# Patient Record
Sex: Female | Born: 1950 | Race: Black or African American | Hispanic: No | Marital: Single | Smoking: Former smoker
Health system: Southern US, Community
[De-identification: ages and names within clinical notes are randomized; demographics above are authoritative.]

## PROBLEM LIST (undated history)

## (undated) DIAGNOSIS — R0602 Shortness of breath: Secondary | ICD-10-CM

## (undated) DIAGNOSIS — Z87440 Personal history of urinary (tract) infections: Secondary | ICD-10-CM

## (undated) DIAGNOSIS — Z950 Presence of cardiac pacemaker: Secondary | ICD-10-CM

## (undated) DIAGNOSIS — J449 Chronic obstructive pulmonary disease, unspecified: Secondary | ICD-10-CM

## (undated) DIAGNOSIS — R42 Dizziness and giddiness: Secondary | ICD-10-CM

## (undated) DIAGNOSIS — I509 Heart failure, unspecified: Secondary | ICD-10-CM

## (undated) DIAGNOSIS — I10 Essential (primary) hypertension: Secondary | ICD-10-CM

## (undated) DIAGNOSIS — M545 Low back pain, unspecified: Secondary | ICD-10-CM

## (undated) DIAGNOSIS — G8929 Other chronic pain: Secondary | ICD-10-CM

## (undated) DIAGNOSIS — Z531 Procedure and treatment not carried out because of patient's decision for reasons of belief and group pressure: Secondary | ICD-10-CM

## (undated) DIAGNOSIS — R51 Headache: Secondary | ICD-10-CM

## (undated) DIAGNOSIS — K5900 Constipation, unspecified: Secondary | ICD-10-CM

## (undated) DIAGNOSIS — J42 Unspecified chronic bronchitis: Secondary | ICD-10-CM

## (undated) DIAGNOSIS — I499 Cardiac arrhythmia, unspecified: Secondary | ICD-10-CM

## (undated) DIAGNOSIS — IMO0001 Reserved for inherently not codable concepts without codable children: Secondary | ICD-10-CM

## (undated) DIAGNOSIS — Z95810 Presence of automatic (implantable) cardiac defibrillator: Secondary | ICD-10-CM

## (undated) DIAGNOSIS — M1990 Unspecified osteoarthritis, unspecified site: Secondary | ICD-10-CM

## (undated) DIAGNOSIS — I209 Angina pectoris, unspecified: Secondary | ICD-10-CM

## (undated) HISTORY — PX: TUBAL LIGATION: SHX77

## (undated) HISTORY — PX: JOINT REPLACEMENT: SHX530

---

## 1989-04-28 HISTORY — PX: REPLACEMENT TOTAL KNEE: SUR1224

## 2007-05-29 HISTORY — PX: INSERT / REPLACE / REMOVE PACEMAKER: SUR710

## 2011-09-12 ENCOUNTER — Encounter (HOSPITAL_COMMUNITY): Payer: Self-pay | Admitting: *Deleted

## 2011-09-12 ENCOUNTER — Emergency Department (HOSPITAL_COMMUNITY): Payer: Medicaid Other

## 2011-09-12 ENCOUNTER — Other Ambulatory Visit: Payer: Self-pay

## 2011-09-12 ENCOUNTER — Inpatient Hospital Stay (HOSPITAL_COMMUNITY)
Admission: EM | Admit: 2011-09-12 | Discharge: 2011-09-14 | DRG: 552 | Disposition: A | Discharge status: Home / Self Care | Payer: Medicaid Other | Attending: Infectious Diseases | Admitting: Infectious Diseases

## 2011-09-12 DIAGNOSIS — N179 Acute kidney failure, unspecified: Secondary | ICD-10-CM | POA: Diagnosis present

## 2011-09-12 DIAGNOSIS — J449 Chronic obstructive pulmonary disease, unspecified: Secondary | ICD-10-CM

## 2011-09-12 DIAGNOSIS — I420 Dilated cardiomyopathy: Secondary | ICD-10-CM

## 2011-09-12 DIAGNOSIS — G8929 Other chronic pain: Secondary | ICD-10-CM | POA: Diagnosis present

## 2011-09-12 DIAGNOSIS — M109 Gout, unspecified: Secondary | ICD-10-CM | POA: Diagnosis present

## 2011-09-12 DIAGNOSIS — I499 Cardiac arrhythmia, unspecified: Secondary | ICD-10-CM

## 2011-09-12 DIAGNOSIS — Z79899 Other long term (current) drug therapy: Secondary | ICD-10-CM

## 2011-09-12 DIAGNOSIS — Z950 Presence of cardiac pacemaker: Secondary | ICD-10-CM

## 2011-09-12 DIAGNOSIS — M545 Low back pain, unspecified: Secondary | ICD-10-CM | POA: Diagnosis present

## 2011-09-12 DIAGNOSIS — M5380 Other specified dorsopathies, site unspecified: Principal | ICD-10-CM | POA: Diagnosis present

## 2011-09-12 DIAGNOSIS — R079 Chest pain, unspecified: Secondary | ICD-10-CM | POA: Diagnosis present

## 2011-09-12 DIAGNOSIS — M542 Cervicalgia: Secondary | ICD-10-CM | POA: Diagnosis present

## 2011-09-12 DIAGNOSIS — Z96659 Presence of unspecified artificial knee joint: Secondary | ICD-10-CM

## 2011-09-12 DIAGNOSIS — E860 Dehydration: Secondary | ICD-10-CM | POA: Diagnosis present

## 2011-09-12 DIAGNOSIS — I509 Heart failure, unspecified: Secondary | ICD-10-CM | POA: Diagnosis present

## 2011-09-12 DIAGNOSIS — Z7982 Long term (current) use of aspirin: Secondary | ICD-10-CM

## 2011-09-12 DIAGNOSIS — R000 Tachycardia, unspecified: Secondary | ICD-10-CM

## 2011-09-12 HISTORY — DX: Chronic obstructive pulmonary disease, unspecified: J44.9

## 2011-09-12 HISTORY — DX: Cardiac arrhythmia, unspecified: I49.9

## 2011-09-12 HISTORY — DX: Headache: R51

## 2011-09-12 HISTORY — DX: Reserved for inherently not codable concepts without codable children: IMO0001

## 2011-09-12 HISTORY — DX: Heart failure, unspecified: I50.9

## 2011-09-12 HISTORY — DX: Low back pain: M54.5

## 2011-09-12 HISTORY — DX: Angina pectoris, unspecified: I20.9

## 2011-09-12 HISTORY — DX: Procedure and treatment not carried out because of patient's decision for reasons of belief and group pressure: Z53.1

## 2011-09-12 HISTORY — DX: Dizziness and giddiness: R42

## 2011-09-12 HISTORY — DX: Other chronic pain: G89.29

## 2011-09-12 HISTORY — DX: Unspecified chronic bronchitis: J42

## 2011-09-12 HISTORY — DX: Presence of cardiac pacemaker: Z95.0

## 2011-09-12 HISTORY — DX: Shortness of breath: R06.02

## 2011-09-12 HISTORY — DX: Essential (primary) hypertension: I10

## 2011-09-12 HISTORY — DX: Presence of automatic (implantable) cardiac defibrillator: Z95.810

## 2011-09-12 HISTORY — DX: Low back pain, unspecified: M54.50

## 2011-09-12 HISTORY — DX: Personal history of urinary (tract) infections: Z87.440

## 2011-09-12 LAB — BASIC METABOLIC PANEL
BUN: 32 mg/dL — ABNORMAL HIGH (ref 6–23)
Creatinine, Ser: 2.04 mg/dL — ABNORMAL HIGH (ref 0.50–1.10)
GFR calc Af Amer: 30 mL/min — ABNORMAL LOW (ref >90)
GFR calc non Af Amer: 26 mL/min — ABNORMAL LOW (ref >90)

## 2011-09-12 LAB — DIFFERENTIAL
Basophils Relative: 1 % (ref 0–1)
Eosinophils Absolute: 0.3 10*3/uL (ref 0.0–0.7)
Lymphocytes Relative: 26 % (ref 12–46)
Monocytes Absolute: 0.8 10*3/uL (ref 0.1–1.0)
Monocytes Relative: 10 % (ref 3–12)
Neutro Abs: 4.9 10*3/uL (ref 1.7–7.7)
Neutrophils Relative %: 59 % (ref 43–77)

## 2011-09-12 LAB — CBC
MCV: 77.2 fL — ABNORMAL LOW (ref 78.0–100.0)
Platelets: 240 10*3/uL (ref 150–400)
RBC: 5.08 MIL/uL (ref 3.87–5.11)
RDW: 16.3 % — ABNORMAL HIGH (ref 11.5–15.5)
WBC: 8.3 10*3/uL (ref 4.0–10.5)

## 2011-09-12 LAB — CK TOTAL AND CKMB (NOT AT ARMC): CK, MB: 1.2 ng/mL (ref 0.3–4.0)

## 2011-09-12 MED ORDER — SODIUM CHLORIDE 0.9 % IV SOLN: INTRAVENOUS | Status: DC

## 2011-09-12 MED ORDER — ASPIRIN EC 81 MG PO TBEC
81.0000 mg | DELAYED_RELEASE_TABLET | Freq: Every day | ORAL | Status: DC
  Administered 2011-09-12 – 2011-09-14 (×3): 81 mg via ORAL
  Filled 2011-09-12 (×3): qty 1

## 2011-09-12 MED ORDER — ALBUTEROL SULFATE (5 MG/ML) 0.5% IN NEBU
5.0000 mg | INHALATION_SOLUTION | Freq: Once | RESPIRATORY_TRACT | Status: AC
  Administered 2011-09-12: 5 mg via RESPIRATORY_TRACT
  Filled 2011-09-12: qty 1

## 2011-09-12 MED ORDER — ONDANSETRON HCL 4 MG PO TABS: 4.0000 mg | ORAL_TABLET | Freq: Four times a day (QID) | ORAL | Status: DC | PRN

## 2011-09-12 MED ORDER — METOPROLOL SUCCINATE ER 100 MG PO TB24
200.0000 mg | ORAL_TABLET | Freq: Every day | ORAL | Status: DC
  Administered 2011-09-12 – 2011-09-13 (×2): 200 mg via ORAL
  Filled 2011-09-12 (×3): qty 2

## 2011-09-12 MED ORDER — LEVALBUTEROL HCL 1.25 MG/3ML IN NEBU
1.2500 mg | INHALATION_SOLUTION | RESPIRATORY_TRACT | Status: DC | PRN
  Filled 2011-09-12 (×2): qty 3

## 2011-09-12 MED ORDER — ENOXAPARIN SODIUM 30 MG/0.3ML ~~LOC~~ SOLN
30.0000 mg | SUBCUTANEOUS | Status: DC
  Administered 2011-09-12: 30 mg via SUBCUTANEOUS
  Filled 2011-09-12 (×2): qty 0.3

## 2011-09-12 MED ORDER — PANTOPRAZOLE SODIUM 40 MG PO TBEC
40.0000 mg | DELAYED_RELEASE_TABLET | Freq: Two times a day (BID) | ORAL | Status: DC
  Administered 2011-09-13 – 2011-09-14 (×3): 40 mg via ORAL
  Filled 2011-09-12 (×3): qty 1

## 2011-09-12 MED ORDER — ONDANSETRON HCL 4 MG/2ML IJ SOLN: 4.0000 mg | Freq: Four times a day (QID) | INTRAMUSCULAR | Status: DC | PRN

## 2011-09-12 MED ORDER — ACETAMINOPHEN 650 MG RE SUPP: 650.0000 mg | Freq: Four times a day (QID) | RECTAL | Status: DC | PRN

## 2011-09-12 MED ORDER — SODIUM CHLORIDE 0.9 % IV BOLUS (SEPSIS): 500.0000 mL | Freq: Once | INTRAVENOUS | Status: DC

## 2011-09-12 MED ORDER — SODIUM CHLORIDE 0.9 % IV BOLUS (SEPSIS)
500.0000 mL | Freq: Once | INTRAVENOUS | Status: AC
  Administered 2011-09-12: 500 mL via INTRAVENOUS

## 2011-09-12 MED ORDER — IPRATROPIUM BROMIDE 0.02 % IN SOLN
0.5000 mg | Freq: Once | RESPIRATORY_TRACT | Status: AC
  Administered 2011-09-12: 0.5 mg via RESPIRATORY_TRACT
  Filled 2011-09-12: qty 2.5

## 2011-09-12 MED ORDER — METHYLPREDNISOLONE SODIUM SUCC 125 MG IJ SOLR
125.0000 mg | Freq: Once | INTRAMUSCULAR | Status: AC
  Administered 2011-09-12: 125 mg via INTRAVENOUS
  Filled 2011-09-12: qty 2

## 2011-09-12 MED ORDER — SODIUM CHLORIDE 0.9 % IV SOLN
INTRAVENOUS | Status: AC
  Administered 2011-09-12: 23:00:00 via INTRAVENOUS

## 2011-09-12 MED ORDER — ALLOPURINOL 100 MG PO TABS
100.0000 mg | ORAL_TABLET | Freq: Every day | ORAL | Status: DC
  Administered 2011-09-12 – 2011-09-14 (×3): 100 mg via ORAL
  Filled 2011-09-12 (×3): qty 1

## 2011-09-12 MED ORDER — ASPIRIN 81 MG PO CHEW
324.0000 mg | CHEWABLE_TABLET | Freq: Once | ORAL | Status: AC
  Administered 2011-09-12: 324 mg via ORAL
  Filled 2011-09-12: qty 4

## 2011-09-12 MED ORDER — OXYCODONE-ACETAMINOPHEN 5-325 MG PO TABS
1.0000 | ORAL_TABLET | Freq: Once | ORAL | Status: AC
  Administered 2011-09-12: 1 via ORAL
  Filled 2011-09-12: qty 1

## 2011-09-12 MED ORDER — ACETAMINOPHEN 325 MG PO TABS
650.0000 mg | ORAL_TABLET | Freq: Four times a day (QID) | ORAL | Status: DC | PRN
  Administered 2011-09-13 – 2011-09-14 (×4): 650 mg via ORAL
  Filled 2011-09-12 (×4): qty 2

## 2011-09-12 NOTE — ED Notes (Signed)
Spoke with Respiratory will administer treatment.

## 2011-09-12 NOTE — H&P (Signed)
Hospital Admission Note Date: 09/12/2011  Patient name: Monique Kane Medical record number: [id number] Date of birth: [birth year] Age: 61 y.o. Gender: female PCP: No primary provider on file.  Medical Service:  Attending physician: Dr Darlina Sicilian    1st Contact: Dr Margorie John  Pager: [phone] 2nd Contact: Dr Lyn Hollingshead   Pager: [phone] After 5 pm or weekends: 1st Contact:      Pager: [phone] 2nd Contact:      Pager: [phone]  Chief Complaint: back pain and neck pain  History of Present Illness: Patient is a 61 yo woman with a history of COPD and CHF with pacemaker who present today complaining of worsening back and neck pain for 1 week.  Patient says she has been feeling poorly for about 2 weeks at first having congestion, some cough, and decreased appetite similar to the grandkids who live at home with her.  However, 7 days ago she woke up and got out of bed quickly with a sharp pain in her middle upper back she describes as sharp and 10/10 on a pain scale.  She is part of CHF care program in which she takes her blood pressure and heart rate every morning and she reports these were 96/50's and her heart rate was high.  She says this pain is worse with movement or deep breaths and best when laying flat and still.  Also improves with aspirin and tylenol arthritis.  Patient says the pain is similar when she had pneumonia about a year ago.  Patient says her shortness of breath has not worsened from baseline and that her cough and congestion have improved.  Patient says she is only short of breath with walking and uses her nebulizer 2x/day and rescue inhaler 3-4x/day per cardiology recommendations.  Unclear what her baseline dyspnea and previous inhaler use was though current dosing appears to be a recent increase.  Her back pain has worsened and spread to include her neck and wraps around to her sides as well and limits her movement of her head.  The pain has also begun making it difficult to get  around and she had to begin using a walker and having to take breaks because of the pain, which is what finally prompted her to seek medical care.  She also reports her weight is down to 277 from 290 2 weeks ago.  Of lesser concern to the patient is the headache she had been having in addition to the neck pain.  Patient expressed concern about meningitis because of difficulty turning head because her daughter died of meningitis.  Patient denies vision changes, GI symptoms, however, she did report some lightheadedness with standing too quickly.  In addition to the low BP reading, she also reports decreased PO intake, especially of fluids (8oz of water and 2-3 glasses of juice per day, cardiology limits intake to 64 oz/day).      Meds:  .pta Medications Prior to Admission  Medication Dose Route Frequency Provider Last Rate Last Dose   Metoprolol 200 mg po       Allopurinol 100 mg po       Aspirin  81 mg po daily      Furosemide 20 mg po       Levalbuterol  nebulizer 2x/day      Omeprazole 20 mg po       Spironolactone 50 mg po       Valsartan 160 mg po       Tiotropium 18 mcg inhaled  4x/day        Allergies: Review of patient's allergies indicates no known allergies. Past Medical History  Diagnosis Date  . CHF (congestive heart failure)-pacemaker since 05/2007   . Hypertension   . COPD (chronic obstructive pulmonary disease)   . Chronic lower back pain   . Hx Gout    Hx PNA about 1 year ago (2012/2011)   . Hx: UTI (urinary tract infection)    Followed by Dr. Daphine Deutscher at [facility] in [facility].  Past Surgical History  Procedure Date  . Replacement total knee 2/2 fracture     left  . Insert pacemaker 05/2007   No family history on file. History   Social History  . Marital Status: Single, Divorced    Number of Children: 6 biologic, adopted 5     Occupational History  . Worked as a Advertising copywriter for a number of years, retired since 2004   . Smoking status: former  smoker, quit 2007    Types:cigarettes, 1/2 pack per day x 15 years  . Alcohol Use: rarely drinks, less than 1x/month  . Drug AVW:[id number] any   Social History Narrative  . Lives in [place] with daughter and adopted 5 grandchildren ages 53-15.  Originally from [place], [place].     Review of Systems: Pertinent items are noted in HPI.  Physical Exam: Blood pressure 108/57, pulse 111, temperature 97.9 F (36.6 C), temperature source Oral, resp. rate 18, SpO2 96.00%. BP 108/57  Pulse 111  Temp(Src) 97.9 F (36.6 C) (Oral)  Resp 18  SpO2 96% General appearance: alert, cooperative, appears stated age and moderately obese Throat: lips, mucosa, and tongue normal; teeth and gums normal Lungs: clear to auscultation bilaterally and poor inspiratory effort and distant breath sounds likely 2/2 to obese body habitus Heart: no abnormal sounds appreciated, however, heart sounds muffled diffusely 2/2 obese body habitus Abdomen: soft, non-tender; bowel sounds normal; no masses,  no organomegaly Extremities: extremities normal, atraumatic, no cyanosis or edema Pulses: 2+ and symmetric  Lab results: Admission on 09/12/2011  Component Date Value Range Status  . WBC (K/uL) 09/12/2011 8.3  4.0-10.5 Final  . RBC (MIL/uL) 09/12/2011 5.08  3.87-5.11 Final  . Hemoglobin (g/dL) 14/78/2956 21.3  08.6-57.8 Final  . HCT (%) 09/12/2011 39.2  36.0-46.0 Final  . MCV (fL) 09/12/2011 77.2* 78.0-100.0 Final  . MCH (pg) 09/12/2011 26.2  26.0-34.0 Final  . MCHC (g/dL) 46/96/2952 84.1  32.4-40.1 Final  . RDW (%) 09/12/2011 16.3* 11.5-15.5 Final  . Platelets (K/uL) 09/12/2011 240  150-400 Final  . Neutrophils Relative (%) 09/12/2011 59  43-77 Final  . Neutro Abs (K/uL) 09/12/2011 4.9  1.7-7.7 Final  . Lymphocytes Relative (%) 09/12/2011 26  12-46 Final  . Lymphs Abs (K/uL) 09/12/2011 2.2  0.7-4.0 Final  . Monocytes Relative (%) 09/12/2011 10  3-12 Final  . Monocytes Absolute (K/uL) 09/12/2011 0.8  0.1-1.0 Final    . Eosinophils Relative (%) 09/12/2011 4  0-5 Final  . Eosinophils Absolute (K/uL) 09/12/2011 0.3  0.0-0.7 Final  . Basophils Relative (%) 09/12/2011 1  0-1 Final  . Basophils Absolute (K/uL) 09/12/2011 0.0  0.0-0.1 Final  . Sodium (mEq/L) 09/12/2011 136  135-145 Final  . Potassium (mEq/L) 09/12/2011 3.8  3.5-5.1 Final  . Chloride (mEq/L) 09/12/2011 94* 96-112 Final  . CO2 (mEq/L) 09/12/2011 26  19-32 Final  . Glucose, Bld (mg/dL) 02/72/5366 440* 34-74 Final  . BUN (mg/dL) 25/95/6387 32* 5-64 Final  . Creatinine, Ser (mg/dL) 33/29/5188 4.16* 6.06-3.01 Final  .  Calcium (mg/dL) 16/05/9603 54.0  9.8-11.9 Final  . GFR calc non Af Amer (mL/min) 09/12/2011 26* >90 Final  . GFR calc Af Amer (mL/min) 09/12/2011 30* >90 Final  . Troponin i, poc (ng/mL) 09/12/2011 0.01  0.00-0.08 Final  . Prothrombin Time (seconds) 09/12/2011 14.3  11.6-15.2 Final  . INR  09/12/2011 1.09  0.00-1.49 Final  . aPTT (seconds) 09/12/2011 29  24-37 Final  . D-Dimer, Quant (ug/mL-FEU) 09/12/2011 0.54* 0.00-0.48 Final  . Total CK (U/L) 09/12/2011 118  7-177 Final  . CK, MB (ng/mL) 09/12/2011 1.2  0.3-4.0 Final  . Relative Index  09/12/2011 1.0  0.0-2.5 Final  . Troponin i, poc (ng/mL) 09/12/2011 0.00  0.00-0.08 Final     Imaging results:  Dg Chest 2 View  09/12/2011  *RADIOLOGY REPORT*  Clinical Data: Chest pain, shortness of breath, and cough. Weakness.  CHEST - 2 VIEW  Comparison: None.  Findings: AICD in place.  Heart size and vascularity are normal. Slight linear atelectasis or scarring in both lung bases.  No effusions.  No acute osseous abnormality.  Slight peribronchial thickening which could be acute or chronic bronchitis.  IMPRESSION: Acute or chronic bronchitic changes. Minimal atelectasis or scarring at the bases.  Original Report Authenticated By: Gwynn Burly, M.D.    Other results: EKG: Cannot be assessed as patient has ventricular pacemaker.  Will collect outside records and attempt to compare to  previous tracings.  A&P:  1) Atypical chest pain and back pain: Likely from musculoskeletal origin. ACS is on differential due to history of CHF- but seems unlikely from history. Dehydration likely an exacerbating factor.  WBC and electrolytes within normal limits.  Plan: Admit to telemetry bed. - Cycle cardiac enzymes. - Get records from primary care in [facility]. -12-lead EKG in a.m.   2) Acute kidney injury: Creatinine 2.04. Baseline not available. Likely prerenal from dehydration.  Plan: Controlled hydration due to history of CHF. - Will need EF before we start aggressive hydration. - FeNa would be unreliable as patient on Lasix.  3) Congestive heart failure:  Unknown etiology at this point of time due to unavailability of records. Chest x-ray does not show no pulmonary edema and patient does not have clinical volume overload- indicating worsening CHF.  Plan: Get records. - Hold Lasix, spironolactone, ARB. - Continue metoprolol.  4) DVT prophylaxis: Lovenox.      Signed: Margorie John, MD PGY1, Internal Medicine Resident.  Internal Medicine Teaching Service Attending Note Date: 09/13/2011  Patient name: Monique Kane  Medical record number: [id number]  Date of birth: 10/05/1950   I have seen and evaluated Prescott Parma and discussed their care with the Residency Team.   Physical Exam: Blood pressure 101/71, pulse 97, temperature 97.4 F (36.3 C), temperature source Oral, resp. rate 22, height 5\' 3"  (1.6 m), weight 277 lb (125.646 kg), SpO2 97.00%.   Lab results: Results for orders placed during the hospital encounter of 09/12/11 (from the past 24 hour(s))  CBC     Status: Abnormal   Collection Time   09/12/11 11:37 AM      Component Value Range   WBC 8.3  4.0 - 10.5 (K/uL)   RBC 5.08  3.87 - 5.11 (MIL/uL)   Hemoglobin 13.3  12.0 - 15.0 (g/dL)   HCT 13.0  86.5 - 78.4 (%)   MCV 77.2 (*) 78.0 - 100.0 (fL)   MCH 26.2  26.0 - 34.0 (pg)   MCHC  33.9  30.0 - 36.0 (g/dL)   RDW  16.3 (*) 11.5 - 15.5 (%)   Platelets 240  150 - 400 (K/uL)  DIFFERENTIAL     Status: Normal   Collection Time   09/12/11 11:37 AM      Component Value Range   Neutrophils Relative 59  43 - 77 (%)   Neutro Abs 4.9  1.7 - 7.7 (K/uL)   Lymphocytes Relative 26  12 - 46 (%)   Lymphs Abs 2.2  0.7 - 4.0 (K/uL)   Monocytes Relative 10  3 - 12 (%)   Monocytes Absolute 0.8  0.1 - 1.0 (K/uL)   Eosinophils Relative 4  0 - 5 (%)   Eosinophils Absolute 0.3  0.0 - 0.7 (K/uL)   Basophils Relative 1  0 - 1 (%)   Basophils Absolute 0.0  0.0 - 0.1 (K/uL)  BASIC METABOLIC PANEL     Status: Abnormal   Collection Time   09/12/11 11:37 AM      Component Value Range   Sodium 136  135 - 145 (mEq/L)   Potassium 3.8  3.5 - 5.1 (mEq/L)   Chloride 94 (*) 96 - 112 (mEq/L)   CO2 26  19 - 32 (mEq/L)   Glucose, Bld 112 (*) 70 - 99 (mg/dL)   BUN 32 (*) 6 - 23 (mg/dL)   Creatinine, Ser 1.61 (*) 0.50 - 1.10 (mg/dL)   Calcium 09.6  8.4 - 10.5 (mg/dL)   GFR calc non Af Amer 26 (*) >90 (mL/min)   GFR calc Af Amer 30 (*) >90 (mL/min)  POCT I-STAT TROPONIN I     Status: Normal   Collection Time   09/12/11 12:03 PM      Component Value Range   Troponin i, poc 0.01  0.00 - 0.08 (ng/mL)   Comment 3           PROTIME-INR     Status: Normal   Collection Time   09/12/11  2:46 PM      Component Value Range   Prothrombin Time 14.3  11.6 - 15.2 (seconds)   INR 1.09  0.00 - 1.49   APTT     Status: Normal   Collection Time   09/12/11  2:46 PM      Component Value Range   aPTT 29  24 - 37 (seconds)  D-DIMER, QUANTITATIVE     Status: Abnormal   Collection Time   09/12/11  2:46 PM      Component Value Range   D-Dimer, Quant 0.54 (*) 0.00 - 0.48 (ug/mL-FEU)  CK TOTAL AND CKMB     Status: Normal   Collection Time   09/12/11  4:53 PM      Component Value Range   Total CK 118  7 - 177 (U/L)   CK, MB 1.2  0.3 - 4.0 (ng/mL)   Relative Index 1.0  0.0 - 2.5   POCT I-STAT TROPONIN I     Status:  Normal   Collection Time   09/12/11  5:04 PM      Component Value Range   Troponin i, poc 0.00  0.00 - 0.08 (ng/mL)   Comment 3           CARDIAC PANEL(CRET KIN+CKTOT+MB+TROPI)     Status: Normal   Collection Time   09/12/11  8:37 PM      Component Value Range   Total CK 122  7 - 177 (U/L)   CK, MB 1.4  0.3 - 4.0 (ng/mL)   Troponin I <0.30  <0.30 (ng/mL)  Relative Index 1.1  0.0 - 2.5   COMPREHENSIVE METABOLIC PANEL     Status: Abnormal   Collection Time   09/13/11  2:38 AM      Component Value Range   Sodium 133 (*) 135 - 145 (mEq/L)   Potassium 4.0  3.5 - 5.1 (mEq/L)   Chloride 97  96 - 112 (mEq/L)   CO2 23  19 - 32 (mEq/L)   Glucose, Bld 148 (*) 70 - 99 (mg/dL)   BUN 33 (*) 6 - 23 (mg/dL)   Creatinine, Ser 1.61 (*) 0.50 - 1.10 (mg/dL)   Calcium 9.3  8.4 - 09.6 (mg/dL)   Total Protein 7.8  6.0 - 8.3 (g/dL)   Albumin 3.3 (*) 3.5 - 5.2 (g/dL)   AST 17  0 - 37 (U/L)   ALT 15  0 - 35 (U/L)   Alkaline Phosphatase 100  39 - 117 (U/L)   Total Bilirubin 0.2 (*) 0.3 - 1.2 (mg/dL)   GFR calc non Af Amer 29 (*) >90 (mL/min)   GFR calc Af Amer 33 (*) >90 (mL/min)  CARDIAC PANEL(CRET KIN+CKTOT+MB+TROPI)     Status: Normal   Collection Time   09/13/11  2:38 AM      Component Value Range   Total CK 132  7 - 177 (U/L)   CK, MB 1.5  0.3 - 4.0 (ng/mL)   Troponin I <0.30  <0.30 (ng/mL)   Relative Index 1.1  0.0 - 2.5     Imaging results:  Dg Chest 2 View  09/12/2011  *RADIOLOGY REPORT*  Clinical Data: Chest pain, shortness of breath, and cough. Weakness.  CHEST - 2 VIEW  Comparison: None.  Findings: AICD in place.  Heart size and vascularity are normal. Slight linear atelectasis or scarring in both lung bases.  No effusions.  No acute osseous abnormality.  Slight peribronchial thickening which could be acute or chronic bronchitis.  IMPRESSION: Acute or chronic bronchitic changes. Minimal atelectasis or scarring at the bases.  Original Report Authenticated By: Gwynn Burly, M.D.     Assessment and Plan: I agree with the formulated Assessment and Plan with the following changes: Patient seen and examined.  Ms. Asquith has musculoskeletal pain in midscapular region but not clearly of pulmonary origin and do not feel that she has PE or MI. There are no neurologic findings to suggest cervical root nerve compression or acute disc hernistion. Agree treating as limited muscle strain and possibly form her coughing with URI symptoms the week before admission. Exam and chest x ray do not suggest CHF although she has history of CHF and has had an LV pacemaker and has cardiomegaly. Old records from her PCPs in [facility] will be helpful. Lastly her elevated creatinine and AG suggests chronic azotemia and old records again helpful.     Suggest calling  PCP office in [facility] and getting key records faxed to day or to talk with her PCP.  Lina Sayre Internal Medicine Teaching Service Attending Note Date: 09/13/2011  Patient name: Marshawn Ninneman  Medical record number: [id number]  Date of birth: 1950/12/16   I have seen and evaluated Prescott Parma and discussed their care with the Residency Team.    Physical Exam: Blood pressure 101/71, pulse 97, temperature 97.4 F (36.3 C), temperature source Oral, resp. rate 22, height 5\' 3"  (1.6 m), weight 277 lb (125.646 kg), SpO2 97.00%.   Lab results: Results for orders placed during the hospital encounter of 09/12/11 (from the past 24  hour(s))  CBC     Status: Abnormal   Collection Time   09/12/11 11:37 AM      Component Value Range   WBC 8.3  4.0 - 10.5 (K/uL)   RBC 5.08  3.87 - 5.11 (MIL/uL)   Hemoglobin 13.3  12.0 - 15.0 (g/dL)   HCT 95.6  21.3 - 08.6 (%)   MCV 77.2 (*) 78.0 - 100.0 (fL)   MCH 26.2  26.0 - 34.0 (pg)   MCHC 33.9  30.0 - 36.0 (g/dL)   RDW 57.8 (*) 46.9 - 15.5 (%)   Platelets 240  150 - 400 (K/uL)  DIFFERENTIAL     Status: Normal   Collection Time   09/12/11 11:37 AM      Component Value Range    Neutrophils Relative 59  43 - 77 (%)   Neutro Abs 4.9  1.7 - 7.7 (K/uL)   Lymphocytes Relative 26  12 - 46 (%)   Lymphs Abs 2.2  0.7 - 4.0 (K/uL)   Monocytes Relative 10  3 - 12 (%)   Monocytes Absolute 0.8  0.1 - 1.0 (K/uL)   Eosinophils Relative 4  0 - 5 (%)   Eosinophils Absolute 0.3  0.0 - 0.7 (K/uL)   Basophils Relative 1  0 - 1 (%)   Basophils Absolute 0.0  0.0 - 0.1 (K/uL)  BASIC METABOLIC PANEL     Status: Abnormal   Collection Time   09/12/11 11:37 AM      Component Value Range   Sodium 136  135 - 145 (mEq/L)   Potassium 3.8  3.5 - 5.1 (mEq/L)   Chloride 94 (*) 96 - 112 (mEq/L)   CO2 26  19 - 32 (mEq/L)   Glucose, Bld 112 (*) 70 - 99 (mg/dL)   BUN 32 (*) 6 - 23 (mg/dL)   Creatinine, Ser 6.29 (*) 0.50 - 1.10 (mg/dL)   Calcium 52.8  8.4 - 10.5 (mg/dL)   GFR calc non Af Amer 26 (*) >90 (mL/min)   GFR calc Af Amer 30 (*) >90 (mL/min)  POCT I-STAT TROPONIN I     Status: Normal   Collection Time   09/12/11 12:03 PM      Component Value Range   Troponin i, poc 0.01  0.00 - 0.08 (ng/mL)   Comment 3           PROTIME-INR     Status: Normal   Collection Time   09/12/11  2:46 PM      Component Value Range   Prothrombin Time 14.3  11.6 - 15.2 (seconds)   INR 1.09  0.00 - 1.49   APTT     Status: Normal   Collection Time   09/12/11  2:46 PM      Component Value Range   aPTT 29  24 - 37 (seconds)  D-DIMER, QUANTITATIVE     Status: Abnormal   Collection Time   09/12/11  2:46 PM      Component Value Range   D-Dimer, Quant 0.54 (*) 0.00 - 0.48 (ug/mL-FEU)  CK TOTAL AND CKMB     Status: Normal   Collection Time   09/12/11  4:53 PM      Component Value Range   Total CK 118  7 - 177 (U/L)   CK, MB 1.2  0.3 - 4.0 (ng/mL)   Relative Index 1.0  0.0 - 2.5   POCT I-STAT TROPONIN I     Status: Normal   Collection Time  09/12/11  5:04 PM      Component Value Range   Troponin i, poc 0.00  0.00 - 0.08 (ng/mL)   Comment 3           CARDIAC PANEL(CRET KIN+CKTOT+MB+TROPI)     Status:  Normal   Collection Time   09/12/11  8:37 PM      Component Value Range   Total CK 122  7 - 177 (U/L)   CK, MB 1.4  0.3 - 4.0 (ng/mL)   Troponin I <0.30  <0.30 (ng/mL)   Relative Index 1.1  0.0 - 2.5   COMPREHENSIVE METABOLIC PANEL     Status: Abnormal   Collection Time   09/13/11  2:38 AM      Component Value Range   Sodium 133 (*) 135 - 145 (mEq/L)   Potassium 4.0  3.5 - 5.1 (mEq/L)   Chloride 97  96 - 112 (mEq/L)   CO2 23  19 - 32 (mEq/L)   Glucose, Bld 148 (*) 70 - 99 (mg/dL)   BUN 33 (*) 6 - 23 (mg/dL)   Creatinine, Ser 1.61 (*) 0.50 - 1.10 (mg/dL)   Calcium 9.3  8.4 - 09.6 (mg/dL)   Total Protein 7.8  6.0 - 8.3 (g/dL)   Albumin 3.3 (*) 3.5 - 5.2 (g/dL)   AST 17  0 - 37 (U/L)   ALT 15  0 - 35 (U/L)   Alkaline Phosphatase 100  39 - 117 (U/L)   Total Bilirubin 0.2 (*) 0.3 - 1.2 (mg/dL)   GFR calc non Af Amer 29 (*) >90 (mL/min)   GFR calc Af Amer 33 (*) >90 (mL/min)  CARDIAC PANEL(CRET KIN+CKTOT+MB+TROPI)     Status: Normal   Collection Time   09/13/11  2:38 AM      Component Value Range   Total CK 132  7 - 177 (U/L)   CK, MB 1.5  0.3 - 4.0 (ng/mL)   Troponin I <0.30  <0.30 (ng/mL)   Relative Index 1.1  0.0 - 2.5     Imaging results:  Dg Chest 2 View  09/12/2011  *RADIOLOGY REPORT*  Clinical Data: Chest pain, shortness of breath, and cough. Weakness.  CHEST - 2 VIEW  Comparison: None.  Findings: AICD in place.  Heart size and vascularity are normal. Slight linear atelectasis or scarring in both lung bases.  No effusions.  No acute osseous abnormality.  Slight peribronchial thickening which could be acute or chronic bronchitis.  IMPRESSION: Acute or chronic bronchitic changes. Minimal atelectasis or scarring at the bases.  Original Report Authenticated By: Gwynn Burly, M.D.    Assessment and Plan: I agree with the formulated Assessment and Plan with the following changes:Major issues are to get history of CHF and whether she has pre-existing renal disease or not. Old  records will be key and suggest calling PCP. Will follow creatinine with judiscious hydration. Lina Sayre

## 2011-09-12 NOTE — ED Notes (Signed)
Patient resting with family at bedside. Patient remains on monitor and 2L oxygen with saturation of 96.

## 2011-09-12 NOTE — ED Notes (Signed)
IV Team notified that IV is needed due to infiltration of Left wrist IV.

## 2011-09-12 NOTE — ED Notes (Signed)
Cancelled IV Team. Dr Rosalia Hammers gained IV access.

## 2011-09-12 NOTE — ED Provider Notes (Signed)
History     CSN: [id number]  Arrival date & time 09/12/11  1020   First MD Initiated Contact with Patient 09/12/11 1127      Chief Complaint  Patient presents with  . Chest Pain  . Back Pain    neck stiff    (Consider location/radiation/quality/duration/timing/severity/associated sxs/prior treatment) HPI Chest pain and back pain for a week.  Present constantly with increase with ambulation.  Pain began in upper back between shoulder blades and radiates up to neck and feels stiff and radiates bilaterally under both breasts.  Had some fever, last during weekend subjective.  Patient with cough nonproductive. Patient is dyspneic.  Patient on 2lm oxygen at night.  PMD is out of town, cardiologist in [facility] s/p pacemaker.   Past Medical History  Diagnosis Date  . CHF (congestive heart failure)   . Hypertension   . COPD (chronic obstructive pulmonary disease)     Past Surgical History  Procedure Date  . Replacement total knee     left  . Pacemaker insertion     No family history on file.  History  Substance Use Topics  . Smoking status: Former Games developer  . Smokeless tobacco: Not on file  . Alcohol Use: No    OB History    Grav Para Term Preterm Abortions TAB SAB Ect Mult Living                  Review of Systems  All other systems reviewed and are negative.    Allergies  Review of patient's allergies indicates no known allergies.  Home Medications   Current Outpatient Rx  Name Route Sig Dispense Refill  . ALLOPURINOL 100 MG PO TABS Oral Take 100 mg by mouth daily.    . ASPIRIN EC 81 MG PO TBEC Oral Take 81 mg by mouth daily.    . FUROSEMIDE 20 MG PO TABS Oral Take 20 mg by mouth 2 (two) times daily.    Marland Kitchen LEVALBUTEROL TARTRATE 45 MCG/ACT IN AERO Inhalation Inhale 1-2 puffs into the lungs every 4 (four) hours as needed. For shortness of breath    . LEVALBUTEROL HCL 1.25 MG/3ML IN NEBU Nebulization Take 1.25 mg by nebulization every 4 (four) hours as needed. For  shortness of breath    . METOPROLOL SUCCINATE ER 200 MG PO TB24 Oral Take 200 mg by mouth daily.    Marland Kitchen OMEPRAZOLE 20 MG PO CPDR Oral Take 20 mg by mouth 2 (two) times daily.    Marland Kitchen SPIRONOLACTONE 50 MG PO TABS Oral Take 50 mg by mouth daily.    Marland Kitchen TIOTROPIUM BROMIDE MONOHYDRATE 18 MCG IN CAPS Inhalation Place 18 mcg into inhaler and inhale daily.    Marland Kitchen VALSARTAN 160 MG PO TABS Oral Take 160 mg by mouth daily.      BP 120/63  Pulse 113  Temp(Src) 97.9 F (36.6 C) (Oral)  Resp 22  SpO2 100%  Physical Exam  Nursing note and vitals reviewed. Constitutional: She is oriented to person, place, and time. She appears well-developed.       Morbidly obese  HENT:  Head: Normocephalic and atraumatic.  Right Ear: External ear normal.  Left Ear: External ear normal.  Nose: Nose normal.  Mouth/Throat: Oropharynx is clear and moist.  Eyes: Conjunctivae and EOM are normal. Pupils are equal, round, and reactive to light.  Neck: Normal range of motion. Neck supple.  Cardiovascular: Tachycardia present.   Pulmonary/Chest:       bs decreased throughout  Abdominal: Soft. Bowel sounds are normal.  Musculoskeletal: Normal range of motion.  Neurological: She is alert and oriented to person, place, and time. She has normal reflexes.  Skin: Skin is warm and dry.  Psychiatric: She has a normal mood and affect.    ED Course  Procedures (including critical care time)  Labs Reviewed  CBC - Abnormal; Notable for the following:    MCV 77.2 (*)    RDW 16.3 (*)    All other components within normal limits  BASIC METABOLIC PANEL - Abnormal; Notable for the following:    Chloride 94 (*)    Glucose, Bld 112 (*)    BUN 32 (*)    Creatinine, Ser 2.04 (*)    GFR calc non Af Amer 26 (*)    GFR calc Af Amer 30 (*)    All other components within normal limits  DIFFERENTIAL  POCT I-STAT TROPONIN I  I-STAT TROPONIN I   Dg Chest 2 View  09/12/2011  *RADIOLOGY REPORT*  Clinical Data: Chest pain, shortness of  breath, and cough. Weakness.  CHEST - 2 VIEW  Comparison: None.  Findings: AICD in place.  Heart size and vascularity are normal. Slight linear atelectasis or scarring in both lung bases.  No effusions.  No acute osseous abnormality.  Slight peribronchial thickening which could be acute or chronic bronchitis.  IMPRESSION: Acute or chronic bronchitic changes. Minimal atelectasis or scarring at the bases.  Original Report Authenticated By: Gwynn Burly, M.D.    Date: 09/12/2011  Rate:114  Rhythm: evp  QRS Axis: indeterminate  Intervals: evp  ST/T Wave abnormalities: evp  Conduction Disutrbances:evp  Narrative Interpretation:   Old EKG Reviewed: none available    No diagnosis found.    MDM  Patient with uri symptoms and dyspnea.  Patient with history of copd with diminished bs throughout.  Her sats are 94% on oxygen.  Her creatinine is elevated here and patient denies history of renal insufficiency.  She appears to have some volume depletion and is receiving iv hydration.  Plan admission for hydration and treatment of copd exacerbation.    Patient is to be admitted to internal medicine as an unassigned patient.      Hilario Quarry, MD 09/12/11 [phone]

## 2011-09-12 NOTE — ED Notes (Signed)
Admit Doctor at bedside.  

## 2011-09-12 NOTE — ED Notes (Signed)
DC'd IV because of infiltration.

## 2011-09-12 NOTE — ED Notes (Signed)
Attempted IV twice by two RN's unable paged IV team.

## 2011-09-12 NOTE — ED Notes (Signed)
IV Team nurse unable to draw labs. Reordered CK, CKMB and I-Stat Tropinin. Call lab and requested ASAP.

## 2011-09-12 NOTE — ED Notes (Signed)
Pt is here with chest pain, back pain, cough, neck stiffness.  Pt reports sob.  Dizziness with standing.  Chills.    Pt has chf/copd

## 2011-09-12 NOTE — ED Notes (Signed)
Vital signs stable. 

## 2011-09-12 NOTE — ED Notes (Signed)
IV Team Nurse called and said she would be here in approx. 30 minutes.

## 2011-09-12 NOTE — ED Notes (Signed)
IV team at bedside 

## 2011-09-13 DIAGNOSIS — J449 Chronic obstructive pulmonary disease, unspecified: Secondary | ICD-10-CM

## 2011-09-13 LAB — COMPREHENSIVE METABOLIC PANEL
ALT: 15 U/L (ref 0–35)
Alkaline Phosphatase: 100 U/L (ref 39–117)
BUN: 33 mg/dL — ABNORMAL HIGH (ref 6–23)
CO2: 23 mEq/L (ref 19–32)
GFR calc Af Amer: 33 mL/min — ABNORMAL LOW (ref >90)
GFR calc non Af Amer: 29 mL/min — ABNORMAL LOW (ref >90)
Glucose, Bld: 148 mg/dL — ABNORMAL HIGH (ref 70–99)
Potassium: 4 mEq/L (ref 3.5–5.1)
Sodium: 133 mEq/L — ABNORMAL LOW (ref 135–145)
Total Bilirubin: 0.2 mg/dL — ABNORMAL LOW (ref 0.3–1.2)

## 2011-09-13 LAB — CARDIAC PANEL(CRET KIN+CKTOT+MB+TROPI)
Relative Index: 1.1 (ref 0.0–2.5)
Troponin I: <0.3 ng/mL (ref <0.30)

## 2011-09-13 MED ORDER — ENOXAPARIN SODIUM 40 MG/0.4ML ~~LOC~~ SOLN
40.0000 mg | SUBCUTANEOUS | Status: DC
  Administered 2011-09-13: 40 mg via SUBCUTANEOUS
  Filled 2011-09-13 (×2): qty 0.4

## 2011-09-13 MED ORDER — SODIUM CHLORIDE 0.9 % IV BOLUS (SEPSIS)
500.0000 mL | Freq: Once | INTRAVENOUS | Status: AC
  Administered 2011-09-13: 500 mL via INTRAVENOUS

## 2011-09-13 MED ORDER — SODIUM CHLORIDE 0.9 % IV SOLN
INTRAVENOUS | Status: AC
  Administered 2011-09-13 (×2): via INTRAVENOUS

## 2011-09-13 NOTE — Progress Notes (Signed)
Subjective: Patient is well this morning.  Pain is lessened, described as dulled down, less sharp.  Still having significant shortness of breath with walking to the bathroom and required rest between bed and restroom.  This is a new symptom as of a week or 2 ago, she used to have somewhat better exercise tolerance.  She also still has some lightheadedness with walking. Patient is not complaining of pain with deep respirations today.  Frontal headache still present but described as mild.  No visual changes.  Objective: Vital signs in last 24 hours: Filed Vitals:   09/12/11 2040 09/13/11 0102 09/13/11 0500 09/13/11 1015  BP: 132/78  98/64 101/71  Pulse: 110  99 97  Temp: 98.2 F (36.8 C)  97.4 F (36.3 C)   TempSrc: Oral  Oral   Resp: 24  22   Height:  5\' 3"  (1.6 m)    Weight:  125.646 kg (277 lb)    SpO2: 94%  97%    Weight change: 277lbs on admission.   Intake/Output Summary (Last 24 hours) at 09/13/11 1121 Last data filed at 09/13/11 0559  Gross per 24 hour  Intake 831.25 ml  Output      0 ml  Net 831.25 ml   Physical Exam: BP 101/71  Pulse 97  Temp(Src) 97.4 F (36.3 C) (Oral)  Resp 22  Ht 5\' 3"  (1.6 m)  Wt 125.646 kg (277 lb)  BMI 49.07 kg/m2  SpO2 97%  General Appearance:    Alert, cooperative, no distress, appears stated age  Head:    Normocephalic, without obvious abnormality, atraumatic  Lungs:     Clear to auscultation bilaterally, respirations unlabored  Chest Wall:    No tenderness or deformity   Heart:    Regular rate and rhythm, S1 and S2 normal, no murmur, rub   or gallop.  Difficult to ausculate 2/2 obesity, distant sounds.  Abdomen:     Soft, non-tender, bowel sounds active   Extremities:   Extremities normal, atraumatic, no cyanosis or edema  Pulses:   2+ and symmetric all extremities  Skin:   Skin color, texture, turgor normal, no rashes or lesions   Lab Results: Admission on 09/12/2011  Component Date Value Range Status  . WBC (K/uL) 09/12/2011 8.3   4.0-10.5 Final  . RBC (MIL/uL) 09/12/2011 5.08  3.87-5.11 Final  . Hemoglobin (g/dL) 16/05/9603 54.0  98.1-19.1 Final  . HCT (%) 09/12/2011 39.2  36.0-46.0 Final  . MCV (fL) 09/12/2011 77.2* 78.0-100.0 Final  . MCH (pg) 09/12/2011 26.2  26.0-34.0 Final  . MCHC (g/dL) 47/82/9562 13.0  86.5-78.4 Final  . RDW (%) 09/12/2011 16.3* 11.5-15.5 Final  . Platelets (K/uL) 09/12/2011 240  150-400 Final  . Neutrophils Relative (%) 09/12/2011 59  43-77 Final  . Neutro Abs (K/uL) 09/12/2011 4.9  1.7-7.7 Final  . Lymphocytes Relative (%) 09/12/2011 26  12-46 Final  . Lymphs Abs (K/uL) 09/12/2011 2.2  0.7-4.0 Final  . Monocytes Relative (%) 09/12/2011 10  3-12 Final  . Monocytes Absolute (K/uL) 09/12/2011 0.8  0.1-1.0 Final  . Eosinophils Relative (%) 09/12/2011 4  0-5 Final  . Eosinophils Absolute (K/uL) 09/12/2011 0.3  0.0-0.7 Final  . Basophils Relative (%) 09/12/2011 1  0-1 Final  . Basophils Absolute (K/uL) 09/12/2011 0.0  0.0-0.1 Final  . Sodium (mEq/L) 09/12/2011 136  135-145 Final  . Potassium (mEq/L) 09/12/2011 3.8  3.5-5.1 Final  . Chloride (mEq/L) 09/12/2011 94* 96-112 Final  . CO2 (mEq/L) 09/12/2011 26  19-32 Final  .  Glucose, Bld (mg/dL) 11/91/4782 956* 21-30 Final  . BUN (mg/dL) 86/57/8469 32* 6-29 Final  . Creatinine, Ser (mg/dL) 52/84/1324 4.01* 0.27-2.53 Final  . Calcium (mg/dL) 66/44/0347 42.5  9.5-63.8 Final  . GFR calc non Af Amer (mL/min) 09/12/2011 26* >90 Final  . GFR calc Af Amer (mL/min) 09/12/2011 30* >90 Final  . Troponin i, poc (ng/mL) 09/12/2011 0.01  0.00-0.08 Final  . Prothrombin Time (seconds) 09/12/2011 14.3  11.6-15.2 Final  . INR  09/12/2011 1.09  0.00-1.49 Final  . aPTT (seconds) 09/12/2011 29  24-37 Final  . D-Dimer, Quant (ug/mL-FEU) 09/12/2011 0.54* 0.00-0.48 Final  . Total CK (U/L) 09/12/2011 118  7-177 Final  . CK, MB (ng/mL) 09/12/2011 1.2  0.3-4.0 Final  . Relative Index  09/12/2011 1.0  0.0-2.5 Final  . Troponin i, poc (ng/mL) 09/12/2011 0.00   0.00-0.08 Final  . Sodium (mEq/L) 09/13/2011 133* 135-145 Final  . Potassium (mEq/L) 09/13/2011 4.0  3.5-5.1 Final  . Chloride (mEq/L) 09/13/2011 97  96-112 Final  . CO2 (mEq/L) 09/13/2011 23  19-32 Final  . Glucose, Bld (mg/dL) 75/64/3329 518* 84-16 Final  . BUN (mg/dL) 60/63/0160 33* 1-09 Final  . Creatinine, Ser (mg/dL) 32/35/5732 2.02* 5.42-7.06 Final  . Calcium (mg/dL) 23/76/2831 9.3  5.1-76.1 Final  . Total Protein (g/dL) 60/73/7106 7.8  2.6-9.4 Final  . Albumin (g/dL) 85/46/2703 3.3* 5.0-0.9 Final  . AST (U/L) 09/13/2011 17  0-37 Final  . ALT (U/L) 09/13/2011 15  0-35 Final  . Alkaline Phosphatase (U/L) 09/13/2011 100  39-117 Final  . Total Bilirubin (mg/dL) 38/18/2993 0.2* 7.1-6.9 Final  . GFR calc non Af Amer (mL/min) 09/13/2011 29* >90 Final  . GFR calc Af Amer (mL/min) 09/13/2011 33* >90 Final  . Total CK (U/L) 09/12/2011 122  7-177 Final  . CK, MB (ng/mL) 09/12/2011 1.4  0.3-4.0 Final  . Troponin I (ng/mL) 09/12/2011 <0.30  <0.30 Final  . Relative Index  09/12/2011 1.1  0.0-2.5 Final  . Total CK (U/L) 09/13/2011 132  7-177 Final  . CK, MB (ng/mL) 09/13/2011 1.5  0.3-4.0 Final  . Troponin I (ng/mL) 09/13/2011 <0.30  <0.30 Final  . Relative Index  09/13/2011 1.1  0.0-2.5 Final  Component Date Value Range  . WBC (K/uL) 09/12/2011 8.3  4.0-10.5  . RBC (MIL/uL) 09/12/2011 5.08  3.87-5.11  . Hemoglobin (g/dL) 67/89/3810 17.5  10.2-58.5  . HCT (%) 09/12/2011 39.2  36.0-46.0  . MCV (fL) 09/12/2011 77.2* 78.0-100.0  . MCH (pg) 09/12/2011 26.2  26.0-34.0  . MCHC (g/dL) 27/78/2423 53.6  14.4-31.5  . RDW (%) 09/12/2011 16.3* 11.5-15.5  . Platelets (K/uL) 09/12/2011 240  150-400  . Neutrophils Relative (%) 09/12/2011 59  43-77  . Neutro Abs (K/uL) 09/12/2011 4.9  1.7-7.7  . Lymphocytes Relative (%) 09/12/2011 26  12-46  . Lymphs Abs (K/uL) 09/12/2011 2.2  0.7-4.0  . Monocytes Relative (%) 09/12/2011 10  3-12  . Monocytes Absolute (K/uL) 09/12/2011 0.8  0.1-1.0  . Eosinophils  Relative (%) 09/12/2011 4  0-5  . Eosinophils Absolute (K/uL) 09/12/2011 0.3  0.0-0.7  . Basophils Relative (%) 09/12/2011 1  0-1  . Basophils Absolute (K/uL) 09/12/2011 0.0  0.0-0.1  . Sodium (mEq/L) 09/12/2011 136  135-145  . Potassium (mEq/L) 09/12/2011 3.8  3.5-5.1  . Chloride (mEq/L) 09/12/2011 94* 96-112  . CO2 (mEq/L) 09/12/2011 26  19-32  . Glucose, Bld (mg/dL) 40/03/6760 950* 93-26  . BUN (mg/dL) 71/24/5809 32* 9-83  . Creatinine, Ser (mg/dL) 38/25/0539 7.67* 3.41-9.37  . Calcium (mg/dL) 90/24/0973 53.2  8.4-10.5  . GFR calc non Af Amer (mL/min) 09/12/2011 26* >90  . GFR calc Af Amer (mL/min) 09/12/2011 30* >90  . Troponin i, poc (ng/mL) 09/12/2011 0.01  0.00-0.08  . Comment 3  09/12/2011           . Prothrombin Time (seconds) 09/12/2011 14.3  11.6-15.2  . INR  09/12/2011 1.09  0.00-1.49  . aPTT (seconds) 09/12/2011 29  24-37  . D-Dimer, Quant (ug/mL-FEU) 09/12/2011 0.54* 0.00-0.48  . Total CK (U/L) 09/12/2011 118  7-177  . CK, MB (ng/mL) 09/12/2011 1.2  0.3-4.0  . Relative Index  09/12/2011 1.0  0.0-2.5  . Troponin i, poc (ng/mL) 09/12/2011 0.00  0.00-0.08  . Comment 3  09/12/2011           . Sodium (mEq/L) 09/13/2011 133* 135-145  . Potassium (mEq/L) 09/13/2011 4.0  3.5-5.1  . Chloride (mEq/L) 09/13/2011 97  96-112  . CO2 (mEq/L) 09/13/2011 23  19-32  . Glucose, Bld (mg/dL) 40/98/1191 478* 29-56  . BUN (mg/dL) 21/30/8657 33* 8-46  . Creatinine, Ser (mg/dL) 96/29/5284 1.32* 4.40-1.02  . Calcium (mg/dL) 72/53/6644 9.3  0.3-47.4  . Total Protein (g/dL) 25/95/6387 7.8  5.6-4.3  . Albumin (g/dL) 32/95/1884 3.3* 1.6-6.0  . AST (U/L) 09/13/2011 17  0-37  . ALT (U/L) 09/13/2011 15  0-35  . Alkaline Phosphatase (U/L) 09/13/2011 100  39-117  . Total Bilirubin (mg/dL) 63/08/6008 0.2* 9.3-2.3  . GFR calc non Af Amer (mL/min) 09/13/2011 29* >90  . GFR calc Af Amer (mL/min) 09/13/2011 33* >90  . Total CK (U/L) 09/12/2011 122  7-177  . CK, MB (ng/mL) 09/12/2011 1.4  0.3-4.0  .  Troponin I (ng/mL) 09/12/2011 <0.30  <0.30  . Relative Index  09/12/2011 1.1  0.0-2.5  . Total CK (U/L) 09/13/2011 132  7-177  . CK, MB (ng/mL) 09/13/2011 1.5  0.3-4.0  . Troponin I (ng/mL) 09/13/2011 <0.30  <0.30  . Relative Index  09/13/2011 1.1  0.0-2.5   Studies/Results: Dg Chest 2 View  09/12/2011  *RADIOLOGY REPORT*  Clinical Data: Chest pain, shortness of breath, and cough. Weakness.  CHEST - 2 VIEW  Comparison: None.  Findings: AICD in place.  Heart size and vascularity are normal. Slight linear atelectasis or scarring in both lung bases.  No effusions.  No acute osseous abnormality.  Slight peribronchial thickening which could be acute or chronic bronchitis.  IMPRESSION: Acute or chronic bronchitic changes. Minimal atelectasis or scarring at the bases.  Original Report Authenticated By: Gwynn Burly, M.D.   Medications: I have reviewed the patient's current medications. Scheduled Meds:   . albuterol  5 mg Nebulization Once  . allopurinol  100 mg Oral Daily  . aspirin  324 mg Oral Once  . aspirin EC  81 mg Oral Daily  . enoxaparin  30 mg Subcutaneous Q24H  . ipratropium  0.5 mg Nebulization Once  . methylPREDNISolone (SOLU-MEDROL) injection  125 mg Intravenous Once  . metoprolol  200 mg Oral Daily  . oxyCODONE-acetaminophen  1 tablet Oral Once  . pantoprazole  40 mg Oral BID AC  . sodium chloride  500 mL Intravenous Once  . sodium chloride  500 mL Intravenous Once  . DISCONTD: sodium chloride  500 mL Intravenous Once   Continuous Infusions:   . sodium chloride 125 mL/hr at 09/12/11 2320 x 6hrs  . sodium chloride 100 mL/hr at 09/13/11 1120   PRN Meds:.acetaminophen, acetaminophen, levalbuterol, ondansetron (ZOFRAN) IV, ondansetron  A&P:  1) Atypical chest pain and back  pain: Likely from musculoskeletal origin. ACS is on differential due to history of CHF- but seems unlikely from history and negative cardiac enzymes. Dehydration likely an exacerbating factor.  WBC and  Electrolytes wnl, but anion gap of 16 on admission.   AM labs showed resolving anion gap of 13.  Plan: Admit to telemetry bed. - Cardiac enzymes normal x 2. - D-dimer weakly positive at 0.54, however, clinical suspicion of PE still low given history and clinical picture. - Contacted outpatient cardiologist, awaiting call back from CHF nurse for info and will request formal records as well to establish baseline heart, lung, and kidney function. Baseline Cr was 1.3 recently. -12-lead EKG in a.m. No events on telemetry.   2) Acute kidney injury: Creatinine 2.04, AG=16 on admission. AM labs Cr 1.86, AG=13.  Received Bolus NS in ED and NS IV 179ml/hr x 6hrs overnight.  Baseline Cr 1.3 during past month per phone conversation with CHF doctor.  Suspect prerenal etiology 2/2 dehydration, with contribution of meds- on lasix, ARB and spironolactone  Plan: Controlled hydration due to history of CHF. - EF is low per discussion with cardiologist, requiring ICD placement - FeNa would be unreliable as patient on Lasix. - IVF NS IV @ 100 ml/hr. Monitor clinical signs/symptoms for fluid overload, edema, SOB at rest, orthopnea, crackles at lung bases. - Check AM BMP   3) Congestive heart failure:  Dilated CM per phone conversation with outpatient cardiology.  Will review records to confirm and elaborate history. Chest x-ray does not show pulmonary edema suggesting volume overload and patient does not have clinical signs of edema to suggest volume overload- no indication of worsening CHF.  Plan: Get records. - Hold Lasix, spironolactone, ARB. - Continue metoprolol.  4) DVT prophylaxis: Lovenox.  5) Dispo: To home with daughter likely tomorrow if creatinine continues to improve.  Will follow up with outpatient PCP and cardiology.    LOS: 1 day   Dwaine Gale 09/13/2011, 11:21 AM  Resident Addendum to Medical Student Note   I have seen and examined the patient, and agree with the the  medical student assessment and plan outlined above. Please see my brief note below for additional details.  HPI: Patient has decreased exercise tolerance and some improved chest soreness.  OBJECTIVE: VS: Reviewed  Meds: Reviewed  Labs: Reviewed  Imaging: Reviewed   Physical Exam: General: Vital signs reviewed and noted. Well-developed, well-nourished, in no acute distress; alert, appropriate and cooperative throughout examination.  Lungs:  Normal respiratory effort. Clear to auscultation BL without crackles or wheezes.  Heart: Difficult to auscultate due to body habitus.  Abdomen:  BS normoactive. Soft, Nondistended, non-tender.  No masses or organomegaly.  Extremities: No pretibial edema.     ASSESSMENT/ PLAN: Will likely discharge tomorrow if creatinine improves further to 1.5. Will speak to CHF nurse to try and find cause of dehydration/AKI. Length of Stay: 1   Margorie John, M.D. [facility], Internal Medicine Resident 09/13/2011, 3:08 PM

## 2011-09-13 NOTE — Progress Notes (Signed)
Pt has a b/p 98/64. Pt states that she has been having a low blood pressure over the past couple of months. The doctor was notified. Will continue to monitor. Gwynne Edinger, RN

## 2011-09-14 DIAGNOSIS — I10 Essential (primary) hypertension: Secondary | ICD-10-CM | POA: Insufficient documentation

## 2011-09-14 DIAGNOSIS — M549 Dorsalgia, unspecified: Secondary | ICD-10-CM

## 2011-09-14 DIAGNOSIS — N179 Acute kidney failure, unspecified: Secondary | ICD-10-CM

## 2011-09-14 DIAGNOSIS — J449 Chronic obstructive pulmonary disease, unspecified: Secondary | ICD-10-CM

## 2011-09-14 DIAGNOSIS — R0789 Other chest pain: Secondary | ICD-10-CM

## 2011-09-14 DIAGNOSIS — Z95810 Presence of automatic (implantable) cardiac defibrillator: Secondary | ICD-10-CM | POA: Insufficient documentation

## 2011-09-14 DIAGNOSIS — I420 Dilated cardiomyopathy: Secondary | ICD-10-CM

## 2011-09-14 DIAGNOSIS — I509 Heart failure, unspecified: Secondary | ICD-10-CM

## 2011-09-14 LAB — BASIC METABOLIC PANEL
Chloride: 104 mEq/L (ref 96–112)
Creatinine, Ser: 1.8 mg/dL — ABNORMAL HIGH (ref 0.50–1.10)
GFR calc Af Amer: 34 mL/min — ABNORMAL LOW (ref >90)
Potassium: 3.8 mEq/L (ref 3.5–5.1)
Sodium: 137 mEq/L (ref 135–145)

## 2011-09-14 LAB — URINALYSIS, ROUTINE W REFLEX MICROSCOPIC
Ketones, ur: NEGATIVE mg/dL
Nitrite: NEGATIVE
Specific Gravity, Urine: 1.012 (ref 1.005–1.030)
pH: 6 (ref 5.0–8.0)

## 2011-09-14 LAB — URINE MICROSCOPIC-ADD ON

## 2011-09-14 MED ORDER — METOPROLOL SUCCINATE ER 100 MG PO TB24
200.0000 mg | ORAL_TABLET | Freq: Every day | ORAL | Status: DC
  Administered 2011-09-14: 200 mg via ORAL
  Filled 2011-09-14: qty 2

## 2011-09-14 NOTE — Discharge Summary (Signed)
Internal Medicine Teaching [facility] Discharge Note  Name: Monique Kane MRN: [id number] DOB: 03-Feb-1951 61 y.o.  Date of Admission: 09/12/2011 10:37 AM Date of Discharge: 09/15/2011 Attending Physician: Dr. Lina Sayre  Discharge Diagnosis: muscle strain/spasm of upper paraspinal muscles, AKI Secondary Diagnoses: CHF with ventricular pacemaker Dilated cardiomyopthy COPD Hypertension Gout Chronic low back pain   Discharge Medications: Discharge Medication List as of 09/14/2011  1:29 PM    CONTINUE these medications which have NOT CHANGED   Details  allopurinol (ZYLOPRIM) 100 MG tablet Take 100 mg by mouth daily., Until Discontinued, Historical Med    aspirin EC 81 MG tablet Take 81 mg by mouth daily., Until Discontinued, Historical Med    levalbuterol (XOPENEX HFA) 45 MCG/ACT inhaler Inhale 1-2 puffs into the lungs every 4 (four) hours as needed. For shortness of breath, Until Discontinued, Historical Med    levalbuterol (XOPENEX) 1.25 MG/3ML nebulizer solution Take 1.25 mg by nebulization every 4 (four) hours as needed. For shortness of breath, Until Discontinued, Historical Med    omeprazole (PRILOSEC) 20 MG capsule Take 20 mg by mouth 2 (two) times daily., Until Discontinued, Historical Med    tiotropium (SPIRIVA) 18 MCG inhalation capsule Place 18 mcg into inhaler and inhale daily., Until Discontinued, Historical Med      STOP taking these medications     furosemide (LASIX) 20 MG tablet      metoprolol (TOPROL-XL) 200 MG 24 hr tablet      spironolactone (ALDACTONE) 50 MG tablet      valsartan (DIOVAN) 160 MG tablet         Disposition and follow-up:   [name] was discharged from [facility] in Stable condition.    Follow-up Appointments: Follow-up Information    Follow up with Heart Failure clinic/ doctor. Schedule an appointment as soon as possible for a visit in 3 days. (Please call Rosina Lowenstein and make appt for  early next week)         Discharge Orders    Future Orders Please Complete By Expires   Diet - low sodium heart healthy      Increase activity slowly      (HEART FAILURE PATIENTS) Call MD:  Anytime you have any of the following symptoms: 1) 3 pound weight gain in 24 hours or 5 pounds in 1 week 2) shortness of breath, with or without a dry hacking cough 3) swelling in the hands, feet or stomach 4) if you have to sleep on extra pillows at night in order to breathe.      Discharge instructions      Comments:   Take 1-2 tylenol 325mg  pills every 4- 6 hours as needed for pain, do not exceed 12 pills per day      Procedures Performed:  Dg Chest 2 View  09/12/2011  *RADIOLOGY REPORT*  Clinical Data: Chest pain, shortness of breath, and cough. Weakness.  CHEST - 2 VIEW  Comparison: None.  Findings: AICD in place.  Heart size and vascularity are normal. Slight linear atelectasis or scarring in both lung bases.  No effusions.  No acute osseous abnormality.  Slight peribronchial thickening which could be acute or chronic bronchitis.  IMPRESSION: Acute or chronic bronchitic changes. Minimal atelectasis or scarring at the bases.  Original Report Authenticated By: Gwynn Burly, M.D.   Admission HPI: Patient is a 61 yo woman with a history of COPD and CHF with pacemaker who present today complaining of worsening back and neck pain for  1 week. Patient says she has been feeling poorly for about 2 weeks at first having congestion, some cough, and decreased appetite similar to the grandkids who live at home with her. However, 7 days ago she woke up and got out of bed quickly with a sharp pain in her middle upper back she describes as sharp and 10/10 on a pain scale. She is part of CHF care program in which she takes her blood pressure and heart rate every morning and she reports these were 96/50's and her heart rate was high. She says this pain is worse with movement or deep breaths and best when laying flat and  still. Also improves with aspirin and tylenol arthritis. Patient says the pain is similar when she had pneumonia about a year ago. Patient says her shortness of breath has not worsened from baseline and that her cough and congestion have improved. Patient says she is only short of breath with walking and uses her nebulizer 2x/day and rescue inhaler 3-4x/day per cardiology recommendations. Unclear what her baseline dyspnea and previous inhaler use was though current dosing appears to be a recent increase. Her back pain has worsened and spread to include her neck and wraps around to her sides as well and limits her movement of her head. The pain has also begun making it difficult to get around and she had to begin using a walker and having to take breaks because of the pain, which is what finally prompted her to seek medical care. She also reports her weight is down to 277 from 290 2 weeks ago. Of lesser concern to the patient is the headache she had been having in addition to the neck pain. Patient expressed concern about meningitis because of difficulty turning head because her daughter died of meningitis. Patient denies vision changes, GI symptoms, however, she did report some lightheadedness with standing too quickly. In addition to the low BP reading, she also reports decreased PO intake, especially of fluids (8oz of water and 2-3 glasses of juice per day, cardiology limits intake to 64 oz/day).    Hospital Course by problem list:  Acute muscle strain: Patient presented to the ED with the primary complaint of worsening upper back and neck pain over 1 week.  There was low suspicion for cardiac or pulmonary origins of pain given the history, however, CXR, EKG, cardiac enzymes, and D-dimer were done and were negative for infiltrates, PE, or cardiac ischemia.  The patient remained afebrile for the duration of the admission and her heart rate returned to normal with IVF hydration.   The patient was given Tylenol  prn which controlled her pain fairly well.  Upon discharge the patient's neck pain/stiffness was resolved and she had full range of motion without pain.  Her back pain was still present, exacerbated by movement and palpation, but dulled and much improved from admission. The patients blood pressure remained in the 90s/60s, therefore, her spironolactone, valsartan, and lasix were held and she was discharged with instructions to hold the medications at home until follow up with her PCP.  Dehydration and AKI:  On admission Cr was 2.04 and anion gap of 16 with negative orthostatics.  Patient was rehydrated with IVF and clinically improved with decreased fatigue, SOB, and lightheadedness with walking. Cr improved only slightly to 1.80 on discharge.  Per outpatient provider, baseline is about 1.3 suggesting some intrinsic kidney disease of unknown etiology, however, nurse denied patient medications changed recently to explain an acute change.  Patient  will hold diuretics and follow up with PCP for spot urine protein, CMP, and FeNa since she will have been off diuretics for several days.  She is encouraged to hydrate with 48-64 oz of fluids daily and adhere to her low salt diet. Recommend avoid NSAIDs and other nephrotoxic drugs in the future.  Health Maintenance:  The patients home medications were continued with the exception of her blood pressure medications as noted above.  No new medications were added during the hospitalization. Recommend patient monitor fluid intake to prevent dehydration and fluid overload and follow up early next week with PCP and CHF nurse.  Discharge Vitals:  BP 101/70  Pulse 73  Temp(Src) 97.8 F (36.6 C) (Oral)  Resp 18  Ht 5\' 3"  (1.6 m)  Wt 125.646 kg (277 lb)  BMI 49.07 kg/m2  SpO2 94%  Discharge Labs:  No results found for this or any previous visit (from the past 24 hour(s)).  Signed: Dwaine Gale 09/15/2011, 11:35 AM

## 2011-09-14 NOTE — Progress Notes (Signed)
Pt given AVS discharge instructions. Discussed s/s of HF and gave pt follow up appointments and medication administration times. All queestions anwered. D/c IV, unremarkable. Pt left accompanied by volunteer in wheelchair with family member attending for home.  Peter Congo RN

## 2011-09-14 NOTE — Progress Notes (Signed)
Discharge Note:   S: patient is doing well, was able to get up and take a shower without any lightheadedness  O:  Filed Vitals:   09/14/11 1252  BP: 101/70  Pulse: 73  Temp:   Resp: 18   Orthostatic Vitals:  Laying- BP 99/57  P 73 Sitting- BP 109/68      P 72 Standing- BP 107/70  P 74  A/P: Patient is feeling well and want to return home with close follow up with [facility] CHF program. Will not restart her spironolactone, lasix, ARB or metoprolol at this time since she is not hypertensive or tachycardic.

## 2011-09-21 NOTE — Discharge Summary (Signed)
Monique Kane 

## 2011-12-04 ENCOUNTER — Inpatient Hospital Stay (HOSPITAL_COMMUNITY)
Admission: AD | Admit: 2011-12-04 | Discharge: 2011-12-08 | DRG: 191 | Disposition: A | Discharge status: Home / Self Care | Payer: Medicaid Other | Attending: Internal Medicine | Admitting: Internal Medicine

## 2011-12-04 ENCOUNTER — Emergency Department (HOSPITAL_COMMUNITY): Payer: Medicaid Other

## 2011-12-04 ENCOUNTER — Encounter (HOSPITAL_COMMUNITY): Payer: Self-pay | Admitting: *Deleted

## 2011-12-04 DIAGNOSIS — I10 Essential (primary) hypertension: Secondary | ICD-10-CM | POA: Diagnosis present

## 2011-12-04 DIAGNOSIS — Z9981 Dependence on supplemental oxygen: Secondary | ICD-10-CM

## 2011-12-04 DIAGNOSIS — Z6841 Body Mass Index (BMI) 40.0 and over, adult: Secondary | ICD-10-CM

## 2011-12-04 DIAGNOSIS — Z95810 Presence of automatic (implantable) cardiac defibrillator: Secondary | ICD-10-CM

## 2011-12-04 DIAGNOSIS — I420 Dilated cardiomyopathy: Secondary | ICD-10-CM | POA: Diagnosis present

## 2011-12-04 DIAGNOSIS — M109 Gout, unspecified: Secondary | ICD-10-CM | POA: Diagnosis present

## 2011-12-04 DIAGNOSIS — Z7982 Long term (current) use of aspirin: Secondary | ICD-10-CM

## 2011-12-04 DIAGNOSIS — I509 Heart failure, unspecified: Secondary | ICD-10-CM | POA: Diagnosis present

## 2011-12-04 DIAGNOSIS — R0603 Acute respiratory distress: Secondary | ICD-10-CM | POA: Diagnosis present

## 2011-12-04 DIAGNOSIS — J441 Chronic obstructive pulmonary disease with (acute) exacerbation: Principal | ICD-10-CM | POA: Diagnosis present

## 2011-12-04 DIAGNOSIS — I428 Other cardiomyopathies: Secondary | ICD-10-CM | POA: Diagnosis present

## 2011-12-04 DIAGNOSIS — Z96659 Presence of unspecified artificial knee joint: Secondary | ICD-10-CM

## 2011-12-04 DIAGNOSIS — Z87891 Personal history of nicotine dependence: Secondary | ICD-10-CM

## 2011-12-04 DIAGNOSIS — I5032 Chronic diastolic (congestive) heart failure: Secondary | ICD-10-CM | POA: Diagnosis present

## 2011-12-04 DIAGNOSIS — Z79899 Other long term (current) drug therapy: Secondary | ICD-10-CM

## 2011-12-04 HISTORY — DX: Unspecified osteoarthritis, unspecified site: M19.90

## 2011-12-04 LAB — DIFFERENTIAL
Eosinophils Absolute: 0.5 10*3/uL (ref 0.0–0.7)
Eosinophils Relative: 5 % (ref 0–5)
Lymphocytes Relative: 28 % (ref 12–46)
Lymphs Abs: 3 10*3/uL (ref 0.7–4.0)
Monocytes Absolute: 0.6 10*3/uL (ref 0.1–1.0)

## 2011-12-04 LAB — POCT I-STAT TROPONIN I: Troponin i, poc: 0.01 ng/mL (ref 0.00–0.08)

## 2011-12-04 LAB — CBC
HCT: 36.3 % (ref 36.0–46.0)
MCH: 26.4 pg (ref 26.0–34.0)
MCV: 81.2 fL (ref 78.0–100.0)
RBC: 4.47 MIL/uL (ref 3.87–5.11)
RDW: 15.3 % (ref 11.5–15.5)
WBC: 10.7 10*3/uL — ABNORMAL HIGH (ref 4.0–10.5)

## 2011-12-04 MED ORDER — METHYLPREDNISOLONE SODIUM SUCC 125 MG IJ SOLR
125.0000 mg | Freq: Once | INTRAMUSCULAR | Status: AC
  Administered 2011-12-05: 125 mg via INTRAVENOUS
  Filled 2011-12-04: qty 2

## 2011-12-04 MED ORDER — ONDANSETRON HCL 4 MG/2ML IJ SOLN
4.0000 mg | Freq: Once | INTRAMUSCULAR | Status: AC
  Administered 2011-12-05: 4 mg via INTRAVENOUS
  Filled 2011-12-04: qty 2

## 2011-12-04 MED ORDER — ALBUTEROL SULFATE (5 MG/ML) 0.5% IN NEBU
5.0000 mg | INHALATION_SOLUTION | Freq: Once | RESPIRATORY_TRACT | Status: AC
  Administered 2011-12-05: 5 mg via RESPIRATORY_TRACT
  Filled 2011-12-04: qty 0.5

## 2011-12-04 MED ORDER — MORPHINE SULFATE 4 MG/ML IJ SOLN
4.0000 mg | Freq: Once | INTRAMUSCULAR | Status: AC
  Administered 2011-12-05: 4 mg via INTRAVENOUS
  Filled 2011-12-04: qty 1

## 2011-12-04 NOTE — ED Notes (Signed)
The pt has had a cough since Friday and not feeling well.  Productive cough with thick green and blood tiinged.  Hx oc copd

## 2011-12-05 ENCOUNTER — Encounter (HOSPITAL_COMMUNITY): Payer: Self-pay | Admitting: Internal Medicine

## 2011-12-05 ENCOUNTER — Emergency Department (HOSPITAL_COMMUNITY): Payer: Medicaid Other

## 2011-12-05 DIAGNOSIS — R0603 Acute respiratory distress: Secondary | ICD-10-CM | POA: Diagnosis present

## 2011-12-05 DIAGNOSIS — J441 Chronic obstructive pulmonary disease with (acute) exacerbation: Secondary | ICD-10-CM | POA: Diagnosis present

## 2011-12-05 LAB — PRO B NATRIURETIC PEPTIDE
Pro B Natriuretic peptide (BNP): 38.5 pg/mL (ref 0–125)
Pro B Natriuretic peptide (BNP): 66.6 pg/mL (ref 0–125)

## 2011-12-05 LAB — CARDIAC PANEL(CRET KIN+CKTOT+MB+TROPI)
CK, MB: 1.2 ng/mL (ref 0.3–4.0)
Relative Index: 1.3 (ref 0.0–2.5)
Relative Index: 1.8 (ref 0.0–2.5)
Total CK: 93 U/L (ref 7–177)

## 2011-12-05 LAB — CBC
HCT: 35.9 % — ABNORMAL LOW (ref 36.0–46.0)
Hemoglobin: 11.8 g/dL — ABNORMAL LOW (ref 12.0–15.0)
RBC: 4.48 MIL/uL (ref 3.87–5.11)
WBC: 12.2 10*3/uL — ABNORMAL HIGH (ref 4.0–10.5)

## 2011-12-05 LAB — COMPREHENSIVE METABOLIC PANEL
ALT: 15 U/L (ref 0–35)
AST: 16 U/L (ref 0–37)
Albumin: 3.3 g/dL — ABNORMAL LOW (ref 3.5–5.2)
Alkaline Phosphatase: 121 U/L — ABNORMAL HIGH (ref 39–117)
Chloride: 103 mEq/L (ref 96–112)
Potassium: 3.9 mEq/L (ref 3.5–5.1)
Total Bilirubin: 0.2 mg/dL — ABNORMAL LOW (ref 0.3–1.2)

## 2011-12-05 LAB — CREATININE, SERUM
Creatinine, Ser: 1.15 mg/dL — ABNORMAL HIGH (ref 0.50–1.10)
GFR calc Af Amer: 59 mL/min — ABNORMAL LOW (ref >90)
GFR calc non Af Amer: 51 mL/min — ABNORMAL LOW (ref >90)

## 2011-12-05 MED ORDER — IRBESARTAN 75 MG PO TABS
75.0000 mg | ORAL_TABLET | Freq: Every day | ORAL | Status: DC
  Administered 2011-12-05 – 2011-12-06 (×2): 75 mg via ORAL
  Filled 2011-12-05 (×2): qty 1

## 2011-12-05 MED ORDER — ONDANSETRON HCL 4 MG/2ML IJ SOLN
4.0000 mg | Freq: Once | INTRAMUSCULAR | Status: AC
  Administered 2011-12-05: 4 mg via INTRAVENOUS
  Filled 2011-12-05: qty 2

## 2011-12-05 MED ORDER — PANTOPRAZOLE SODIUM 40 MG PO TBEC
40.0000 mg | DELAYED_RELEASE_TABLET | Freq: Every day | ORAL | Status: DC
  Administered 2011-12-05 – 2011-12-08 (×4): 40 mg via ORAL
  Filled 2011-12-05 (×4): qty 1

## 2011-12-05 MED ORDER — ASPIRIN EC 81 MG PO TBEC
81.0000 mg | DELAYED_RELEASE_TABLET | Freq: Every day | ORAL | Status: DC
  Administered 2011-12-05 – 2011-12-08 (×4): 81 mg via ORAL
  Filled 2011-12-05 (×4): qty 1

## 2011-12-05 MED ORDER — MOXIFLOXACIN HCL IN NACL 400 MG/250ML IV SOLN
400.0000 mg | INTRAVENOUS | Status: AC
  Administered 2011-12-05 – 2011-12-07 (×3): 400 mg via INTRAVENOUS
  Filled 2011-12-05 (×3): qty 250

## 2011-12-05 MED ORDER — ALBUTEROL SULFATE (5 MG/ML) 0.5% IN NEBU
5.0000 mg | INHALATION_SOLUTION | Freq: Once | RESPIRATORY_TRACT | Status: AC
  Administered 2011-12-05: 5 mg via RESPIRATORY_TRACT
  Filled 2011-12-05: qty 1

## 2011-12-05 MED ORDER — MORPHINE SULFATE 2 MG/ML IJ SOLN
1.0000 mg | INTRAMUSCULAR | Status: DC | PRN
  Administered 2011-12-05: 0.5 mg via INTRAVENOUS
  Administered 2011-12-06 – 2011-12-08 (×5): 1 mg via INTRAVENOUS
  Filled 2011-12-05 (×7): qty 1

## 2011-12-05 MED ORDER — SODIUM CHLORIDE 0.9 % IV SOLN: 250.0000 mL | INTRAVENOUS | Status: DC | PRN

## 2011-12-05 MED ORDER — SODIUM CHLORIDE 0.9 % IJ SOLN
3.0000 mL | Freq: Two times a day (BID) | INTRAMUSCULAR | Status: DC
  Administered 2011-12-05 – 2011-12-08 (×6): 3 mL via INTRAVENOUS

## 2011-12-05 MED ORDER — LEVALBUTEROL HCL 1.25 MG/0.5ML IN NEBU
1.2500 mg | INHALATION_SOLUTION | RESPIRATORY_TRACT | Status: DC | PRN
  Administered 2011-12-05 – 2011-12-06 (×2): 1.25 mg via RESPIRATORY_TRACT
  Filled 2011-12-05 (×5): qty 0.5

## 2011-12-05 MED ORDER — ONDANSETRON HCL 4 MG PO TABS: 4.0000 mg | ORAL_TABLET | Freq: Four times a day (QID) | ORAL | Status: DC | PRN

## 2011-12-05 MED ORDER — LEVALBUTEROL HCL 1.25 MG/3ML IN NEBU: 1.2500 mg | INHALATION_SOLUTION | RESPIRATORY_TRACT | Status: DC | PRN

## 2011-12-05 MED ORDER — SPIRONOLACTONE 50 MG PO TABS
50.0000 mg | ORAL_TABLET | Freq: Every day | ORAL | Status: DC
  Administered 2011-12-05 – 2011-12-07 (×3): 50 mg via ORAL
  Filled 2011-12-05 (×3): qty 1

## 2011-12-05 MED ORDER — METOPROLOL SUCCINATE ER 100 MG PO TB24
200.0000 mg | ORAL_TABLET | Freq: Every day | ORAL | Status: DC
  Administered 2011-12-05 – 2011-12-08 (×4): 200 mg via ORAL
  Filled 2011-12-05 (×4): qty 2

## 2011-12-05 MED ORDER — IPRATROPIUM BROMIDE 0.02 % IN SOLN
0.5000 mg | Freq: Four times a day (QID) | RESPIRATORY_TRACT | Status: DC
  Administered 2011-12-05 – 2011-12-06 (×6): 0.5 mg via RESPIRATORY_TRACT
  Filled 2011-12-05 (×6): qty 2.5

## 2011-12-05 MED ORDER — ONDANSETRON HCL 4 MG/2ML IJ SOLN: 4.0000 mg | Freq: Four times a day (QID) | INTRAMUSCULAR | Status: DC | PRN

## 2011-12-05 MED ORDER — IOHEXOL 300 MG/ML  SOLN
100.0000 mL | Freq: Once | INTRAMUSCULAR | Status: AC | PRN
  Administered 2011-12-05: 100 mL via INTRAVENOUS

## 2011-12-05 MED ORDER — ENOXAPARIN SODIUM 40 MG/0.4ML ~~LOC~~ SOLN
40.0000 mg | SUBCUTANEOUS | Status: DC
  Administered 2011-12-05 – 2011-12-07 (×3): 40 mg via SUBCUTANEOUS
  Filled 2011-12-05 (×4): qty 0.4

## 2011-12-05 MED ORDER — SODIUM CHLORIDE 0.9 % IJ SOLN: 3.0000 mL | INTRAMUSCULAR | Status: DC | PRN

## 2011-12-05 MED ORDER — GUAIFENESIN ER 600 MG PO TB12
600.0000 mg | ORAL_TABLET | Freq: Two times a day (BID) | ORAL | Status: DC
  Administered 2011-12-05 – 2011-12-08 (×7): 600 mg via ORAL
  Filled 2011-12-05 (×8): qty 1

## 2011-12-05 MED ORDER — ALLOPURINOL 100 MG PO TABS
100.0000 mg | ORAL_TABLET | Freq: Every day | ORAL | Status: DC
  Administered 2011-12-05 – 2011-12-07 (×3): 100 mg via ORAL
  Filled 2011-12-05 (×3): qty 1

## 2011-12-05 MED ORDER — MORPHINE SULFATE 4 MG/ML IJ SOLN
4.0000 mg | Freq: Once | INTRAMUSCULAR | Status: AC
  Administered 2011-12-05: 4 mg via INTRAVENOUS
  Filled 2011-12-05: qty 1

## 2011-12-05 MED ORDER — FUROSEMIDE 20 MG PO TABS
20.0000 mg | ORAL_TABLET | Freq: Every day | ORAL | Status: DC
  Administered 2011-12-05 – 2011-12-06 (×2): 20 mg via ORAL
  Filled 2011-12-05 (×2): qty 1

## 2011-12-05 MED ORDER — METOPROLOL SUCCINATE ER 100 MG PO TB24: 200.0000 mg | ORAL_TABLET | ORAL | Status: DC

## 2011-12-05 MED ORDER — PREDNISONE 50 MG PO TABS
60.0000 mg | ORAL_TABLET | Freq: Two times a day (BID) | ORAL | Status: DC
  Administered 2011-12-05 – 2011-12-06 (×3): 60 mg via ORAL
  Filled 2011-12-05 (×4): qty 1

## 2011-12-05 NOTE — Progress Notes (Signed)
I have seen and examined pt with h/o COPD home O2 dependent, CHF, dilated CM admitted SOB, will continue current management and plan as per Dr Mikeal Hawthorne, follow up on BNP, CEs and further treat accordingly.  Donnalee Curry [facility] [phone]

## 2011-12-05 NOTE — ED Notes (Signed)
Continues V paced on monitor at 98.  Offers no complaints.  Awaiting disposition

## 2011-12-05 NOTE — ED Notes (Signed)
Awaiting CT results.  Offers no complaints or needs at this time.

## 2011-12-05 NOTE — ED Notes (Signed)
IV 3eam paged for #20 gauge IV for CT angio

## 2011-12-05 NOTE — Progress Notes (Signed)
Utilization Review Completed.Pearlena Ow T4/04/2012   

## 2011-12-05 NOTE — ED Notes (Signed)
Ambulated to BR on return from CT.

## 2011-12-05 NOTE — H&P (Signed)
Monique Kane is an 61 y.o. female.   Chief Complaint: Shortness of breath HPI: A 61 year old morbidly obese female with known history of CHF due to diastolic dysfunction and dilated cardiomyopathy as well as COPD here with complaint of worsening shortness of breath or Monique Kane is lying. She has home O2 at about 2-3 L per minute. She [place] Phys[place]. Today she is feeling more tired and short of breath so she decided to check with her primary care physician who told her to come into the emergency room. Patient denied any chest pain. She's had a cough for the past 4 days initially she had some blood. She also noted mucus was mainly green. Denies any fever chills nausea vomiting or diarrhea. She also denied any significant chest pain. Patient denied leg swelling. She has baseline PND and orthopnea which has not changed. She is having difficulty breathing even with the oxygen and she is breathing hard in the emergency room with oxygen sats in the 80s off of oxygen.   Past Medical History  Diagnosis Date  . CHF (congestive heart failure)   . Hypertension   . COPD (chronic obstructive pulmonary disease)   . Angina   . Dysrhythmia 09/12/11    ventricular paced  . Shortness of breath on exertion   . Chronic lower back pain   . Headache   . Dizziness   . Gout   . Hx: UTI (urinary tract infection)   . Cardiac defibrillator in place   . Pacemaker   . Bronchitis, chronic   . Refusal of blood transfusions as patient is Jehovah's Witness     Past Surgical History  Procedure Date  . Replacement total knee 1990's    left  . Joint replacement   . Tubal ligation   . Insert / replace / remove pacemaker 05/2007    w/defibrillator    History reviewed. No pertinent family history. Social History:  reports that she has quit smoking. Her smoking use included Cigarettes. She has a 7.5 pack-year smoking history. She has never used smokeless tobacco. She reports  that she drinks alcohol. She reports that she does not use illicit drugs.  Allergies: No Known Allergies  Medications Prior to Admission  Medication Dose Route Frequency Provider Last Rate Last Dose  . albuterol (PROVENTIL) (5 MG/ML) 0.5% nebulizer solution 5 mg  5 mg Nebulization Once Sunnie Nielsen, MD   5 mg at 12/05/11 0009  . albuterol (PROVENTIL) (5 MG/ML) 0.5% nebulizer solution 5 mg  5 mg Nebulization Once Sunnie Nielsen, MD   5 mg at 12/05/11 0418  . iohexol (OMNIPAQUE) 300 MG/ML solution 100 mL  100 mL Intravenous Once PRN Medication Radiologist, MD   100 mL at 12/05/11 0247  . methylPREDNISolone sodium succinate (SOLU-MEDROL) 125 mg/2 mL injection 125 mg  125 mg Intravenous Once Sunnie Nielsen, MD   125 mg at 12/05/11 0017  . morphine 4 MG/ML injection 4 mg  4 mg Intravenous Once Sunnie Nielsen, MD   4 mg at 12/05/11 0017  . ondansetron (ZOFRAN) injection 4 mg  4 mg Intravenous Once Sunnie Nielsen, MD   4 mg at 12/05/11 0014   Medications Prior to Admission  Medication Sig Dispense Refill  . allopurinol (ZYLOPRIM) 100 MG tablet Take 100 mg by mouth every morning.       Marland Kitchen aspirin EC 81 MG tablet Take 81 mg by mouth every morning.       . levalbuterol [facility] HFA)  45 MCG/ACT inhaler Inhale 1-2 puffs into the lungs every 4 (four) hours as needed. For shortness of breath      . levalbuterol (XOPENEX) 1.25 MG/3ML nebulizer solution Take 1.25 mg by nebulization every 4 (four) hours as needed. For shortness of breath      . omeprazole (PRILOSEC) 20 MG capsule Take 20 mg by mouth 2 (two) times daily.      Marland Kitchen tiotropium (SPIRIVA) 18 MCG inhalation capsule Place 18 mcg into inhaler and inhale daily.        Results for orders placed during the hospital encounter of 12/04/11 (from the past 48 hour(s))  CBC     Status: Abnormal   Collection Time   12/04/11  7:50 PM      Component Value Range Comment   WBC 10.7 (*) 4.0 - 10.5 (K/uL)    RBC 4.47  3.87 - 5.11 (MIL/uL)    Hemoglobin 11.8 (*) 12.0 - 15.0  (g/dL)    HCT 45.4  09.8 - 11.9 (%)    MCV 81.2  78.0 - 100.0 (fL)    MCH 26.4  26.0 - 34.0 (pg)    MCHC 32.5  30.0 - 36.0 (g/dL)    RDW 14.7  82.9 - 56.2 (%)    Platelets 257  150 - 400 (K/uL)   DIFFERENTIAL     Status: Normal   Collection Time   12/04/11  7:50 PM      Component Value Range Comment   Neutrophils Relative 61  43 - 77 (%)    Neutro Abs 6.6  1.7 - 7.7 (K/uL)    Lymphocytes Relative 28  12 - 46 (%)    Lymphs Abs 3.0  0.7 - 4.0 (K/uL)    Monocytes Relative 6  3 - 12 (%)    Monocytes Absolute 0.6  0.1 - 1.0 (K/uL)    Eosinophils Relative 5  0 - 5 (%)    Eosinophils Absolute 0.5  0.0 - 0.7 (K/uL)    Basophils Relative 0  0 - 1 (%)    Basophils Absolute 0.0  0.0 - 0.1 (K/uL)   CK TOTAL AND CKMB     Status: Normal   Collection Time   12/04/11  7:58 PM      Component Value Range Comment   Total CK 113  7 - 177 (U/L)    CK, MB 1.2  0.3 - 4.0 (ng/mL)    Relative Index 1.1  0.0 - 2.5    POCT I-STAT TROPONIN I     Status: Normal   Collection Time   12/04/11  8:34 PM      Component Value Range Comment   Troponin i, poc 0.01  0.00 - 0.08 (ng/mL)    Comment 3            COMPREHENSIVE METABOLIC PANEL     Status: Abnormal   Collection Time   12/04/11 11:31 PM      Component Value Range Comment   Sodium 140  135 - 145 (mEq/L)    Potassium 3.9  3.5 - 5.1 (mEq/L)    Chloride 103  96 - 112 (mEq/L)    CO2 25  19 - 32 (mEq/L)    Glucose, Bld 73  70 - 99 (mg/dL)    BUN 20  6 - 23 (mg/dL)    Creatinine, Ser 1.30  0.50 - 1.10 (mg/dL)    Calcium 9.3  8.4 - 10.5 (mg/dL)    Total Protein 7.7  6.0 -  8.3 (g/dL)    Albumin 3.3 (*) 3.5 - 5.2 (g/dL)    AST 16  0 - 37 (U/L)    ALT 15  0 - 35 (U/L)    Alkaline Phosphatase 121 (*) 39 - 117 (U/L)    Total Bilirubin 0.2 (*) 0.3 - 1.2 (mg/dL)    GFR calc non Af Amer 58 (*) >90 (mL/min)    GFR calc Af Amer 67 (*) >90 (mL/min)   D-DIMER, QUANTITATIVE     Status: Abnormal   Collection Time   12/04/11 11:31 PM      Component Value Range Comment    D-Dimer, Quant 0.78 (*) 0.00 - 0.48 (ug/mL-FEU)    Dg Chest 2 View  12/04/2011  *RADIOLOGY REPORT*  Clinical Data: Left chest pain  CHEST - 2 VIEW  Comparison: 09/12/2011  Findings: Left subclavian pacer / AICD noted.  Stable cardiomegaly with resolving atelectasis.  Negative for CHF or pneumonia.  No effusion or pneumothorax.  Trachea midline.  IMPRESSION: Cardiomegaly without CHF or pneumonia.  Resolved atelectasis.  Original Report Authenticated By: Judie Petit. Ruel Favors, M.D.   Ct Angio Chest W/cm &/or Wo Cm  12/05/2011  *RADIOLOGY REPORT*  Clinical Data: Chest pain and shortness of breath.  CT ANGIOGRAPHY CHEST  Technique:  Multidetector CT imaging of the chest using the standard protocol during bolus administration of intravenous contrast. Multiplanar reconstructed images including MIPs were obtained and reviewed to evaluate the vascular anatomy.  Contrast: OMNIPAQUE IOHEXOL 300 MG/ML  SOLN  Comparison: Chest radiograph performed earlier today at 08:35 p.m.  Findings: There is no evidence of central pulmonary embolus. Evaluation for pulmonary embolus is suboptimal due to motion artifact.  Mild bilateral dependent subsegmental atelectasis is noted.  The lungs are otherwise grossly clear.  There is no evidence of significant focal consolidation, pleural effusion or pneumothorax. No masses are identified; no abnormal focal contrast enhancement is seen.  The heart is mildly enlarged.  Pacemaker/AICD leads are noted ending at the right atrium and right ventricle, and along the proximal coronary sinus.  No mediastinal lymphadenopathy is seen. No axillary lymphadenopathy is seen.  The visualized portions of the thyroid gland are unremarkable in appearance.  The visualized portions of the liver and spleen are unremarkable. The visualized portions of the pancreas, stomach, adrenal glands and kidneys are within normal limits.  No acute osseous abnormalities are seen.  IMPRESSION:  1.  No evidence of central  pulmonary embolus. 2.  Mild bilateral dependent subsegmental atelectasis noted; lungs otherwise clear. 3.  Mild cardiomegaly.  Original Report Authenticated By: Tonia Ghent, M.D.    Review of Systems  Constitutional: Negative for fever, chills and diaphoresis.  HENT: Positive for congestion and sore throat.   Eyes: Negative.   Respiratory: Positive for cough, hemoptysis, sputum production, shortness of breath and wheezing.   Cardiovascular: Negative.   Gastrointestinal: Negative.   Genitourinary: Negative.   Musculoskeletal: Negative.   Neurological: Negative.  Negative for weakness.  Endo/Heme/Allergies: Negative.   Psychiatric/Behavioral: Negative.     Blood pressure 106/72, pulse 98, temperature 98.1 F (36.7 C), temperature source Oral, resp. rate 20, SpO2 95.00%. Physical Exam  Constitutional: She is oriented to person, place, and time. She appears well-developed and well-nourished.  HENT:  Head: Normocephalic and atraumatic.  Right Ear: External ear normal.  Left Ear: External ear normal.  Nose: Nose normal.  Mouth/Throat: Oropharynx is clear and moist.  Eyes: Conjunctivae and EOM are normal. Pupils are equal, round, and reactive to light.  Neck:  Normal range of motion. Neck supple.  Cardiovascular: Regular rhythm, normal heart sounds and intact distal pulses.  Tachycardia present.   No murmur heard. Respiratory: She is in respiratory distress. She has wheezes. She has no rales. She exhibits no tenderness.  GI: Soft. Bowel sounds are normal.  Musculoskeletal: Normal range of motion.  Neurological: She is alert and oriented to person, place, and time. She has normal reflexes.  Skin: Skin is warm and dry.  Psychiatric: She has a normal mood and affect. Her behavior is normal. Judgment and thought content normal.     Assessment/Plan Assessment this is a 61 year old female with known history of both COPD and CHF presenting with worsening shortness of breath she is in  respiratory distress but no evidence of pulmonary edema on her chest x-ray no evidence of pneumonia given the patient complained of hemoptysis. From all indications she is having COPD exacerbation at this point. She calls some acute on chronic bronchitis with reported history of a grandchild being sick with some cold symptoms and she may have been exposed.  1 COPD exacerbation: Patient will be admitted for medical treatment in-house. We will put on oxygen and nebulizers prefab is Xopenex nebulizer with ipratropium. Will put her on steroids and antibiotics the continue with oxygen therapy also. Once she improves we'll transition her to her home medications and her chronic oxygen use. We will for other differentials include any cardiac causes by checking her BNP and also for cycling enzymes if necessary. #2 hypertension: We will treat house her home medications and titrate them as necessary. Her blood pressure seems to be okay at this point. #3 dilated cardiomyopathy.: She is said to have compensated CHF at this point. We will follow patient closely again as he will we will do in terms of adjusting her medications as needed #4 Morbid obesity: Patient is respiratory vocal also be related to a pickwickian type syndrome right-sided pain that was pulmonary hypertension. I will check how ABG at this point. We'll continue with the oxygenation as well as encouraging dietary and exercise for the patient as tolerated.  Goldie Tregoning,LAWAL 12/05/2011, 4:55 AM

## 2011-12-05 NOTE — Progress Notes (Signed)
Pt admitted to room 4739. VS WDL, pt placed on tele and 3 liters O2.  Will carry out MD orders.

## 2011-12-05 NOTE — ED Notes (Signed)
Pt ambulated approx  35 feet off oxygen withO2 sats  between 80-86%, tachycardic 130's and tachypneic 30's.  Returned to bed, 2l [place] applied, c/o left CP with inspiration.  RUL and RLL with exp wheezes.  Dr Mikeal Hawthorne at bedside

## 2011-12-05 NOTE — ED Provider Notes (Signed)
History     CSN: [id number]  Arrival date & time 12/04/11  1933   First MD Initiated Contact with Patient 12/04/11 2301      Chief Complaint  Patient presents with  . Shortness of Breath    (Consider location/radiation/quality/duration/timing/severity/associated sxs/prior treatment) Patient is a 61 y.o. female presenting with shortness of breath. The history is provided by the patient.  Shortness of Breath  The current episode started today. The problem occurs continuously. The problem has been unchanged. The problem is moderate. Associated symptoms include chest pain, cough and shortness of breath. Pertinent negatives include no fever.   a mild cough but no fevers. Has history of COPD and CHF. Also has a pacemaker but denies any history of heart attack or coronary disease. Her physicians are located at [facility]. She denies any leg pain or swelling. She also has associated substernal chest pain with her symptoms tonight. No history of similar symptoms in the past. Moderate in severity. No radiation of pain. Sharp and somewhat pressure-like as well. Past Medical History  Diagnosis Date  . CHF (congestive heart failure)   . Hypertension   . COPD (chronic obstructive pulmonary disease)   . Angina   . Dysrhythmia 09/12/11    ventricular paced  . Shortness of breath on exertion   . Chronic lower back pain   . Headache   . Dizziness   . Gout   . Hx: UTI (urinary tract infection)   . Cardiac defibrillator in place   . Pacemaker   . Bronchitis, chronic   . Refusal of blood transfusions as patient is Jehovah's Witness     Past Surgical History  Procedure Date  . Replacement total knee 1990's    left  . Joint replacement   . Tubal ligation   . Insert / replace / remove pacemaker 05/2007    w/defibrillator    No family history on file.  History  Substance Use Topics  . Smoking status: Former Smoker -- 0.5 packs/day for 15 years    Types: Cigarettes  . Smokeless tobacco:  Never Used   Comment: quit smoking cigarettes ~ 2007  . Alcohol Use: Yes     09/12/11 "rarely drink; not even once a month"    OB History    Grav Para Term Preterm Abortions TAB SAB Ect Mult Living                  Review of Systems  Constitutional: Negative for fever and chills.  HENT: Negative for neck pain and neck stiffness.   Eyes: Negative for pain.  Respiratory: Positive for cough and shortness of breath.   Cardiovascular: Positive for chest pain.  Gastrointestinal: Negative for abdominal pain.  Genitourinary: Negative for dysuria.  Musculoskeletal: Negative for back pain.  Skin: Negative for rash.  Neurological: Negative for headaches.  All other systems reviewed and are negative.    Allergies  Review of patient's allergies indicates no known allergies.  Home Medications   Current Outpatient Rx  Name Route Sig Dispense Refill  . ALLOPURINOL 100 MG PO TABS Oral Take 100 mg by mouth every morning.     . ASPIRIN EC 81 MG PO TBEC Oral Take 81 mg by mouth every morning.     . FUROSEMIDE 20 MG PO TABS Oral Take 20 mg by mouth every morning. MAY TAKE 1 ADDITIONAL TAB IF NEEDED FOR FLUID RETENTION    . LEVALBUTEROL TARTRATE 45 MCG/ACT IN AERO Inhalation Inhale 1-2 puffs into the  lungs every 4 (four) hours as needed. For shortness of breath    . LEVALBUTEROL HCL 1.25 MG/3ML IN NEBU Nebulization Take 1.25 mg by nebulization every 4 (four) hours as needed. For shortness of breath    . METOPROLOL SUCCINATE ER 200 MG PO TB24 Oral Take 200 mg by mouth every morning.    Marland Kitchen OMEPRAZOLE 20 MG PO CPDR Oral Take 20 mg by mouth 2 (two) times daily.    Marland Kitchen SPIRONOLACTONE 50 MG PO TABS Oral Take 50 mg by mouth every morning.    Marland Kitchen TIOTROPIUM BROMIDE MONOHYDRATE 18 MCG IN CAPS Inhalation Place 18 mcg into inhaler and inhale daily.    Marland Kitchen VALSARTAN 160 MG PO TABS Oral Take 160 mg by mouth every morning.      BP 106/72  Pulse 98  Temp(Src) 98.1 F (36.7 C) (Oral)  Resp 20  SpO2  95%  Physical Exam  Constitutional: She is oriented to person, place, and time. She appears well-developed and well-nourished.  HENT:  Head: Normocephalic and atraumatic.  Eyes: Conjunctivae and EOM are normal. Pupils are equal, round, and reactive to light.  Neck: Trachea normal. Neck supple. No thyromegaly present.  Cardiovascular: Normal rate, regular rhythm, S1 normal, S2 normal and normal pulses.     No systolic murmur is present   No diastolic murmur is present  Pulses:      Radial pulses are 2+ on the right side, and 2+ on the left side.  Pulmonary/Chest: Effort normal. She has no rhonchi. She exhibits no tenderness.       Decreased breath sounds bilaterally with mild expiratory wheezes  Abdominal: Soft. Normal appearance and bowel sounds are normal. There is no tenderness. There is no CVA tenderness and negative Murphy's sign.  Musculoskeletal:       BLE:s Calves nontender, no cords or erythema, negative Homans sign  Neurological: She is alert and oriented to person, place, and time. She has normal strength. No cranial nerve deficit or sensory deficit. GCS eye subscore is 4. GCS verbal subscore is 5. GCS motor subscore is 6.  Skin: Skin is warm and dry. No rash noted. She is not diaphoretic.  Psychiatric: Her speech is normal.       Cooperative and appropriate    ED Course  Procedures (including critical care time)  Labs Reviewed  CBC - Abnormal; Notable for the following:    WBC 10.7 (*)    Hemoglobin 11.8 (*)    All other components within normal limits  COMPREHENSIVE METABOLIC PANEL - Abnormal; Notable for the following:    Albumin 3.3 (*)    Alkaline Phosphatase 121 (*)    Total Bilirubin 0.2 (*)    GFR calc non Af Amer 58 (*)    GFR calc Af Amer 67 (*)    All other components within normal limits  D-DIMER, QUANTITATIVE - Abnormal; Notable for the following:    D-Dimer, Quant 0.78 (*)    All other components within normal limits  DIFFERENTIAL  CK TOTAL AND CKMB   POCT I-STAT TROPONIN I  CARDIAC PANEL(CRET KIN+CKTOT+MB+TROPI)   Dg Chest 2 View  12/04/2011  *RADIOLOGY REPORT*  Clinical Data: Left chest pain  CHEST - 2 VIEW  Comparison: 09/12/2011  Findings: Left subclavian pacer / AICD noted.  Stable cardiomegaly with resolving atelectasis.  Negative for CHF or pneumonia.  No effusion or pneumothorax.  Trachea midline.  IMPRESSION: Cardiomegaly without CHF or pneumonia.  Resolved atelectasis.  Original Report Authenticated By: Judie Petit. TREVOR SHICK,  M.D.   Ct Angio Chest W/cm &/or Wo Cm  12/05/2011  *RADIOLOGY REPORT*  Clinical Data: Chest pain and shortness of breath.  CT ANGIOGRAPHY CHEST  Technique:  Multidetector CT imaging of the chest using the standard protocol during bolus administration of intravenous contrast. Multiplanar reconstructed images including MIPs were obtained and reviewed to evaluate the vascular anatomy.  Contrast: OMNIPAQUE IOHEXOL 300 MG/ML  SOLN  Comparison: Chest radiograph performed earlier today at 08:35 p.m.  Findings: There is no evidence of central pulmonary embolus. Evaluation for pulmonary embolus is suboptimal due to motion artifact.  Mild bilateral dependent subsegmental atelectasis is noted.  The lungs are otherwise grossly clear.  There is no evidence of significant focal consolidation, pleural effusion or pneumothorax. No masses are identified; no abnormal focal contrast enhancement is seen.  The heart is mildly enlarged.  Pacemaker/AICD leads are noted ending at the right atrium and right ventricle, and along the proximal coronary sinus.  No mediastinal lymphadenopathy is seen. No axillary lymphadenopathy is seen.  The visualized portions of the thyroid gland are unremarkable in appearance.  The visualized portions of the liver and spleen are unremarkable. The visualized portions of the pancreas, stomach, adrenal glands and kidneys are within normal limits.  No acute osseous abnormalities are seen.  IMPRESSION:  1.  No evidence of  central pulmonary embolus. 2.  Mild bilateral dependent subsegmental atelectasis noted; lungs otherwise clear. 3.  Mild cardiomegaly.  Original Report Authenticated By: Tonia Ghent, M.D.    Date: 12/05/2011  Rate: 110  Rhythm: Paced  QRS Axis: left  Intervals: normal  ST/T Wave abnormalities: nonspecific ST/T changes  Conduction Disutrbances:nonspecific intraventricular conduction delay  Narrative Interpretation:   Old EKG Reviewed: none available  Intermittent improvement with albuterol treatments. Lung exam unchanged. Pain improved with morphine. For elevated d-dimer CT  was obtained and no PE visualized.  She remains symptomatic with borderline sats on oxygen at 4 AM. Medicine consult for admission   MDM   Chest pain and shortness of breath with some element of COPD. Cardiac markers negative an EKG paced. labs obtained and reviewed as above.  4:23 AM taste discussed with Dr. Mikeal Hawthorne- will evaluate in the emergency department for admission         Sunnie Nielsen, MD 12/05/11 [phone]

## 2011-12-05 NOTE — ED Notes (Signed)
CT notified of #20 gauge IV for CT angio

## 2011-12-05 NOTE — ED Notes (Signed)
Report called to [id number] RN 

## 2011-12-06 LAB — COMPREHENSIVE METABOLIC PANEL
ALT: 14 U/L (ref 0–35)
Albumin: 3 g/dL — ABNORMAL LOW (ref 3.5–5.2)
Alkaline Phosphatase: 116 U/L (ref 39–117)
BUN: 41 mg/dL — ABNORMAL HIGH (ref 6–23)
Chloride: 101 mEq/L (ref 96–112)
Potassium: 5 mEq/L (ref 3.5–5.1)
Sodium: 136 mEq/L (ref 135–145)
Total Bilirubin: 0.1 mg/dL — ABNORMAL LOW (ref 0.3–1.2)
Total Protein: 7.7 g/dL (ref 6.0–8.3)

## 2011-12-06 LAB — CBC
HCT: 36.9 % (ref 36.0–46.0)
Hemoglobin: 12 g/dL (ref 12.0–15.0)
RDW: 15.5 % (ref 11.5–15.5)
WBC: 17.5 10*3/uL — ABNORMAL HIGH (ref 4.0–10.5)

## 2011-12-06 LAB — CARDIAC PANEL(CRET KIN+CKTOT+MB+TROPI)
CK, MB: 2.6 ng/mL (ref 0.3–4.0)
Relative Index: 2.1 (ref 0.0–2.5)
Total CK: 126 U/L (ref 7–177)
Troponin I: <0.3 ng/mL (ref <0.30)

## 2011-12-06 MED ORDER — SODIUM CHLORIDE 0.9 % IV SOLN
INTRAVENOUS | Status: DC
  Administered 2011-12-06 – 2011-12-08 (×3): via INTRAVENOUS

## 2011-12-06 MED ORDER — PREDNISONE 50 MG PO TABS
50.0000 mg | ORAL_TABLET | Freq: Every day | ORAL | Status: DC
  Administered 2011-12-07: 50 mg via ORAL
  Filled 2011-12-06 (×2): qty 1

## 2011-12-06 MED ORDER — SODIUM CHLORIDE 0.9 % IV SOLN: 250.0000 mL | INTRAVENOUS | Status: DC | PRN

## 2011-12-06 MED ORDER — IPRATROPIUM BROMIDE 0.02 % IN SOLN: 0.5000 mg | RESPIRATORY_TRACT | Status: DC | PRN

## 2011-12-06 NOTE — Progress Notes (Signed)
Utilization Review Completed.Monique Kane T4/05/2012   

## 2011-12-06 NOTE — Progress Notes (Signed)
Monique Kane is a 61 y.o. female admitted with cough and sob, she is being treated for copd exacerbation.  SUBJECTIVE Feels better, less sob.   1. COPD exacerbation   2. Chest pain     Past Medical History  Diagnosis Date  . CHF (congestive heart failure)   . Hypertension   . COPD (chronic obstructive pulmonary disease)   . Angina   . Dysrhythmia 09/12/11    ventricular paced  . Shortness of breath on exertion   . Chronic lower back pain   . Headache   . Dizziness   . Gout   . Hx: UTI (urinary tract infection)   . Cardiac defibrillator in place   . Pacemaker   . Bronchitis, chronic   . Refusal of blood transfusions as patient is Jehovah's Witness   . Arthritis     hx of gout   Current Facility-Administered Medications  Medication Dose Route Frequency Provider Last Rate Last Dose  . 0.9 %  sodium chloride infusion  250 mL Intravenous PRN Kirsty Monjaraz, MD      . allopurinol (ZYLOPRIM) tablet 100 mg  100 mg Oral Daily Rometta Emery, MD   100 mg at 12/06/11 1057  . aspirin EC tablet 81 mg  81 mg Oral Daily Rometta Emery, MD   81 mg at 12/06/11 1057  . enoxaparin (LOVENOX) injection 40 mg  40 mg Subcutaneous Q24H Rometta Emery, MD   40 mg at 12/06/11 1624  . guaiFENesin (MUCINEX) 12 hr tablet 600 mg  600 mg Oral BID Rometta Emery, MD   600 mg at 12/06/11 1057  . ipratropium (ATROVENT) nebulizer solution 0.5 mg  0.5 mg Nebulization Q6H Rometta Emery, MD   0.5 mg at 12/06/11 1458  . levalbuterol (XOPENEX) nebulizer solution 1.25 mg  1.25 mg Nebulization Q4H PRN Rometta Emery, MD   1.25 mg at 12/06/11 1458  . metoprolol succinate (TOPROL-XL) 24 hr tablet 200 mg  200 mg Oral Daily Rometta Emery, MD   200 mg at 12/06/11 1057  . morphine 2 MG/ML injection 1 mg  1 mg Intravenous Q4H PRN Rometta Emery, MD   1 mg at 12/06/11 1439  . moxifloxacin (AVELOX) IVPB 400 mg  400 mg Intravenous Q24H Rometta Emery, MD   400 mg at 12/06/11 1200  . ondansetron  (ZOFRAN) tablet 4 mg  4 mg Oral Q6H PRN Rometta Emery, MD       Or  . ondansetron (ZOFRAN) injection 4 mg  4 mg Intravenous Q6H PRN Rometta Emery, MD      . pantoprazole (PROTONIX) EC tablet 40 mg  40 mg Oral Q1200 Rometta Emery, MD   40 mg at 12/06/11 1257  . predniSONE (DELTASONE) tablet 50 mg  50 mg Oral Q breakfast Sammie Denner, MD      . sodium chloride 0.9 % injection 3 mL  3 mL Intravenous Q12H Rometta Emery, MD   3 mL at 12/06/11 1000  . sodium chloride 0.9 % injection 3 mL  3 mL Intravenous PRN Rometta Emery, MD      . spironolactone (ALDACTONE) tablet 50 mg  50 mg Oral Daily Rometta Emery, MD   50 mg at 12/06/11 1058  . DISCONTD: 0.9 %  sodium chloride infusion  250 mL Intravenous PRN Rometta Emery, MD      . DISCONTD: furosemide (LASIX) tablet 20 mg  20 mg Oral Daily Rometta Emery,  MD   20 mg at 12/06/11 1057  . DISCONTD: irbesartan (AVAPRO) tablet 75 mg  75 mg Oral Daily Rometta Emery, MD   75 mg at 12/06/11 1057  . DISCONTD: predniSONE (DELTASONE) tablet 60 mg  60 mg Oral BID WC Rometta Emery, MD   60 mg at 12/06/11 1624   No Known Allergies Principal Problem:  *Respiratory distress Active Problems:  Dilated cardiomyopathy  Hypertension  COPD exacerbation  Morbid obesity   Vital signs in last 24 hours: Temp:  [97.4 F (36.3 C)-99.2 F (37.3 C)] 98 F (36.7 C) (04/10 1400) Pulse Rate:  [71-80] 71  (04/10 1400) Resp:  [18-20] 18  (04/10 1400) BP: (111-136)/(66-82) 128/82 mmHg (04/10 1400) SpO2:  [94 %-99 %] 99 % (04/10 1455) Weight:  [130.364 kg (287 lb 6.4 oz)] 130.364 kg (287 lb 6.4 oz) (04/10 0642) Weight change:  Last BM Date: 12/03/11  Intake/Output from previous day: 04/09 0701 - 04/10 0700 In: 1023 [P.O.:1020; I.V.:3] Out: 1000 [Urine:1000] Intake/Output this shift: Total I/O In: 1100 [P.O.:600; IV Piggyback:500] Out: 1400 [Urine:1400]  Lab Results:  Basename 12/06/11 0640 12/05/11 1015  WBC 17.5* 12.2*  HGB 12.0 11.8*    HCT 36.9 35.9*  PLT 268 249   BMET  Basename 12/06/11 0640 12/05/11 1015 12/04/11 2331  NA 136 -- 140  K 5.0 -- 3.9  CL 101 -- 103  CO2 24 -- 25  GLUCOSE 152* -- 73  BUN 41* -- 20  CREATININE 1.80* 1.15* --  CALCIUM 9.4 -- 9.3    Studies/Results: Dg Chest 2 View  12/04/2011  *RADIOLOGY REPORT*  Clinical Data: Left chest pain  CHEST - 2 VIEW  Comparison: 09/12/2011  Findings: Left subclavian pacer / AICD noted.  Stable cardiomegaly with resolving atelectasis.  Negative for CHF or pneumonia.  No effusion or pneumothorax.  Trachea midline.  IMPRESSION: Cardiomegaly without CHF or pneumonia.  Resolved atelectasis.  Original Report Authenticated By: Judie Petit. Ruel Favors, M.D.   Ct Angio Chest W/cm &/or Wo Cm  12/05/2011  *RADIOLOGY REPORT*  Clinical Data: Chest pain and shortness of breath.  CT ANGIOGRAPHY CHEST  Technique:  Multidetector CT imaging of the chest using the standard protocol during bolus administration of intravenous contrast. Multiplanar reconstructed images including MIPs were obtained and reviewed to evaluate the vascular anatomy.  Contrast: OMNIPAQUE IOHEXOL 300 MG/ML  SOLN  Comparison: Chest radiograph performed earlier today at 08:35 p.m.  Findings: There is no evidence of central pulmonary embolus. Evaluation for pulmonary embolus is suboptimal due to motion artifact.  Mild bilateral dependent subsegmental atelectasis is noted.  The lungs are otherwise grossly clear.  There is no evidence of significant focal consolidation, pleural effusion or pneumothorax. No masses are identified; no abnormal focal contrast enhancement is seen.  The heart is mildly enlarged.  Pacemaker/AICD leads are noted ending at the right atrium and right ventricle, and along the proximal coronary sinus.  No mediastinal lymphadenopathy is seen. No axillary lymphadenopathy is seen.  The visualized portions of the thyroid gland are unremarkable in appearance.  The visualized portions of the liver and spleen  are unremarkable. The visualized portions of the pancreas, stomach, adrenal glands and kidneys are within normal limits.  No acute osseous abnormalities are seen.  IMPRESSION:  1.  No evidence of central pulmonary embolus. 2.  Mild bilateral dependent subsegmental atelectasis noted; lungs otherwise clear. 3.  Mild cardiomegaly.  Original Report Authenticated By: Tonia Ghent, M.D.    Medications: I have reviewed the  patient's current medications.   Physical exam GENERAL- alert HEAD- normal atraumatic, no neck masses, normal thyroid, no jvd RESPIRATORY- some occasional cough, no wheezing.CVS- regular rate and rhythm, S1, S2 normal, no murmur, click, rub or gallop ABDOMEN- abdomen is soft without significant tenderness, masses, organomegaly or guarding NEURO- Grossly normal EXTREMITIES- extremities normal, atraumatic, no cyanosis or edema  Plan   *Respiratory distress due to COPD exacerbation- better. High wbc concerning. May be steroid induced. Reduce prednisone. Continue avelox. * Dilated cardiomyopathy/ Hypertension/ Morbid obesity- stable. Monitor. Hold arb due to worsening renal failure. *AKI- ? contrast induced. Gentle fluids. Dvt/gi prophylaxis.    Monique Kane 12/06/2011 6:54 PM Pager: [phone].

## 2011-12-07 LAB — CBC
MCH: 26.1 pg (ref 26.0–34.0)
MCV: 79.4 fL (ref 78.0–100.0)
Platelets: 262 10*3/uL (ref 150–400)
RBC: 4.67 MIL/uL (ref 3.87–5.11)

## 2011-12-07 LAB — COMPREHENSIVE METABOLIC PANEL
AST: 15 U/L (ref 0–37)
CO2: 22 mEq/L (ref 19–32)
Calcium: 9.1 mg/dL (ref 8.4–10.5)
Creatinine, Ser: 1.44 mg/dL — ABNORMAL HIGH (ref 0.50–1.10)
GFR calc Af Amer: 45 mL/min — ABNORMAL LOW (ref >90)
GFR calc non Af Amer: 39 mL/min — ABNORMAL LOW (ref >90)
Glucose, Bld: 136 mg/dL — ABNORMAL HIGH (ref 70–99)

## 2011-12-07 LAB — PRO B NATRIURETIC PEPTIDE: Pro B Natriuretic peptide (BNP): 238.2 pg/mL — ABNORMAL HIGH (ref 0–125)

## 2011-12-07 MED ORDER — PREDNISONE 20 MG PO TABS
40.0000 mg | ORAL_TABLET | Freq: Every day | ORAL | Status: DC
  Administered 2011-12-08: 40 mg via ORAL
  Filled 2011-12-07 (×2): qty 2

## 2011-12-07 MED ORDER — MOXIFLOXACIN HCL 400 MG PO TABS
400.0000 mg | ORAL_TABLET | Freq: Every day | ORAL | Status: DC
  Filled 2011-12-07: qty 1

## 2011-12-07 MED ORDER — SODIUM POLYSTYRENE SULFONATE 15 GM/60ML PO SUSP
15.0000 g | Freq: Once | ORAL | Status: AC
  Administered 2011-12-07: 15 g via ORAL
  Filled 2011-12-07: qty 60

## 2011-12-07 NOTE — Progress Notes (Signed)
SUBJECTIVE Feels better. No SOB.   1. COPD exacerbation   2. Chest pain     Past Medical History  Diagnosis Date  . CHF (congestive heart failure)   . Hypertension   . COPD (chronic obstructive pulmonary disease)   . Angina   . Dysrhythmia 09/12/11    ventricular paced  . Shortness of breath on exertion   . Chronic lower back pain   . Headache   . Dizziness   . Gout   . Hx: UTI (urinary tract infection)   . Cardiac defibrillator in place   . Pacemaker   . Bronchitis, chronic   . Refusal of blood transfusions as patient is Jehovah's Witness   . Arthritis     hx of gout   Current Facility-Administered Medications  Medication Dose Route Frequency Provider Last Rate Last Dose  . 0.9 %  sodium chloride infusion   Intravenous Continuous [place] Troia, MD 50 mL/hr at 12/07/11 1903    . aspirin EC tablet 81 mg  81 mg Oral Daily Rometta Emery, MD   81 mg at 12/07/11 1042  . enoxaparin (LOVENOX) injection 40 mg  40 mg Subcutaneous Q24H Rometta Emery, MD   40 mg at 12/07/11 1637  . guaiFENesin (MUCINEX) 12 hr tablet 600 mg  600 mg Oral BID Rometta Emery, MD   600 mg at 12/07/11 1045  . ipratropium (ATROVENT) nebulizer solution 0.5 mg  0.5 mg Nebulization Q4H PRN Ameya Kutz, MD      . levalbuterol (XOPENEX) nebulizer solution 1.25 mg  1.25 mg Nebulization Q4H PRN Rometta Emery, MD   1.25 mg at 12/06/11 1458  . metoprolol succinate (TOPROL-XL) 24 hr tablet 200 mg  200 mg Oral Daily Rometta Emery, MD   200 mg at 12/07/11 1042  . morphine 2 MG/ML injection 1 mg  1 mg Intravenous Q4H PRN Rometta Emery, MD   1 mg at 12/07/11 0900  . moxifloxacin (AVELOX) IVPB 400 mg  400 mg Intravenous Q24H Kimberly Ballard Hammons, PHARMD   400 mg at 12/07/11 1140  . moxifloxacin (AVELOX) tablet 400 mg  400 mg Oral q1800 Kimberly Ballard Hammons, PHARMD      . ondansetron (ZOFRAN) tablet 4 mg  4 mg Oral Q6H PRN Rometta Emery, MD       Or  . ondansetron (ZOFRAN) injection 4 mg  4 mg  Intravenous Q6H PRN Rometta Emery, MD      . pantoprazole (PROTONIX) EC tablet 40 mg  40 mg Oral Q1200 Rometta Emery, MD   40 mg at 12/07/11 1206  . predniSONE (DELTASONE) tablet 50 mg  50 mg Oral Q breakfast Ewing Fandino, MD   50 mg at 12/07/11 0749  . sodium chloride 0.9 % injection 3 mL  3 mL Intravenous Q12H Rometta Emery, MD   3 mL at 12/07/11 1046  . sodium chloride 0.9 % injection 3 mL  3 mL Intravenous PRN Rometta Emery, MD      . sodium polystyrene (KAYEXALATE) 15 GM/60ML suspension 15 g  15 g Oral Once Conley Canal, MD   15 g at 12/07/11 1902  . DISCONTD: allopurinol (ZYLOPRIM) tablet 100 mg  100 mg Oral Daily Rometta Emery, MD   100 mg at 12/07/11 1042  . DISCONTD: ipratropium (ATROVENT) nebulizer solution 0.5 mg  0.5 mg Nebulization Q6H Rometta Emery, MD   0.5 mg at 12/06/11 2034  . DISCONTD: spironolactone (ALDACTONE) tablet 50 mg  50 mg Oral Daily Rometta Emery, MD   50 mg at 12/07/11 1045   No Known Allergies Principal Problem:  *Respiratory distress Active Problems:  Dilated cardiomyopathy  Hypertension  COPD exacerbation  Morbid obesity   Vital signs in last 24 hours: Temp:  [97.8 F (36.6 C)-98.4 F (36.9 C)] 97.8 F (36.6 C) (04/11 1404) Pulse Rate:  [67-70] 67  (04/11 1404) Resp:  [18-20] 20  (04/11 1404) BP: (93-133)/(55-76) 104/55 mmHg (04/11 1404) SpO2:  [96 %-100 %] 96 % (04/11 1404) Weight:  [131.452 kg (289 lb 12.8 oz)] 131.452 kg (289 lb 12.8 oz) (04/11 0552) Weight change: 2.223 kg (4 lb 14.4 oz) Last BM Date: 12/03/11  Intake/Output from previous day: 04/10 0701 - 04/11 0700 In: 1340 [P.O.:840; IV Piggyback:500] Out: 2200 [Urine:2200] Intake/Output this shift:    Lab Results:  Basename 12/07/11 0631 12/06/11 0640  WBC 13.7* 17.5*  HGB 12.2 12.0  HCT 37.1 36.9  PLT 262 268   BMET  Basename 12/07/11 0855 12/06/11 0640  NA 136 136  K 5.3* 5.0  CL 104 101  CO2 22 24  GLUCOSE 136* 152*  BUN 45* 41*  CREATININE  1.44* 1.80*  CALCIUM 9.1 9.4    Studies/Results: No results found.  Medications: I have reviewed the patient's current medications.   Physical exam GENERAL- alert HEAD- normal atraumatic, no neck masses, normal thyroid, no jvd RESPIRATORY- appears well, vitals normal, no respiratory distress, acyanotic, normal RR, ear and throat exam is normal, neck free of mass or lymphadenopathy, chest clear, no wheezing, crepitations, rhonchi, normal symmetric air entry CVS- regular rate and rhythm, S1, S2 normal, no murmur, click, rub or gallop ABDOMEN- abdomen is soft without significant tenderness, masses, organomegaly or guarding NEURO- Grossly normal EXTREMITIES- extremities normal, atraumatic, no cyanosis or edema  Plan  *Respiratory distress due to COPD exacerbation- better. Continue abx/steroids. * Dilated cardiomyopathy/ Hypertension/ Morbid obesity- stable. Monitor. Hold arb due to worsening renal failure.  *AKI- ? contrast induced. Continue Gentle fluids.  Dvt/gi prophylaxis. Disposition- possible d/c in am.     [name] 12/07/2011 7:43 PM Pager: [phone].

## 2011-12-07 NOTE — Progress Notes (Signed)
PHARMACIST - PHYSICIAN COMMUNICATION DR:   [facility] CONCERNING: Antibiotic IV to Oral Route Change Policy  RECOMMENDATION: This patient is receiving Avelox by the intravenous route.  Based on criteria approved by the Pharmacy and Therapeutics Committee, the antibiotic(s) is/are being converted to the equivalent oral dose form(s).   DESCRIPTION: These criteria include:  Patient being treated for a respiratory tract infection, urinary tract infection, or cellulitis  The patient is not neutropenic and does not exhibit a GI malabsorption state  The patient is eating (either orally or via tube) and/or has been taking other orally administered medications for a least 24 hours  The patient is improving clinically and has a Tmax < 100.5  If you have questions about this conversion, please contact the Pharmacy Department  []  ( [phone]  [place] [x]  ( [phone] )  [place]  []  ( [phone]  [facility] []  ( [phone] )  [facility]    

## 2011-12-08 LAB — BASIC METABOLIC PANEL
CO2: 21 mEq/L (ref 19–32)
Chloride: 107 mEq/L (ref 96–112)
Potassium: 4.7 mEq/L (ref 3.5–5.1)
Sodium: 140 mEq/L (ref 135–145)

## 2011-12-08 LAB — CBC
Platelets: 265 10*3/uL (ref 150–400)
RBC: 4.63 MIL/uL (ref 3.87–5.11)
WBC: 13.3 10*3/uL — ABNORMAL HIGH (ref 4.0–10.5)

## 2011-12-08 MED ORDER — DOCUSATE SODIUM 100 MG PO CAPS
100.0000 mg | ORAL_CAPSULE | Freq: Two times a day (BID) | ORAL | Status: DC
  Administered 2011-12-08 (×2): 100 mg via ORAL
  Filled 2011-12-08 (×3): qty 1

## 2011-12-08 MED ORDER — LEVOFLOXACIN 500 MG PO TABS: 500.0000 mg | ORAL_TABLET | Freq: Every day | ORAL | Status: AC

## 2011-12-08 MED ORDER — PREDNISONE 20 MG PO TABS: ORAL_TABLET | ORAL | Status: DC

## 2011-12-08 NOTE — Discharge Summary (Signed)
DISCHARGE SUMMARY  Monique Kane  MR#: [id number]  DOB:1951/08/22  Date of Admission: 12/04/2011 Date of Discharge: 12/08/2011  Attending Physician:Allanah Mcfarland  Patient's ONG:EXBMW, Monique Cape, NP, NP  Consults: none.  Discharge Diagnoses: Present on Admission:  .Respiratory distress .COPD exacerbation .Dilated cardiomyopathy .Hypertension .Morbid obesity    Hospital Course: Monique Kane was admitted on 12/05/11 with shortness of breath. She was found to have acute exacerbation of COPD. No PE on ct angio study, some dependent atelectasis bilaterally. She received abx/bronchodilators/O2/steroids, and improved clinically. Her wbc bumped up to 17000(/steroid element, but is trending downwards(13000 today) with steroid taper. She also had a bump in her creatinine to 1.8 on 12/06/11, but is trending towards baseline, at 1.37 today. The creatinine bump likely a result combination of meds(diuretics/arb/contrast). Monique Kane feels much better today, and is at her baseline. She will d/c home to follow with her PCP in 1 week. She will complete a few more days of steroids/abx.   Medication List  As of 12/08/2011  1:46 PM   TAKE these medications         allopurinol 100 MG tablet   Commonly known as: ZYLOPRIM   Take 100 mg by mouth every morning.      aspirin EC 81 MG tablet   Take 81 mg by mouth every morning.      furosemide 20 MG tablet   Commonly known as: LASIX   Take 20 mg by mouth every morning. MAY TAKE 1 ADDITIONAL TAB IF NEEDED FOR FLUID RETENTION      levalbuterol 1.25 MG/3ML nebulizer solution   Commonly known as: XOPENEX   Take 1.25 mg by nebulization every 4 (four) hours as needed. For shortness of breath      levalbuterol 45 MCG/ACT inhaler   Commonly known as: XOPENEX HFA   Inhale 1-2 puffs into the lungs every 4 (four) hours as needed. For shortness of breath      levofloxacin 500 MG tablet   Commonly known as: LEVAQUIN   Take 1 tablet (500 mg total) by mouth  daily.      metoprolol 200 MG 24 hr tablet   Commonly known as: TOPROL-XL   Take 200 mg by mouth every morning.      omeprazole 20 MG capsule   Commonly known as: PRILOSEC   Take 20 mg by mouth 2 (two) times daily.      predniSONE 20 MG tablet   Commonly known as: DELTASONE   Take 3 tablets daily for 2 days, then 2 tablets daily for 2 days, then 1 tablet daily for 3 days.      spironolactone 50 MG tablet   Commonly known as: ALDACTONE   Take 50 mg by mouth every morning.      tiotropium 18 MCG inhalation capsule   Commonly known as: SPIRIVA   Place 18 mcg into inhaler and inhale daily.      valsartan 160 MG tablet   Commonly known as: DIOVAN   Take 160 mg by mouth every morning.             Day of Discharge BP 121/78  Pulse 68  Temp(Src) 97.6 F (36.4 C) (Oral)  Resp 20  Ht 5\' 3"  (1.6 m)  Wt 131.725 kg (290 lb 6.4 oz)  BMI 51.44 kg/m2  SpO2 98%  Physical Exam: At baseline.  Results for orders placed during the hospital encounter of 12/04/11 (from the past 24 hour(s))  CBC     Status: Abnormal  Collection Time   12/08/11  6:56 AM      Component Value Range   WBC 13.3 (*) 4.0 - 10.5 (K/uL)   RBC 4.63  3.87 - 5.11 (MIL/uL)   Hemoglobin 12.1  12.0 - 15.0 (g/dL)   HCT 40.9  81.1 - 91.4 (%)   MCV 79.7  78.0 - 100.0 (fL)   MCH 26.1  26.0 - 34.0 (pg)   MCHC 32.8  30.0 - 36.0 (g/dL)   RDW 78.2  95.6 - 21.3 (%)   Platelets 265  150 - 400 (K/uL)  BASIC METABOLIC PANEL     Status: Abnormal   Collection Time   12/08/11  6:56 AM      Component Value Range   Sodium 140  135 - 145 (mEq/L)   Potassium 4.7  3.5 - 5.1 (mEq/L)   Chloride 107  96 - 112 (mEq/L)   CO2 21  19 - 32 (mEq/L)   Glucose, Bld 103 (*) 70 - 99 (mg/dL)   BUN 39 (*) 6 - 23 (mg/dL)   Creatinine, Ser 0.86 (*) 0.50 - 1.10 (mg/dL)   Calcium 8.6  8.4 - 57.8 (mg/dL)   GFR calc non Af Amer 41 (*) >90 (mL/min)   GFR calc Af Amer 48 (*) >90 (mL/min)    Disposition: home today.   Follow-up  Appts: Discharge Orders    Future Orders Please Complete By Expires   Diet - low sodium heart healthy      Increase activity slowly         Follow-up Information    Follow up with Iona Hansen, NP .         Tests Needing Follow-up: BMP in 1 week.  Time spent in discharge (includes decision making & examination of pt): 25 minutes  Signed: Scottie Stanish 12/08/2011, 1:46 PM

## 2012-01-22 ENCOUNTER — Encounter (HOSPITAL_COMMUNITY): Payer: Self-pay | Admitting: Cardiology

## 2012-01-22 ENCOUNTER — Emergency Department (HOSPITAL_COMMUNITY)
Admission: EM | Admit: 2012-01-22 | Discharge: 2012-01-22 | Disposition: A | Discharge status: Home / Self Care | Payer: Medicaid Other | Attending: Emergency Medicine | Admitting: Emergency Medicine

## 2012-01-22 DIAGNOSIS — J449 Chronic obstructive pulmonary disease, unspecified: Secondary | ICD-10-CM | POA: Insufficient documentation

## 2012-01-22 DIAGNOSIS — J4489 Other specified chronic obstructive pulmonary disease: Secondary | ICD-10-CM | POA: Insufficient documentation

## 2012-01-22 DIAGNOSIS — I10 Essential (primary) hypertension: Secondary | ICD-10-CM | POA: Insufficient documentation

## 2012-01-22 DIAGNOSIS — Z79899 Other long term (current) drug therapy: Secondary | ICD-10-CM | POA: Insufficient documentation

## 2012-01-22 DIAGNOSIS — R51 Headache: Secondary | ICD-10-CM | POA: Insufficient documentation

## 2012-01-22 DIAGNOSIS — Z96659 Presence of unspecified artificial knee joint: Secondary | ICD-10-CM | POA: Insufficient documentation

## 2012-01-22 DIAGNOSIS — Z950 Presence of cardiac pacemaker: Secondary | ICD-10-CM | POA: Insufficient documentation

## 2012-01-22 DIAGNOSIS — R5381 Other malaise: Secondary | ICD-10-CM | POA: Insufficient documentation

## 2012-01-22 DIAGNOSIS — R519 Headache, unspecified: Secondary | ICD-10-CM

## 2012-01-22 LAB — BASIC METABOLIC PANEL
BUN: 51 mg/dL — ABNORMAL HIGH (ref 6–23)
CO2: 20 mEq/L (ref 19–32)
Calcium: 9.6 mg/dL (ref 8.4–10.5)
Chloride: 107 mEq/L (ref 96–112)
Creatinine, Ser: 1.67 mg/dL — ABNORMAL HIGH (ref 0.50–1.10)
GFR calc Af Amer: 38 mL/min — ABNORMAL LOW (ref >90)
GFR calc non Af Amer: 33 mL/min — ABNORMAL LOW (ref >90)
Glucose, Bld: 118 mg/dL — ABNORMAL HIGH (ref 70–99)
Potassium: 5.1 mEq/L (ref 3.5–5.1)
Sodium: 137 mEq/L (ref 135–145)

## 2012-01-22 LAB — CBC
HCT: 37.2 % (ref 36.0–46.0)
Hemoglobin: 12.2 g/dL (ref 12.0–15.0)
MCH: 26.3 pg (ref 26.0–34.0)
MCHC: 32.8 g/dL (ref 30.0–36.0)
MCV: 80.3 fL (ref 78.0–100.0)
Platelets: 273 10*3/uL (ref 150–400)
RBC: 4.63 MIL/uL (ref 3.87–5.11)
RDW: 15.2 % (ref 11.5–15.5)
WBC: 11.4 10*3/uL — ABNORMAL HIGH (ref 4.0–10.5)

## 2012-01-22 LAB — TROPONIN I: Troponin I: <0.3 ng/mL (ref <0.30)

## 2012-01-22 MED ORDER — TRAMADOL HCL 50 MG PO TABS: 50.0000 mg | ORAL_TABLET | Freq: Four times a day (QID) | ORAL | Status: AC | PRN

## 2012-01-22 MED ORDER — MORPHINE SULFATE 4 MG/ML IJ SOLN
4.0000 mg | Freq: Once | INTRAMUSCULAR | Status: AC
  Administered 2012-01-22: 4 mg via INTRAVENOUS
  Filled 2012-01-22: qty 1

## 2012-01-22 NOTE — ED Notes (Signed)
Pt to department via EMS for left sided weakness that started yesterday morning. Reports that she is also having cramping in the left side of her face and left arm weakness. No facial droop noted. Bp- palp 80 Hr- 83

## 2012-01-22 NOTE — ED Provider Notes (Signed)
History    60yF with L facial pain. Started as pain in L side of tongue yesterday morning. "I just thought it was just from too much citrus and didn't pay it no mind." Then occasional sharp shooting pain to L ear, L neck and L shoulder. Resolved then same symptoms again this morning with L arm and L leg pain. Denies weakness as noted in triage note. No acute visual complaints. Feels like tongue swollen. No difficulty with breathing or swallowing. No throat pain. No fever or chills. No rash.  CSN: [id number]  Arrival date & time 01/22/12  1441   First MD Initiated Contact with Patient 01/22/12 1501      Chief Complaint  Patient presents with  . Weakness    (Consider location/radiation/quality/duration/timing/severity/associated sxs/prior treatment) HPI  Past Medical History  Diagnosis Date  . CHF (congestive heart failure)   . Hypertension   . COPD (chronic obstructive pulmonary disease)   . Angina   . Dysrhythmia 09/12/11    ventricular paced  . Shortness of breath on exertion   . Chronic lower back pain   . Headache   . Dizziness   . Gout   . Hx: UTI (urinary tract infection)   . Cardiac defibrillator in place   . Pacemaker   . Bronchitis, chronic   . Refusal of blood transfusions as patient is Jehovah's Witness   . Arthritis     hx of gout    Past Surgical History  Procedure Date  . Replacement total knee 1990's    left  . Joint replacement   . Tubal ligation   . Insert / replace / remove pacemaker 05/2007    w/defibrillator    History reviewed. No pertinent family history.  History  Substance Use Topics  . Smoking status: Former Smoker -- 0.5 packs/day for 15 years    Types: Cigarettes  . Smokeless tobacco: Never Used   Comment: quit smoking cigarettes ~ 2007  . Alcohol Use: Yes     09/12/11 "rarely drink; not even once a month"    OB History    Grav Para Term Preterm Abortions TAB SAB Ect Mult Living                  Review of Systems   Review  of symptoms negative unless otherwise noted in HPI.   Allergies  Review of patient's allergies indicates no known allergies.  Home Medications   Current Outpatient Rx  Name Route Sig Dispense Refill  . ALLOPURINOL 100 MG PO TABS Oral Take 100 mg by mouth every morning.     . ASPIRIN EC 81 MG PO TBEC Oral Take 81 mg by mouth every morning.     . FUROSEMIDE 20 MG PO TABS Oral Take 20 mg by mouth every morning. MAY TAKE 1 ADDITIONAL TAB IF NEEDED FOR FLUID RETENTION    . LEVALBUTEROL TARTRATE 45 MCG/ACT IN AERO Inhalation Inhale 1-2 puffs into the lungs every 4 (four) hours as needed. For shortness of breath    . LEVALBUTEROL HCL 1.25 MG/3ML IN NEBU Nebulization Take 1.25 mg by nebulization every 4 (four) hours as needed. For shortness of breath    . METOPROLOL SUCCINATE ER 200 MG PO TB24 Oral Take 200 mg by mouth every morning.    Marland Kitchen OMEPRAZOLE 20 MG PO CPDR Oral Take 20 mg by mouth 2 (two) times daily.    Marland Kitchen SPIRONOLACTONE 50 MG PO TABS Oral Take 50 mg by mouth every morning.    Marland Kitchen  TIOTROPIUM BROMIDE MONOHYDRATE 18 MCG IN CAPS Inhalation Place 18 mcg into inhaler and inhale daily.    Marland Kitchen VALSARTAN 160 MG PO TABS Oral Take 160 mg by mouth every morning.      BP 111/75  Temp(Src) 98.3 F (36.8 C) (Oral)  Resp 20  SpO2 99%  Physical Exam  Nursing note and vitals reviewed. Constitutional: She is oriented to person, place, and time. She appears well-developed and well-nourished. No distress.       Laying in bed. NAD. Obese.  HENT:  Head: Normocephalic and atraumatic.  Right Ear: External ear normal.  Left Ear: External ear normal.  Mouth/Throat: Oropharynx is clear and moist.       Oropharynx clear. No tongue or oral lesions noted. No tongue swelling appreciated. Submental tissues soft. face symmetric. No concerning skin lesions. Neck supple. No adenopathy. No stridor. TMs and ext aud canals clear b/l.   Eyes: Conjunctivae are normal. Right eye exhibits no discharge. Left eye exhibits no  discharge.  Neck: Normal range of motion. Neck supple.  Cardiovascular: Normal rate, regular rhythm and normal heart sounds.  Exam reveals no gallop and no friction rub.   No murmur heard. Pulmonary/Chest: Effort normal. No stridor. No respiratory distress.       Decreased breath sounds b/l  Abdominal: Soft. She exhibits no distension. There is no tenderness.  Musculoskeletal: She exhibits no edema and no tenderness.       Well healed L anterior knee incision.  Lymphadenopathy:    She has no cervical adenopathy.  Neurological: She is alert and oriented to person, place, and time. No cranial nerve deficit. She exhibits normal muscle tone. Coordination normal.       Speech clear and content appropriate. Strength 5/5 b/l u/l extremities. No pronator drift. Good finger to nose b/l.  Skin: Skin is warm and dry. She is not diaphoretic.  Psychiatric: She has a normal mood and affect. Her behavior is normal. Thought content normal.    ED Course  Procedures (including critical care time)  Labs Reviewed  CBC - Abnormal; Notable for the following:    WBC 11.4 (*)    All other components within normal limits  BASIC METABOLIC PANEL - Abnormal; Notable for the following:    Glucose, Bld 118 (*)    BUN 51 (*)    Creatinine, Ser 1.67 (*)    GFR calc non Af Amer 33 (*)    GFR calc Af Amer 38 (*)    All other components within normal limits  TROPONIN I   No results found.  EKG:  Rhythm:  V paced Rate: 84 Axis: Left Intervals: paced ST segments: appropriate discordance   1. Facial pain       MDM  60yF with multiple vague complaints. Consider anginal equivalent with L facial/neck pain. Pt with significant cardiac hx but symptoms atypical and w/u non suggestive. Symptoms not consistent with CVA and neuro exam nonfocal. Considered neuro imaging but deferred. Mild leukocytosis of uncertain significance. Non specific. Doubt infectious etiology. No tongue or facial swelling to suggest  angioedema. No respiratory distress. Doubt carotid/vertebral artery dissection. Trigeminal neuralgia? Etiology unclear at this time but low suspicion for emergent cause. Return precautions discussed. Outpt fu.        Raeford Razor, MD 01/22/12 [phone]

## 2012-01-22 NOTE — Discharge Instructions (Signed)

## 2012-09-25 ENCOUNTER — Emergency Department (HOSPITAL_COMMUNITY)
Admission: EM | Admit: 2012-09-25 | Discharge: 2012-09-25 | Disposition: A | Discharge status: Home / Self Care | Payer: Medicaid Other | Attending: Emergency Medicine | Admitting: Emergency Medicine

## 2012-09-25 DIAGNOSIS — Z95810 Presence of automatic (implantable) cardiac defibrillator: Secondary | ICD-10-CM | POA: Insufficient documentation

## 2012-09-25 DIAGNOSIS — Z87440 Personal history of urinary (tract) infections: Secondary | ICD-10-CM | POA: Insufficient documentation

## 2012-09-25 DIAGNOSIS — R040 Epistaxis: Secondary | ICD-10-CM | POA: Insufficient documentation

## 2012-09-25 DIAGNOSIS — Z79899 Other long term (current) drug therapy: Secondary | ICD-10-CM | POA: Insufficient documentation

## 2012-09-25 DIAGNOSIS — Z8709 Personal history of other diseases of the respiratory system: Secondary | ICD-10-CM | POA: Insufficient documentation

## 2012-09-25 DIAGNOSIS — Z8679 Personal history of other diseases of the circulatory system: Secondary | ICD-10-CM | POA: Insufficient documentation

## 2012-09-25 DIAGNOSIS — Z87891 Personal history of nicotine dependence: Secondary | ICD-10-CM | POA: Insufficient documentation

## 2012-09-25 DIAGNOSIS — I509 Heart failure, unspecified: Secondary | ICD-10-CM | POA: Insufficient documentation

## 2012-09-25 DIAGNOSIS — Z8739 Personal history of other diseases of the musculoskeletal system and connective tissue: Secondary | ICD-10-CM | POA: Insufficient documentation

## 2012-09-25 DIAGNOSIS — M109 Gout, unspecified: Secondary | ICD-10-CM | POA: Insufficient documentation

## 2012-09-25 DIAGNOSIS — I10 Essential (primary) hypertension: Secondary | ICD-10-CM | POA: Insufficient documentation

## 2012-09-25 DIAGNOSIS — J4489 Other specified chronic obstructive pulmonary disease: Secondary | ICD-10-CM | POA: Insufficient documentation

## 2012-09-25 DIAGNOSIS — J449 Chronic obstructive pulmonary disease, unspecified: Secondary | ICD-10-CM | POA: Insufficient documentation

## 2012-09-25 DIAGNOSIS — Z950 Presence of cardiac pacemaker: Secondary | ICD-10-CM | POA: Insufficient documentation

## 2012-09-25 DIAGNOSIS — Z7982 Long term (current) use of aspirin: Secondary | ICD-10-CM | POA: Insufficient documentation

## 2012-09-25 LAB — CBC
Hemoglobin: 12.5 g/dL (ref 12.0–15.0)
MCH: 26 pg (ref 26.0–34.0)
MCV: 80.2 fL (ref 78.0–100.0)
RBC: 4.81 MIL/uL (ref 3.87–5.11)
WBC: 11 10*3/uL — ABNORMAL HIGH (ref 4.0–10.5)

## 2012-09-25 MED ORDER — SODIUM CHLORIDE 0.9 % IV BOLUS (SEPSIS)
1000.0000 mL | Freq: Once | INTRAVENOUS | Status: AC
  Administered 2012-09-25: 1000 mL via INTRAVENOUS

## 2012-09-25 NOTE — ED Notes (Signed)
Pt c/o nosebleed. Initial bleeding began at 8 and was stopped with tissues. Pt sneeze around noon, blowing out clots and could not control bleeding until EMS arrived. Pt is not bleeding at this time.

## 2012-09-25 NOTE — ED Provider Notes (Signed)
Monique Kane is a 62 y.o. female presents for epitaxis.  Pt bled from 9a-12p and then sneezed, dislodging the clot.  Takes ASA 81mg  but no anticoagulation.  Bleed in anterior L nare.  Hemostasis achieved PTA.    P: Await CBC for assessment of hgb and hct.  If WNL, will d/c home.  Pt mildly tachycardic on arrival.    On re-eval: Pt HR < 100.  No recurrence of the epistaxis.  Hgb, Hct WNL.  Treatment options for recurrent epistaxis discussed.  Recommend f/u with PCP for further evaluation.  1. Medications: usual home medications 2. Treatment: rest, drink plenty of fluids, use pressure, ice and/or afrin as needed if your nose bleed returns 3. Follow Up: Please followup with your primary doctor for discussion of your diagnoses and further evaluation after today's visit  Dierdre Forth, PA-C 09/25/12 1630

## 2012-09-25 NOTE — ED Provider Notes (Signed)
History     CSN: [id number]  Arrival date & time 09/25/12  1403   First MD Initiated Contact with Patient 09/25/12 1406      Chief Complaint  Patient presents with  . Epistaxis    (Consider location/radiation/quality/duration/timing/severity/associated sxs/prior treatment) HPI  Monique Kane is a 62 y.o. female complaining of nosebleed from 17 to 12  Noon. stopped and she sneezed an it restarted. Resolved 1 hour ago. No anticoagulants, Daily 81 mg ASA. Endorses lightheaded sensation. Denies CP, SOB.   Past Medical History  Diagnosis Date  . CHF (congestive heart failure)   . Hypertension   . COPD (chronic obstructive pulmonary disease)   . Angina   . Dysrhythmia 09/12/11    ventricular paced  . Shortness of breath on exertion   . Chronic lower back pain   . Headache   . Dizziness   . Gout   . Hx: UTI (urinary tract infection)   . Cardiac defibrillator in place   . Pacemaker   . Bronchitis, chronic   . Refusal of blood transfusions as patient is Jehovah's Witness   . Arthritis     hx of gout    Past Surgical History  Procedure Date  . Replacement total knee 1990's    left  . Joint replacement   . Tubal ligation   . Insert / replace / remove pacemaker 05/2007    w/defibrillator    No family history on file.  History  Substance Use Topics  . Smoking status: Former Smoker -- 0.5 packs/day for 15 years    Types: Cigarettes  . Smokeless tobacco: Never Used     Comment: quit smoking cigarettes ~ 2007  . Alcohol Use: Yes     Comment: 09/12/11 "rarely drink; not even once a month"    OB History    Grav Para Term Preterm Abortions TAB SAB Ect Mult Living                  Review of Systems  Allergies  Review of patient's allergies indicates no known allergies.  Home Medications   Current Outpatient Rx  Name  Route  Sig  Dispense  Refill  . ALLOPURINOL 100 MG PO TABS   Oral   Take 100 mg by mouth every morning.          . ASPIRIN EC 81 MG PO  TBEC   Oral   Take 81 mg by mouth every morning.          Marland Kitchen LEVALBUTEROL TARTRATE 45 MCG/ACT IN AERO   Inhalation   Inhale 1-2 puffs into the lungs every 4 (four) hours as needed. For shortness of breath         . LEVALBUTEROL HCL 1.25 MG/3ML IN NEBU   Nebulization   Take 1.25 mg by nebulization every 4 (four) hours as needed. For shortness of breath         . METOPROLOL SUCCINATE ER 200 MG PO TB24   Oral   Take 200 mg by mouth every morning.         Marland Kitchen OMEPRAZOLE 20 MG PO CPDR   Oral   Take 20 mg by mouth 2 (two) times daily.         Marland Kitchen SPIRONOLACTONE 50 MG PO TABS   Oral   Take 50 mg by mouth every morning.         Marland Kitchen TIOTROPIUM BROMIDE MONOHYDRATE 18 MCG IN CAPS   Inhalation   Place 18 mcg  into inhaler and inhale daily.         . TORSEMIDE 20 MG PO TABS   Oral   Take 20 mg by mouth 2 (two) times daily as needed. For sweling         . VALSARTAN 160 MG PO TABS   Oral   Take 160 mg by mouth every morning.         . FUROSEMIDE 20 MG PO TABS   Oral   Take 20 mg by mouth every morning. MAY TAKE 1 ADDITIONAL TAB IF NEEDED FOR FLUID RETENTION           BP 123/85  Pulse 115  Temp 98.1 F (36.7 C) (Oral)  Resp 22  SpO2 100%  Physical Exam  Nursing note and vitals reviewed. Constitutional: She is oriented to person, place, and time. She appears well-developed and well-nourished. No distress.  HENT:  Head: Normocephalic.       Hemostasis: Small clot. to posterior segment of the left nasal septum.   Eyes: Conjunctivae normal and EOM are normal.  Cardiovascular: Regular rhythm, normal heart sounds and intact distal pulses.        Mild tachycardia at 105.   Pulmonary/Chest: Effort normal. No stridor.  Musculoskeletal: Normal range of motion.  Neurological: She is alert and oriented to person, place, and time.  Psychiatric: She has a normal mood and affect.    ED Course  Procedures (including critical care time)  Labs Reviewed  CBC - Abnormal;  Notable for the following:    WBC 11.0 (*)     All other components within normal limits   No results found.   No diagnosis found.    MDM  Mild tachycardia and lightheaded sensation with resolved nose bleed. Case signed out to PA Muthersbaugh at shift change.         Wynetta Emery, PA-C 09/25/12 1625

## 2012-09-25 NOTE — ED Provider Notes (Signed)
Medical screening examination/treatment/procedure(s) were performed by non-physician practitioner and as supervising physician I was immediately available for consultation/collaboration.   Gurpreet Mikhail L Kelcey Wickstrom, MD 09/25/12 1756 

## 2012-09-26 ENCOUNTER — Encounter (HOSPITAL_COMMUNITY): Payer: Self-pay

## 2012-09-26 ENCOUNTER — Emergency Department (HOSPITAL_COMMUNITY)
Admission: EM | Admit: 2012-09-26 | Discharge: 2012-09-26 | Disposition: A | Discharge status: Home / Self Care | Payer: Medicaid Other | Attending: Emergency Medicine | Admitting: Emergency Medicine

## 2012-09-26 DIAGNOSIS — Z7982 Long term (current) use of aspirin: Secondary | ICD-10-CM | POA: Insufficient documentation

## 2012-09-26 DIAGNOSIS — I509 Heart failure, unspecified: Secondary | ICD-10-CM | POA: Insufficient documentation

## 2012-09-26 DIAGNOSIS — J4489 Other specified chronic obstructive pulmonary disease: Secondary | ICD-10-CM | POA: Insufficient documentation

## 2012-09-26 DIAGNOSIS — I10 Essential (primary) hypertension: Secondary | ICD-10-CM | POA: Insufficient documentation

## 2012-09-26 DIAGNOSIS — Z8739 Personal history of other diseases of the musculoskeletal system and connective tissue: Secondary | ICD-10-CM | POA: Insufficient documentation

## 2012-09-26 DIAGNOSIS — R6889 Other general symptoms and signs: Secondary | ICD-10-CM | POA: Insufficient documentation

## 2012-09-26 DIAGNOSIS — Z862 Personal history of diseases of the blood and blood-forming organs and certain disorders involving the immune mechanism: Secondary | ICD-10-CM | POA: Insufficient documentation

## 2012-09-26 DIAGNOSIS — Z950 Presence of cardiac pacemaker: Secondary | ICD-10-CM | POA: Insufficient documentation

## 2012-09-26 DIAGNOSIS — J449 Chronic obstructive pulmonary disease, unspecified: Secondary | ICD-10-CM | POA: Insufficient documentation

## 2012-09-26 DIAGNOSIS — R040 Epistaxis: Secondary | ICD-10-CM | POA: Insufficient documentation

## 2012-09-26 DIAGNOSIS — Z79899 Other long term (current) drug therapy: Secondary | ICD-10-CM | POA: Insufficient documentation

## 2012-09-26 DIAGNOSIS — Z87440 Personal history of urinary (tract) infections: Secondary | ICD-10-CM | POA: Insufficient documentation

## 2012-09-26 DIAGNOSIS — Z8709 Personal history of other diseases of the respiratory system: Secondary | ICD-10-CM | POA: Insufficient documentation

## 2012-09-26 DIAGNOSIS — Z8639 Personal history of other endocrine, nutritional and metabolic disease: Secondary | ICD-10-CM | POA: Insufficient documentation

## 2012-09-26 DIAGNOSIS — Z87891 Personal history of nicotine dependence: Secondary | ICD-10-CM | POA: Insufficient documentation

## 2012-09-26 DIAGNOSIS — Z8679 Personal history of other diseases of the circulatory system: Secondary | ICD-10-CM | POA: Insufficient documentation

## 2012-09-26 DIAGNOSIS — Z95810 Presence of automatic (implantable) cardiac defibrillator: Secondary | ICD-10-CM | POA: Insufficient documentation

## 2012-09-26 MED ORDER — OXYMETAZOLINE HCL 0.05 % NA SOLN
1.0000 | Freq: Once | NASAL | Status: AC
  Administered 2012-09-26: 1 via NASAL
  Filled 2012-09-26: qty 15

## 2012-09-26 NOTE — ED Notes (Signed)
Pt demonstrated use of spray.

## 2012-09-26 NOTE — ED Provider Notes (Signed)
Medical screening examination/treatment/procedure(s) were performed by non-physician practitioner and as supervising physician I was immediately available for consultation/collaboration.   Glynn Octave, MD 09/26/12 [phone]

## 2012-09-26 NOTE — ED Notes (Signed)
Per EMS-Patient was in court today when EMS called. Patient was seen at [facility] ED for epistaxis yesterday. EMS states the nose bleed was very slight upon arrival. Patient does take Aspirin daily and O2 prn.

## 2012-09-26 NOTE — ED Provider Notes (Signed)
History     CSN: [id number]  Arrival date & time 09/26/12  1243   First MD Initiated Contact with Patient 09/26/12 1316      Chief Complaint  Patient presents with  . Epistaxis    (Consider location/radiation/quality/duration/timing/severity/associated sxs/prior treatment) HPI Comments: Patient presents today with a nosebleed.  Bleed began just prior to arrival while the patient was at court.  Patient has been applying pressure since the bleed began, which has helped with the bleeding.  Bleed began after sneezing.  Bleeding has mostly resolved at this time.  She was seen in the ED at [facility] yesterday for the same.  Yesterday bleeding resolved without intervention aside from applying pressure.  Yesterday Hemoglobin, Hematocrit, and Platelets were all WNL.  Patient is currently on 81 mg ASA, but no other anticoagulants.  Patient uses [employer] at night to help her sleep.  She feels that the oxygen makes her nose dry, which may be causing the nosebleeds.  Patient is a 62 y.o. female presenting with nosebleeds. The history is provided by the patient.  Epistaxis  This is a recurrent problem. Associated with: Patient denies trauma or nose picking. The bleeding has been from both nares. She has tried applying pressure for the symptoms. Her past medical history does not include bleeding disorder.    Past Medical History  Diagnosis Date  . CHF (congestive heart failure)   . Hypertension   . COPD (chronic obstructive pulmonary disease)   . Angina   . Dysrhythmia 09/12/11    ventricular paced  . Shortness of breath on exertion   . Chronic lower back pain   . Headache   . Dizziness   . Gout   . Hx: UTI (urinary tract infection)   . Cardiac defibrillator in place   . Pacemaker   . Bronchitis, chronic   . Refusal of blood transfusions as patient is Jehovah's Witness   . Arthritis     hx of gout    Past Surgical History  Procedure Date  . Replacement total knee 1990's    left  .  Joint replacement   . Tubal ligation   . Insert / replace / remove pacemaker 05/2007    w/defibrillator    History reviewed. No pertinent family history.  History  Substance Use Topics  . Smoking status: Former Smoker -- 0.5 packs/day for 15 years    Types: Cigarettes  . Smokeless tobacco: Never Used     Comment: quit smoking cigarettes ~ 2007  . Alcohol Use: Yes     Comment: 09/12/11 "rarely drink; not even once a month"    OB History    Grav Para Term Preterm Abortions TAB SAB Ect Mult Living                  Review of Systems  Constitutional: Negative for fever and chills.  HENT: Positive for nosebleeds.   All other systems reviewed and are negative.    Allergies  Review of patient's allergies indicates no known allergies.  Home Medications   Current Outpatient Rx  Name  Route  Sig  Dispense  Refill  . ALLOPURINOL 100 MG PO TABS   Oral   Take 100 mg by mouth every morning.          . ASPIRIN EC 81 MG PO TBEC   Oral   Take 81 mg by mouth every morning.          . FUROSEMIDE 20 MG PO TABS  Oral   Take 20 mg by mouth every morning. MAY TAKE 1 ADDITIONAL TAB IF NEEDED FOR FLUID RETENTION         . LEVALBUTEROL TARTRATE 45 MCG/ACT IN AERO   Inhalation   Inhale 1-2 puffs into the lungs every 4 (four) hours as needed. For shortness of breath         . LEVALBUTEROL HCL 1.25 MG/3ML IN NEBU   Nebulization   Take 1.25 mg by nebulization every 4 (four) hours as needed. For shortness of breath         . METOPROLOL SUCCINATE ER 200 MG PO TB24   Oral   Take 200 mg by mouth every morning.         Marland Kitchen OMEPRAZOLE 20 MG PO CPDR   Oral   Take 20 mg by mouth 2 (two) times daily.         Marland Kitchen SPIRONOLACTONE 50 MG PO TABS   Oral   Take 50 mg by mouth every morning.         Marland Kitchen TIOTROPIUM BROMIDE MONOHYDRATE 18 MCG IN CAPS   Inhalation   Place 18 mcg into inhaler and inhale daily.         . TORSEMIDE 20 MG PO TABS   Oral   Take 20 mg by mouth 2 (two)  times daily as needed. For sweling         . VALSARTAN 160 MG PO TABS   Oral   Take 160 mg by mouth every morning.           BP 107/76  Pulse 95  Temp 98.1 F (36.7 C) (Oral)  Resp 17  SpO2 94%  Physical Exam  Nursing note and vitals reviewed. Constitutional: She appears well-developed and well-nourished. No distress.  HENT:  Head: Normocephalic and atraumatic.  Nose: No nasal septal hematoma. Epistaxis is observed.  Mouth/Throat: Uvula is midline, oropharynx is clear and moist and mucous membranes are normal.       Bilateral epistaxis appears to be anterior bleed.  Source of bleeding visualized.  Neck: Normal range of motion. Neck supple.  Cardiovascular: Normal rate, regular rhythm and normal heart sounds.   Pulmonary/Chest: Effort normal and breath sounds normal.  Neurological: She is alert.  Skin: Skin is warm and dry. She is not diaphoretic.  Psychiatric: She has a normal mood and affect.    ED Course  Procedures (including critical care time)  Labs Reviewed - No data to display No results found.   No diagnosis found.  2:51 PM Reassessed patient.  Epistaxis has resolved.  MDM  Patient presenting with epistaxis that developed just prior to arrival while patient was in court.  Bleed appears to be anterior.  Patient had applied pressure to the nose, which improved the bleed.  Patient given Afrin nasal spray and the bleeding resolved.  Patient is hemodynamically stable.  Hgb, Hct, and Platelets were checked yesterday and were WNL.  Therefore, feel that patient can be discharged home.          Pascal Lux Bath, PA-C 09/26/12 [phone]

## 2012-09-27 NOTE — ED Provider Notes (Signed)
Medical screening examination/treatment/procedure(s) were performed by non-physician practitioner and as supervising physician I was immediately available for consultation/collaboration.  Doug Sou, MD 09/27/12 [phone]

## 2013-05-06 ENCOUNTER — Emergency Department (HOSPITAL_COMMUNITY): Payer: Medicaid Other

## 2013-05-06 ENCOUNTER — Emergency Department (HOSPITAL_COMMUNITY)
Admission: EM | Admit: 2013-05-06 | Discharge: 2013-05-06 | Disposition: A | Discharge status: Home / Self Care | Payer: Medicaid Other | Attending: Emergency Medicine | Admitting: Emergency Medicine

## 2013-05-06 ENCOUNTER — Encounter (HOSPITAL_COMMUNITY): Payer: Self-pay | Admitting: Emergency Medicine

## 2013-05-06 DIAGNOSIS — Z79899 Other long term (current) drug therapy: Secondary | ICD-10-CM | POA: Insufficient documentation

## 2013-05-06 DIAGNOSIS — R52 Pain, unspecified: Secondary | ICD-10-CM | POA: Insufficient documentation

## 2013-05-06 DIAGNOSIS — J441 Chronic obstructive pulmonary disease with (acute) exacerbation: Secondary | ICD-10-CM | POA: Insufficient documentation

## 2013-05-06 DIAGNOSIS — Z87440 Personal history of urinary (tract) infections: Secondary | ICD-10-CM | POA: Insufficient documentation

## 2013-05-06 DIAGNOSIS — R51 Headache: Secondary | ICD-10-CM | POA: Insufficient documentation

## 2013-05-06 DIAGNOSIS — Z7982 Long term (current) use of aspirin: Secondary | ICD-10-CM | POA: Insufficient documentation

## 2013-05-06 DIAGNOSIS — Z87891 Personal history of nicotine dependence: Secondary | ICD-10-CM | POA: Insufficient documentation

## 2013-05-06 DIAGNOSIS — I509 Heart failure, unspecified: Secondary | ICD-10-CM | POA: Insufficient documentation

## 2013-05-06 DIAGNOSIS — Z95810 Presence of automatic (implantable) cardiac defibrillator: Secondary | ICD-10-CM | POA: Insufficient documentation

## 2013-05-06 DIAGNOSIS — G8929 Other chronic pain: Secondary | ICD-10-CM | POA: Insufficient documentation

## 2013-05-06 DIAGNOSIS — M129 Arthropathy, unspecified: Secondary | ICD-10-CM | POA: Insufficient documentation

## 2013-05-06 DIAGNOSIS — Z950 Presence of cardiac pacemaker: Secondary | ICD-10-CM | POA: Insufficient documentation

## 2013-05-06 DIAGNOSIS — M109 Gout, unspecified: Secondary | ICD-10-CM | POA: Insufficient documentation

## 2013-05-06 DIAGNOSIS — I10 Essential (primary) hypertension: Secondary | ICD-10-CM | POA: Insufficient documentation

## 2013-05-06 LAB — COMPREHENSIVE METABOLIC PANEL
AST: 24 U/L (ref 0–37)
BUN: 27 mg/dL — ABNORMAL HIGH (ref 6–23)
CO2: 28 mEq/L (ref 19–32)
Calcium: 9.1 mg/dL (ref 8.4–10.5)
Creatinine, Ser: 1.55 mg/dL — ABNORMAL HIGH (ref 0.50–1.10)
GFR calc Af Amer: 41 mL/min — ABNORMAL LOW (ref >90)
GFR calc non Af Amer: 35 mL/min — ABNORMAL LOW (ref >90)
Total Bilirubin: 0.3 mg/dL (ref 0.3–1.2)

## 2013-05-06 LAB — CBC WITH DIFFERENTIAL/PLATELET
Basophils Absolute: 0 10*3/uL (ref 0.0–0.1)
Eosinophils Relative: 4 % (ref 0–5)
HCT: 40.9 % (ref 36.0–46.0)
Hemoglobin: 13.6 g/dL (ref 12.0–15.0)
Lymphocytes Relative: 29 % (ref 12–46)
MCHC: 33.3 g/dL (ref 30.0–36.0)
MCV: 76.3 fL — ABNORMAL LOW (ref 78.0–100.0)
Monocytes Absolute: 1 10*3/uL (ref 0.1–1.0)
Monocytes Relative: 9 % (ref 3–12)
RDW: 17.6 % — ABNORMAL HIGH (ref 11.5–15.5)

## 2013-05-06 LAB — LACTIC ACID, PLASMA: Lactic Acid, Venous: 1.9 mmol/L (ref 0.5–2.2)

## 2013-05-06 LAB — PRO B NATRIURETIC PEPTIDE: Pro B Natriuretic peptide (BNP): 101.7 pg/mL (ref 0–125)

## 2013-05-06 MED ORDER — VALACYCLOVIR HCL 1 G PO TABS: 1000.0000 mg | ORAL_TABLET | Freq: Three times a day (TID) | ORAL | Status: AC

## 2013-05-06 MED ORDER — HYDROCODONE-ACETAMINOPHEN 5-325 MG PO TABS: 1.0000 | ORAL_TABLET | Freq: Three times a day (TID) | ORAL | Status: DC | PRN

## 2013-05-06 MED ORDER — PREDNISONE 10 MG PO TABS: 60.0000 mg | ORAL_TABLET | Freq: Every day | ORAL | Status: AC

## 2013-05-06 MED ORDER — PREDNISONE 20 MG PO TABS: 60.0000 mg | ORAL_TABLET | ORAL | Status: DC

## 2013-05-06 MED ORDER — VALACYCLOVIR HCL 500 MG PO TABS
1000.0000 mg | ORAL_TABLET | Freq: Once | ORAL | Status: DC
  Filled 2013-05-06: qty 2

## 2013-05-06 NOTE — ED Notes (Signed)
Pt sts right sided ear pain and facial pain x 3 days; pt st some difficulty managing saliva at times; pt on home O2 at 2L for COPD and sts sats in low 90's per norm

## 2013-05-06 NOTE — ED Provider Notes (Signed)
CSN: [id number]     Arrival date & time 05/06/13  1251 History   First MD Initiated Contact with Patient 05/06/13 1617     Chief Complaint  Patient presents with  . Otalgia  . Facial Pain   (Consider location/radiation/quality/duration/timing/severity/associated sxs/prior Treatment) HPI Patient presents with concerns of left-sided facial pain, swelling, discoloration as well as generalized fatigue and a new fever. Patient does have multiple medical problems, including CHF, COPD, obesity.  However, she states that she was in her usual state of health until a few days ago.  Over that timeframe she has developed increasing pain and swelling about the left lower jaw with darkening of the skin.  There is associated difficulty handling secretions, but no significant change in dyspnea.  She is on oxygen 24/7. No clear alleviating or exacerbating factors.  No confusion / disorientation, though there is mild LH w no syncope.   Past Medical History  Diagnosis Date  . CHF (congestive heart failure)   . Hypertension   . COPD (chronic obstructive pulmonary disease)   . Angina   . Dysrhythmia 09/12/11    ventricular paced  . Shortness of breath on exertion   . Chronic lower back pain   . Headache(784.0)   . Dizziness   . Gout   . Hx: UTI (urinary tract infection)   . Cardiac defibrillator in place   . Pacemaker   . Bronchitis, chronic   . Refusal of blood transfusions as patient is Jehovah's Witness   . Arthritis     hx of gout   Past Surgical History  Procedure Laterality Date  . 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 Joint replacement    . Tubal ligation    . Insert / replace / remove pacemaker  05/2007    w/defibrillator   History reviewed. No pertinent family history. History  Substance Use Topics  . Smoking status: Former Smoker -- 0.50 packs/day for 15 years    Types: Cigarettes  . Smokeless tobacco: Never Used     Comment: quit smoking cigarettes ~ 2007  . Alcohol Use:  Yes     Comment: 09/12/11 "rarely drink; not even once a month"   OB History   Grav Para Term Preterm Abortions TAB SAB Ect Mult Living                 Review of Systems  Constitutional:       Per HPI, otherwise negative  HENT:       Per HPI, otherwise negative  Respiratory:       Per HPI, otherwise negative  Cardiovascular:       Per HPI, otherwise negative  Gastrointestinal: Negative for vomiting.  Endocrine:       Negative aside from HPI  Genitourinary:       Neg aside from HPI   Musculoskeletal:       Per HPI, otherwise negative  Skin: Positive for color change.  Neurological: Negative for syncope.    Allergies  Review of patient's allergies indicates no known allergies.  Home Medications   Current Outpatient Rx  Name  Route  Sig  Dispense  Refill  . allopurinol (ZYLOPRIM) 100 MG tablet   Oral   Take 100 mg by mouth every morning.          Marland Kitchen aspirin EC 81 MG tablet   Oral   Take 81 mg by mouth every morning.          Marland Kitchen  levalbuterol (XOPENEX HFA) 45 MCG/ACT inhaler   Inhalation   Inhale 1-2 puffs into the lungs every 4 (four) hours as needed. For shortness of breath         . metoprolol (TOPROL-XL) 200 MG 24 hr tablet   Oral   Take 200 mg by mouth every morning.         Marland Kitchen omeprazole (PRILOSEC) 20 MG capsule   Oral   Take 20 mg by mouth 2 (two) times daily.         Marland Kitchen spironolactone (ALDACTONE) 50 MG tablet   Oral   Take 50 mg by mouth every morning.         . torsemide (DEMADEX) 20 MG tablet   Oral   Take 20 mg by mouth 2 (two) times daily as needed. For sweling         . valsartan (DIOVAN) 160 MG tablet   Oral   Take 160 mg by mouth every morning.          BP 115/73  Pulse 74  Temp(Src) 98.5 F (36.9 C) (Oral)  Resp 18  SpO2 91% Physical Exam  Nursing note and vitals reviewed. Constitutional: She is oriented to person, place, and time.  Morbidly obese female  HENT:  Head: Normocephalic.    Right Ear: Tympanic  membrane, external ear and ear canal normal.  Left Ear: Tympanic membrane, external ear and ear canal normal.  No gross oral lesions, uvula is midline, nonedematous.  Neck: Tracheal tenderness present. No tracheal deviation present.  Pulmonary/Chest: No stridor. Tachypnea noted. She has decreased breath sounds.  Abdominal: Soft. She exhibits no distension.  Musculoskeletal: She exhibits edema.  Neurological: She is alert and oriented to person, place, and time. No cranial nerve deficit. She exhibits normal muscle tone. Coordination normal.  Skin: Skin is warm and dry.  Psychiatric: She has a normal mood and affect.    ED Course  Procedures (including critical care time) Labs Review Labs Reviewed  CBC WITH DIFFERENTIAL  COMPREHENSIVE METABOLIC PANEL  PRO B NATRIURETIC PEPTIDE  LACTIC ACID, PLASMA   Imaging Review No results found. Pulse oximetry 90% room air abnormal Cardiac 75 sinus rhythm normal  6:43 PM Patient's labs and CT results are largely reassuring.  7:18 PM I reviewed the laboratory results, CT results with the patient and her daughter.  The patient has no new complaints, is still hemodynamically stable, afebrile.  MDM  No diagnosis found. This patient with multiple medical problems presents with new swelling, color change, pain about the left mandible.  With this constellation, there is some suspicion for early infectious process, possibly shingles, though this would be an atypical presentation.  With pain, no neurologic dysfunction, there is low suspicion of intracranial pathology.  Patient's evaluation here is largely reassuring for the absence of acute new systemic pathology.  Patient has a previously scheduled followup appointment in 2 days.  Patient was started on steroids, antivirals, analgesics, discharged to follow up with her primary care physician as scheduled.    Gerhard Munch, MD 05/06/13 1919

## 2013-05-06 NOTE — ED Notes (Signed)
Patient transported to CT 

## 2013-06-06 ENCOUNTER — Emergency Department (HOSPITAL_COMMUNITY): Payer: Medicaid Other

## 2013-06-06 ENCOUNTER — Inpatient Hospital Stay (HOSPITAL_COMMUNITY): Payer: Medicaid Other

## 2013-06-06 ENCOUNTER — Encounter (HOSPITAL_COMMUNITY): Payer: Self-pay | Admitting: Emergency Medicine

## 2013-06-06 ENCOUNTER — Inpatient Hospital Stay (HOSPITAL_COMMUNITY)
Admission: EM | Admit: 2013-06-06 | Discharge: 2013-06-12 | DRG: 871 | Disposition: A | Discharge status: Home / Self Care | Payer: Medicaid Other | Attending: Family Medicine | Admitting: Family Medicine

## 2013-06-06 DIAGNOSIS — Z91199 Patient's noncompliance with other medical treatment and regimen due to unspecified reason: Secondary | ICD-10-CM

## 2013-06-06 DIAGNOSIS — I509 Heart failure, unspecified: Secondary | ICD-10-CM | POA: Diagnosis present

## 2013-06-06 DIAGNOSIS — I129 Hypertensive chronic kidney disease with stage 1 through stage 4 chronic kidney disease, or unspecified chronic kidney disease: Secondary | ICD-10-CM | POA: Diagnosis present

## 2013-06-06 DIAGNOSIS — R9389 Abnormal findings on diagnostic imaging of other specified body structures: Secondary | ICD-10-CM | POA: Diagnosis present

## 2013-06-06 DIAGNOSIS — E662 Morbid (severe) obesity with alveolar hypoventilation: Secondary | ICD-10-CM | POA: Diagnosis present

## 2013-06-06 DIAGNOSIS — R000 Tachycardia, unspecified: Secondary | ICD-10-CM | POA: Diagnosis present

## 2013-06-06 DIAGNOSIS — Z6841 Body Mass Index (BMI) 40.0 and over, adult: Secondary | ICD-10-CM

## 2013-06-06 DIAGNOSIS — J449 Chronic obstructive pulmonary disease, unspecified: Secondary | ICD-10-CM | POA: Diagnosis present

## 2013-06-06 DIAGNOSIS — G4733 Obstructive sleep apnea (adult) (pediatric): Secondary | ICD-10-CM | POA: Diagnosis present

## 2013-06-06 DIAGNOSIS — R5381 Other malaise: Secondary | ICD-10-CM | POA: Diagnosis present

## 2013-06-06 DIAGNOSIS — I517 Cardiomegaly: Secondary | ICD-10-CM

## 2013-06-06 DIAGNOSIS — N289 Disorder of kidney and ureter, unspecified: Secondary | ICD-10-CM | POA: Diagnosis present

## 2013-06-06 DIAGNOSIS — J441 Chronic obstructive pulmonary disease with (acute) exacerbation: Secondary | ICD-10-CM | POA: Diagnosis present

## 2013-06-06 DIAGNOSIS — I420 Dilated cardiomyopathy: Secondary | ICD-10-CM | POA: Diagnosis present

## 2013-06-06 DIAGNOSIS — A419 Sepsis, unspecified organism: Principal | ICD-10-CM | POA: Diagnosis present

## 2013-06-06 DIAGNOSIS — Y92009 Unspecified place in unspecified non-institutional (private) residence as the place of occurrence of the external cause: Secondary | ICD-10-CM

## 2013-06-06 DIAGNOSIS — N189 Chronic kidney disease, unspecified: Secondary | ICD-10-CM | POA: Diagnosis present

## 2013-06-06 DIAGNOSIS — Z9981 Dependence on supplemental oxygen: Secondary | ICD-10-CM

## 2013-06-06 DIAGNOSIS — N184 Chronic kidney disease, stage 4 (severe): Secondary | ICD-10-CM | POA: Diagnosis present

## 2013-06-06 DIAGNOSIS — N179 Acute kidney failure, unspecified: Secondary | ICD-10-CM | POA: Diagnosis present

## 2013-06-06 DIAGNOSIS — J9620 Acute and chronic respiratory failure, unspecified whether with hypoxia or hypercapnia: Secondary | ICD-10-CM

## 2013-06-06 DIAGNOSIS — I10 Essential (primary) hypertension: Secondary | ICD-10-CM | POA: Diagnosis present

## 2013-06-06 DIAGNOSIS — G9340 Encephalopathy, unspecified: Secondary | ICD-10-CM | POA: Diagnosis present

## 2013-06-06 DIAGNOSIS — Z87891 Personal history of nicotine dependence: Secondary | ICD-10-CM

## 2013-06-06 DIAGNOSIS — Z9119 Patient's noncompliance with other medical treatment and regimen: Secondary | ICD-10-CM

## 2013-06-06 DIAGNOSIS — K5900 Constipation, unspecified: Secondary | ICD-10-CM

## 2013-06-06 DIAGNOSIS — E872 Acidosis, unspecified: Secondary | ICD-10-CM | POA: Diagnosis present

## 2013-06-06 DIAGNOSIS — W19XXXA Unspecified fall, initial encounter: Secondary | ICD-10-CM | POA: Diagnosis present

## 2013-06-06 DIAGNOSIS — I428 Other cardiomyopathies: Secondary | ICD-10-CM

## 2013-06-06 DIAGNOSIS — I2510 Atherosclerotic heart disease of native coronary artery without angina pectoris: Secondary | ICD-10-CM | POA: Diagnosis present

## 2013-06-06 DIAGNOSIS — R931 Abnormal findings on diagnostic imaging of heart and coronary circulation: Secondary | ICD-10-CM

## 2013-06-06 DIAGNOSIS — I5022 Chronic systolic (congestive) heart failure: Secondary | ICD-10-CM | POA: Diagnosis present

## 2013-06-06 DIAGNOSIS — Z96659 Presence of unspecified artificial knee joint: Secondary | ICD-10-CM

## 2013-06-06 DIAGNOSIS — J189 Pneumonia, unspecified organism: Secondary | ICD-10-CM

## 2013-06-06 DIAGNOSIS — Z95810 Presence of automatic (implantable) cardiac defibrillator: Secondary | ICD-10-CM

## 2013-06-06 HISTORY — DX: Constipation, unspecified: K59.00

## 2013-06-06 LAB — CBC WITH DIFFERENTIAL/PLATELET
Basophils Absolute: 0.1 10*3/uL (ref 0.0–0.1)
Basophils Relative: 1 % (ref 0–1)
HCT: 40.5 % (ref 36.0–46.0)
Lymphocytes Relative: 36 % (ref 12–46)
MCHC: 32.6 g/dL (ref 30.0–36.0)
Neutro Abs: 5 10*3/uL (ref 1.7–7.7)
Neutrophils Relative %: 48 % (ref 43–77)
Platelets: 182 10*3/uL (ref 150–400)
RDW: 18 % — ABNORMAL HIGH (ref 11.5–15.5)
WBC: 10.6 10*3/uL — ABNORMAL HIGH (ref 4.0–10.5)

## 2013-06-06 LAB — BASIC METABOLIC PANEL
CO2: 27 mEq/L (ref 19–32)
Chloride: 102 mEq/L (ref 96–112)
Creatinine, Ser: 1.81 mg/dL — ABNORMAL HIGH (ref 0.50–1.10)
GFR calc Af Amer: 34 mL/min — ABNORMAL LOW (ref >90)
Potassium: 4.4 mEq/L (ref 3.5–5.1)
Sodium: 142 mEq/L (ref 135–145)

## 2013-06-06 LAB — CBC
Hemoglobin: 13.4 g/dL (ref 12.0–15.0)
MCH: 25.5 pg — ABNORMAL LOW (ref 26.0–34.0)
MCV: 77.5 fL — ABNORMAL LOW (ref 78.0–100.0)
Platelets: 211 10*3/uL (ref 150–400)
RBC: 5.25 MIL/uL — ABNORMAL HIGH (ref 3.87–5.11)
RDW: 17.8 % — ABNORMAL HIGH (ref 11.5–15.5)
WBC: 8.8 10*3/uL (ref 4.0–10.5)

## 2013-06-06 LAB — BLOOD GAS, ARTERIAL
Acid-Base Excess: 2 mmol/L (ref 0.0–2.0)
Bicarbonate: 25.6 mEq/L — ABNORMAL HIGH (ref 20.0–24.0)
Drawn by: 257701
Expiratory PAP: 8
FIO2: 0.4 %
O2 Content: 2 L/min
O2 Saturation: 93.7 %
Patient temperature: 98.6
pCO2 arterial: 46.9 mmHg — ABNORMAL HIGH (ref 35.0–45.0)
pCO2 arterial: 50 mmHg — ABNORMAL HIGH (ref 35.0–45.0)
pH, Arterial: 7.33 — ABNORMAL LOW (ref 7.350–7.450)
pO2, Arterial: 64.9 mmHg — ABNORMAL LOW (ref 80.0–100.0)
pO2, Arterial: 74.7 mmHg — ABNORMAL LOW (ref 80.0–100.0)

## 2013-06-06 LAB — CREATININE, SERUM
Creatinine, Ser: 1.9 mg/dL — ABNORMAL HIGH (ref 0.50–1.10)
GFR calc Af Amer: 32 mL/min — ABNORMAL LOW (ref >90)
GFR calc non Af Amer: 27 mL/min — ABNORMAL LOW (ref >90)

## 2013-06-06 LAB — HEPATIC FUNCTION PANEL
AST: 19 U/L (ref 0–37)
Alkaline Phosphatase: 90 U/L (ref 39–117)
Bilirubin, Direct: 0.1 mg/dL (ref 0.0–0.3)
Indirect Bilirubin: 0.2 mg/dL — ABNORMAL LOW (ref 0.3–0.9)
Total Bilirubin: 0.3 mg/dL (ref 0.3–1.2)
Total Protein: 7.2 g/dL (ref 6.0–8.3)

## 2013-06-06 LAB — PROCALCITONIN: Procalcitonin: <0.1 ng/mL

## 2013-06-06 LAB — RAPID URINE DRUG SCREEN, HOSP PERFORMED
Amphetamines: NOT DETECTED
Barbiturates: NOT DETECTED
Benzodiazepines: NOT DETECTED
Cocaine: NOT DETECTED
Opiates: NOT DETECTED
Tetrahydrocannabinol: NOT DETECTED

## 2013-06-06 LAB — TSH: TSH: 1.358 u[IU]/mL (ref 0.350–4.500)

## 2013-06-06 LAB — TROPONIN I: Troponin I: <0.3 ng/mL (ref <0.30)

## 2013-06-06 LAB — STREP PNEUMONIAE URINARY ANTIGEN: Strep Pneumo Urinary Antigen: NEGATIVE

## 2013-06-06 LAB — AMMONIA: Ammonia: 26 umol/L (ref 11–60)

## 2013-06-06 LAB — MAGNESIUM: Magnesium: 1.8 mg/dL (ref 1.5–2.5)

## 2013-06-06 LAB — MRSA PCR SCREENING: MRSA by PCR: NEGATIVE

## 2013-06-06 LAB — PRO B NATRIURETIC PEPTIDE: Pro B Natriuretic peptide (BNP): 212.1 pg/mL — ABNORMAL HIGH (ref 0–125)

## 2013-06-06 LAB — INFLUENZA PANEL BY PCR (TYPE A & B): H1N1 flu by pcr: NOT DETECTED

## 2013-06-06 MED ORDER — DEXTROSE 5 % IV SOLN
500.0000 mg | Freq: Once | INTRAVENOUS | Status: DC
  Filled 2013-06-06: qty 500

## 2013-06-06 MED ORDER — DEXTROSE 5 % IV SOLN
1.0000 g | Freq: Once | INTRAVENOUS | Status: DC
  Filled 2013-06-06: qty 10

## 2013-06-06 MED ORDER — METHYLPREDNISOLONE SODIUM SUCC 125 MG IJ SOLR
80.0000 mg | Freq: Three times a day (TID) | INTRAMUSCULAR | Status: DC
  Administered 2013-06-06 – 2013-06-08 (×6): 80 mg via INTRAVENOUS
  Filled 2013-06-06 (×9): qty 1.28

## 2013-06-06 MED ORDER — SODIUM CHLORIDE 0.9 % IV SOLN
INTRAVENOUS | Status: DC
  Administered 2013-06-08: 12:00:00 via INTRAVENOUS

## 2013-06-06 MED ORDER — OXYCODONE HCL 5 MG PO TABS: 5.0000 mg | ORAL_TABLET | ORAL | Status: DC | PRN

## 2013-06-06 MED ORDER — LEVALBUTEROL HCL 0.63 MG/3ML IN NEBU
0.6300 mg | INHALATION_SOLUTION | RESPIRATORY_TRACT | Status: DC | PRN
  Administered 2013-06-06 – 2013-06-08 (×2): 0.63 mg via RESPIRATORY_TRACT
  Filled 2013-06-06: qty 3

## 2013-06-06 MED ORDER — LEVALBUTEROL HCL 0.63 MG/3ML IN NEBU
0.6300 mg | INHALATION_SOLUTION | RESPIRATORY_TRACT | Status: DC
  Administered 2013-06-06 – 2013-06-09 (×15): 0.63 mg via RESPIRATORY_TRACT
  Filled 2013-06-06 (×33): qty 3

## 2013-06-06 MED ORDER — PANTOPRAZOLE SODIUM 40 MG IV SOLR
40.0000 mg | INTRAVENOUS | Status: DC
  Administered 2013-06-06 – 2013-06-08 (×3): 40 mg via INTRAVENOUS
  Filled 2013-06-06 (×5): qty 40

## 2013-06-06 MED ORDER — IPRATROPIUM BROMIDE 0.02 % IN SOLN
0.5000 mg | RESPIRATORY_TRACT | Status: DC
  Administered 2013-06-06 – 2013-06-09 (×16): 0.5 mg via RESPIRATORY_TRACT
  Filled 2013-06-06 (×16): qty 2.5

## 2013-06-06 MED ORDER — SODIUM CHLORIDE 0.9 % IJ SOLN
3.0000 mL | Freq: Two times a day (BID) | INTRAMUSCULAR | Status: DC
  Administered 2013-06-06 – 2013-06-11 (×7): 3 mL via INTRAVENOUS

## 2013-06-06 MED ORDER — DEXTROSE 5 % IV SOLN: 1.0000 g | Freq: Three times a day (TID) | INTRAVENOUS | Status: DC

## 2013-06-06 MED ORDER — DEXTROSE 5 % IV SOLN
2.0000 g | INTRAVENOUS | Status: AC
  Administered 2013-06-06: 2 g via INTRAVENOUS
  Filled 2013-06-06: qty 2

## 2013-06-06 MED ORDER — METHYLPREDNISOLONE SODIUM SUCC 125 MG IJ SOLR
125.0000 mg | Freq: Once | INTRAMUSCULAR | Status: AC
  Administered 2013-06-06: 125 mg via INTRAVENOUS
  Filled 2013-06-06: qty 2

## 2013-06-06 MED ORDER — SENNOSIDES-DOCUSATE SODIUM 8.6-50 MG PO TABS
1.0000 | ORAL_TABLET | Freq: Every day | ORAL | Status: DC
  Administered 2013-06-07 – 2013-06-09 (×3): 1 via ORAL
  Filled 2013-06-06 (×6): qty 1

## 2013-06-06 MED ORDER — IPRATROPIUM BROMIDE 0.02 % IN SOLN
0.5000 mg | RESPIRATORY_TRACT | Status: DC | PRN
  Administered 2013-06-06: 0.5 mg via RESPIRATORY_TRACT
  Filled 2013-06-06: qty 2.5

## 2013-06-06 MED ORDER — SODIUM CHLORIDE 0.9 % IJ SOLN: 3.0000 mL | INTRAMUSCULAR | Status: DC | PRN

## 2013-06-06 MED ORDER — PANTOPRAZOLE SODIUM 40 MG PO TBEC: 40.0000 mg | DELAYED_RELEASE_TABLET | Freq: Every day | ORAL | Status: DC

## 2013-06-06 MED ORDER — SODIUM CHLORIDE 0.9 % IV SOLN: 250.0000 mL | INTRAVENOUS | Status: DC | PRN

## 2013-06-06 MED ORDER — INFLUENZA VAC SPLIT QUAD 0.5 ML IM SUSP
0.5000 mL | INTRAMUSCULAR | Status: AC
  Administered 2013-06-07: 0.5 mL via INTRAMUSCULAR
  Filled 2013-06-06 (×2): qty 0.5

## 2013-06-06 MED ORDER — GUAIFENESIN ER 600 MG PO TB12
1200.0000 mg | ORAL_TABLET | Freq: Two times a day (BID) | ORAL | Status: DC
  Filled 2013-06-06 (×2): qty 2

## 2013-06-06 MED ORDER — SODIUM CHLORIDE 0.9 % IV SOLN
1750.0000 mg | INTRAVENOUS | Status: DC
  Administered 2013-06-07: 1750 mg via INTRAVENOUS
  Filled 2013-06-06 (×2): qty 1750

## 2013-06-06 MED ORDER — DEXTROSE 5 % IV SOLN
2.0000 g | INTRAVENOUS | Status: DC
  Administered 2013-06-07: 2 g via INTRAVENOUS
  Filled 2013-06-06 (×2): qty 2

## 2013-06-06 MED ORDER — DEXTROSE 5 % IV SOLN
500.0000 mg | INTRAVENOUS | Status: DC
  Administered 2013-06-06: 500 mg via INTRAVENOUS
  Filled 2013-06-06 (×2): qty 500

## 2013-06-06 MED ORDER — SODIUM CHLORIDE 0.9 % IV BOLUS (SEPSIS)
500.0000 mL | Freq: Once | INTRAVENOUS | Status: AC
  Administered 2013-06-06: 500 mL via INTRAVENOUS

## 2013-06-06 MED ORDER — IPRATROPIUM BROMIDE 0.02 % IN SOLN
0.5000 mg | Freq: Four times a day (QID) | RESPIRATORY_TRACT | Status: DC
  Administered 2013-06-06: 0.5 mg via RESPIRATORY_TRACT
  Filled 2013-06-06: qty 2.5

## 2013-06-06 MED ORDER — METOPROLOL SUCCINATE ER 100 MG PO TB24
100.0000 mg | ORAL_TABLET | Freq: Every day | ORAL | Status: DC
  Filled 2013-06-06: qty 1

## 2013-06-06 MED ORDER — LEVALBUTEROL HCL 0.63 MG/3ML IN NEBU
0.6300 mg | INHALATION_SOLUTION | Freq: Four times a day (QID) | RESPIRATORY_TRACT | Status: DC
  Administered 2013-06-06: 0.63 mg via RESPIRATORY_TRACT
  Filled 2013-06-06: qty 3

## 2013-06-06 MED ORDER — ALBUTEROL SULFATE (5 MG/ML) 0.5% IN NEBU
10.0000 mg | INHALATION_SOLUTION | Freq: Once | RESPIRATORY_TRACT | Status: AC
  Administered 2013-06-06: 10 mg via RESPIRATORY_TRACT
  Filled 2013-06-06: qty 2

## 2013-06-06 MED ORDER — HEPARIN SODIUM (PORCINE) 5000 UNIT/ML IJ SOLN
5000.0000 [IU] | Freq: Three times a day (TID) | INTRAMUSCULAR | Status: DC
  Administered 2013-06-06 – 2013-06-12 (×18): 5000 [IU] via SUBCUTANEOUS
  Filled 2013-06-06 (×21): qty 1

## 2013-06-06 MED ORDER — ALBUTEROL SULFATE (5 MG/ML) 0.5% IN NEBU: 5.0000 mg | INHALATION_SOLUTION | RESPIRATORY_TRACT | Status: DC | PRN

## 2013-06-06 MED ORDER — ONDANSETRON HCL 4 MG/2ML IJ SOLN: 4.0000 mg | Freq: Four times a day (QID) | INTRAMUSCULAR | Status: DC | PRN

## 2013-06-06 MED ORDER — PNEUMOCOCCAL VAC POLYVALENT 25 MCG/0.5ML IJ INJ
0.5000 mL | INJECTION | INTRAMUSCULAR | Status: AC
  Administered 2013-06-07: 0.5 mL via INTRAMUSCULAR
  Filled 2013-06-06 (×2): qty 0.5

## 2013-06-06 MED ORDER — POLYETHYLENE GLYCOL 3350 17 G PO PACK
17.0000 g | PACK | Freq: Two times a day (BID) | ORAL | Status: DC
  Administered 2013-06-07 – 2013-06-12 (×7): 17 g via ORAL
  Filled 2013-06-06 (×14): qty 1

## 2013-06-06 MED ORDER — IPRATROPIUM BROMIDE 0.02 % IN SOLN
0.5000 mg | Freq: Once | RESPIRATORY_TRACT | Status: AC
  Administered 2013-06-06: 0.5 mg via RESPIRATORY_TRACT
  Filled 2013-06-06: qty 2.5

## 2013-06-06 MED ORDER — METOPROLOL TARTRATE 1 MG/ML IV SOLN
5.0000 mg | Freq: Three times a day (TID) | INTRAVENOUS | Status: DC
  Administered 2013-06-06 – 2013-06-08 (×6): 5 mg via INTRAVENOUS
  Filled 2013-06-06 (×10): qty 5

## 2013-06-06 MED ORDER — VANCOMYCIN HCL 10 G IV SOLR
2000.0000 mg | INTRAVENOUS | Status: AC
  Administered 2013-06-06: 2000 mg via INTRAVENOUS
  Filled 2013-06-06: qty 2000

## 2013-06-06 MED ORDER — ACETAMINOPHEN 325 MG PO TABS: 650.0000 mg | ORAL_TABLET | ORAL | Status: DC | PRN

## 2013-06-06 NOTE — H&P (Signed)
[facility] History and Physical  Monique Kane VWU:[id number] DOB: 12-26-1950 DOA: 06/06/2013  Referring physician: Dr. Loretha Stapler PCP: Iona Hansen, NP  Specialists: Pulmonary: Dr. Ysidro Evert cardiology: Dr. Elwyn Reach  Chief Complaint: Worsening shortness of breath/wheezing  HPI: Monique Kane is a 62 y.o. female  With history of morbid obesity, history of CHF secondary to diastolic dysfunction, dilated cardiomyopathy, chronic respiratory failure/COPD on chronic home oxygen 2 L nasal cannula per minute who presents to the ED with a two-day history of worsening shortness of breath, fever, generalized weakness, chills, wheezing, nonproductive cough. Patient stated that she saw her PCP one day prior to admission as she had presented there with left ear pain and redness in the throat. Patient stated that she was examined chest x-ray was done and patient was given some penicillin. Patient stated that PCP call her the night prior to admission the chest x-ray results stating that the were some abnormalities in the bases and patient needed to present to the ED today. Patient endorses a fever 103 the night prior to admission. Patient denies any nausea, no vomiting, no abdominal pain, no diarrhea. Patient does endorse some generalized weakness, nonproductive cough, chronic constipation, and soreness in the chest after coughing. Patient endorses also fallen out of bed on the day of admission. Patient was seen in the emergency room Noted to be audibly wheezing and was on continuous nebulizers on arrival with EMS. Chest x-ray which was done was concerning for bilateral basilar pneumonia. Basic metabolic profile done had a creatinine of 1.81 otherwise was within normal limits. CBC done at a white count of 10.6 otherwise was within normal limits. EKG done showed a AV paced rhythm with a heart rate in the 130s. Patient was given some nebulizer treatments in the ED, a dose of IV steroids, a dose of IV  vancomycin the dose of IV cefepime. We were called to admit the patient for further evaluation and management.  Review of Systems: The patient denies anorexia, fever, weight loss,, vision loss, decreased hearing, hoarseness, chest pain, syncope, dyspnea on exertion, peripheral edema, balance deficits, hemoptysis, abdominal pain, melena, hematochezia, severe indigestion/heartburn, hematuria, incontinence, genital sores, muscle weakness, suspicious skin lesions, transient blindness, difficulty walking, depression, unusual weight change, abnormal bleeding, enlarged lymph nodes, angioedema, and breast masses.   Past Medical History  Diagnosis Date  . CHF (congestive heart failure)   . Hypertension   . COPD (chronic obstructive pulmonary disease)   . Angina   . Dysrhythmia 09/12/11    ventricular paced  . Shortness of breath on exertion   . Chronic lower back pain   . Headache(784.0)   . Dizziness   . Gout   . Hx: UTI (urinary tract infection)   . Cardiac defibrillator in place   . Pacemaker   . Bronchitis, chronic   . Refusal of blood transfusions as patient is Jehovah's Witness   . Arthritis     hx of gout  . Unspecified constipation/ chronic 06/06/2013   Past Surgical History  Procedure Laterality Date  . 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 Joint replacement    . Tubal ligation    . Insert / replace / remove pacemaker  05/2007    w/defibrillator   Social History:  reports that she has quit smoking. Her smoking use included Cigarettes. She has a 7.5 pack-year smoking history. She has never used smokeless tobacco. She reports that she drinks alcohol. She reports that she does not use illicit drugs.  No Known Allergies  History reviewed. No pertinent family history.  Prior to Admission medications   Medication Sig Start Date End Date Taking? Authorizing Provider  allopurinol (ZYLOPRIM) 100 MG tablet Take 100 mg by mouth every morning.    Yes Historical Provider, MD   aspirin EC 81 MG tablet Take 81 mg by mouth every morning.    Yes Historical Provider, MD  levalbuterol [facility] HFA) 45 MCG/ACT inhaler Inhale 1-2 puffs into the lungs every 4 (four) hours as needed. For shortness of breath   Yes Historical Provider, MD  omeprazole (PRILOSEC) 20 MG capsule Take 20 mg by mouth daily.    Yes Historical Provider, MD  spironolactone (ALDACTONE) 50 MG tablet Take 25-50 mg by mouth 2 (two) times daily. Takes 50mg  qAM and 25mg  qPM.   Yes Historical Provider, MD  torsemide (DEMADEX) 20 MG tablet Take 20 mg by mouth 2 (two) times daily.    Yes Historical Provider, MD  valsartan (DIOVAN) 160 MG tablet Take 160 mg by mouth every morning.   Yes Historical Provider, MD   Physical Exam: Filed Vitals:   06/06/13 1045  BP:   Pulse: 128  Temp:   Resp: 18     General:  Obese female laying on a gurney with some shortness of breath on conversation.  Eyes: Pupils equal around reactive to light and accommodation. Extraocular movements intact.  ENT: Oropharynx is clear, no lesions, no exudates.  Neck: Obese, no lymphadenopathy.  Cardiovascular: Distant heart sounds secondary to body habitus. Tachycardic. No JVD. No lower extremity edema.  Respiratory: Some coarse breath sounds in the bases. Some inspiratory and expiratory wheezing. No crackles.  Abdomen: Obese, soft, nontender, nondistended, positive bowel sounds.  Skin: No rashes or lesions.  Musculoskeletal: 4/5 BUE strength, 4/5 BLE strength  Psychiatric: Normal mood. Normal affect. Fair insight. Fair judgment.  Neurologic: Alert and onto x3. Cranial nerves II through XII are grossly intact. No focal deficits.  Labs on Admission:  Basic Metabolic Panel:  Recent Labs Lab 06/06/13 0737  NA 142  K 4.4  CL 102  CO2 27  GLUCOSE 156*  BUN 21  CREATININE 1.81*  CALCIUM 9.1   Liver Function Tests: No results found for this basename: AST, ALT, ALKPHOS, BILITOT, PROT, ALBUMIN,  in the last 168 hours No  results found for this basename: LIPASE, AMYLASE,  in the last 168 hours No results found for this basename: AMMONIA,  in the last 168 hours CBC:  Recent Labs Lab 06/06/13 0737  WBC 10.6*  NEUTROABS 5.0  HGB 13.2  HCT 40.5  MCV 77.7*  PLT 182   Cardiac Enzymes: No results found for this basename: CKTOTAL, CKMB, CKMBINDEX, TROPONINI,  in the last 168 hours  BNP (last 3 results)  Recent Labs  05/06/13 1713  PROBNP 101.7   CBG: No results found for this basename: GLUCAP,  in the last 168 hours  Radiological Exams on Admission: Dg Chest Port 1 View  06/06/2013   CLINICAL DATA:  Increasing shortness of Breath.  EXAM: PORTABLE CHEST - 1 VIEW  COMPARISON:  12/04/2011  FINDINGS: Bibasilar interstitial and airspace opacities, left greater than right, have developed since the prior study. This may reflect pneumonia, atelectasis or a combination. No evidence of pulmonary edema.  Cardiac silhouette is mildly enlarged. The mediastinum is normal in contour. No pneumothorax.  Left anterior chest wall ICD is stable in well positioned.  IMPRESSION: 1. New, left greater than right, basilar interstitial and airspace opacities. This may reflect  atelectasis, infiltrate/pneumonia or combination. 2. No other acute finding and no other change from the prior exam.   Electronically Signed   By: Amie Portland M.D.   On: 06/06/2013 07:50    EKG: Independently reviewed. AV paced rhythm heart rate 135  Assessment/Plan Principal Problem:   Acute-on-chronic respiratory failure Active Problems:   Dilated cardiomyopathy   COPD (chronic obstructive pulmonary disease)   Hypertension   Cardiac defibrillator in place   Morbid obesity   HCAP (healthcare-associated pneumonia)   Tachycardia   COPD with acute exacerbation/ Early exac   Chronic renal insufficiency   Fall at home   Unspecified constipation/ chronic   #1 acute on chronic respiratory failure Multifactorial in nature. Secondary to healthcare  pneumonia noted on chest x-ray the patient with fever and worsening shortness of breath, also secondary to probable early COPD exacerbation and obesity hypoventilation syndrome. Patient received some nebulizer treatments with some clinical improvement. Will admit the patient to the step down unit for closer monitoring. Check blood cultures x2. Check a urine Legionella antigen. Check a urine pneumococcus antigen. Check a Influenza panel PCR, check a sputum Gram stain and culture. We'll place empirically on IV steroid taper, IV vancomycin and cefepime, mucolytic, flutter valve, nebulizer treatments, oxygen. Monitor.  #2 healthcare associated pneumonia Patient states was recently admitted at a [place] [facility] approximately one month prior to admission for shortness of breath but cannot tell me her discharge diagnoses at that time. Patient presented with fevers, worsening shortness of breath. Chest x-ray consistent with pneumonia. Check a urine Legionella antigen, check a urine pneumococcus antigen, check blood cultures x2, check a sputum Gram stain and culture, check an influenza panel PCR. Placed empirically on IV vancomycin, IV cefepime, mucolytic, nebulizer treatments, flutter valve. Follow.  #3 probable early acute COPD exacerbation Likely triggered by problem #2. Will place on IV steroid taper, scheduled nebs, oxygen, Mucinex, IV antibiotics. Follow.  #4. Coronary artery disease/dilated cardiomyopathy/history of CHF Stable. Patient currently compensated. EKG showed an AV paced rhythm and a heart rate of 135. Patient denies any chest pain. Chest x-ray is negative for any pulmonary edema. Patient is clinically euvolemic. We'll try to get a list of a home medications and resume that regimen.  #5 tachycardia Likely secondary to acute illness. Patient denies any chest pain. Resume patient's home dose of Toprol-XL.  #6 chronic renal insufficiency Currently stable at baseline. Baseline ranging from  1.4-2. Follow.  #7 fall Mechanical in nature. PT/OT.  #8 chronic constipation Place patient on MiraLAX twice a day. Senokot at bedtime. Will need outpatient followup.  #9 prophylaxis PPI for GI prophylaxis. Heparin for DVT prophylaxis.  Code Status: Full Family Communication: Admitted patient and daughters at bedside. Disposition Plan: Admit to the step down unit  Time spent: 65 mins  [facility] Pager [phone]  If 7PM-7AM, please contact night-coverage [email or web address] Password [email or web address] 06/06/2013, 11:07 AM

## 2013-06-06 NOTE — Consult Note (Signed)
PULMONARY  / CRITICAL CARE MEDICINE  Name: Monique Kane MRN: [id number] DOB: Jan 19, 1951 Health care pcp - all at [facility]    ADMISSION DATE:  06/06/2013 CONSULTATION DATE:  10-10  REFERRING MD :  Janee Morn  PRIMARY SERVICE: [facility]ad  CHIEF COMPLAINT:  AMS  BRIEF PATIENT DESCRIPTION: Monique Kane 89 ils AAF with AMS and resp distress out of portion to her abg.   SIGNIFICANT EVENTS / STUDIES:  10-10 ct head>>  LINES / TUBES:   CULTURES: 10-10 uc>> 10-10 bc x 2>> 10-10 Sputum if intubated>> 06/06/13 - UDS - negative 06/06/13 - Flu Virus panel 06/06/13 - Multiplex resp virus panel >>>  Recent Results (from the past 240 hour(s))  MRSA PCR SCREENING     Status: None   Collection Time    06/06/13 11:31 AM      Result Value Range Status   MRSA by PCR NEGATIVE  NEGATIVE Final   Comment:            The GeneXpert MRSA Assay (FDA     approved for NASAL specimens     only), is one component of a     comprehensive MRSA colonization     surveillance program. It is not     intended to diagnose MRSA     infection nor to guide or     monitor treatment for     MRSA infections.     ANTIBIOTICS: 10/10 ceftriazone 10/10 06/06/13 azithro >.10/10 10-10 maxipime>> 10-10 vanco>>     HISTORY OF PRESENT ILLNESS:   62 Monique Kane AAF Monique Kane (refuses blood) on home "chronic resp failure/copd" nops O2 at 2 lm with diastolic dysfhn, dilated cardiomyopathy NOS, OSA non compliant on CPAP/BiPAP. She was admitted out of the ED after seeing her PCP 10-9 for dyspnea, fever, wekaness, chills, wheezing, cough. Left ear pain, redness in throat. Rx with PCN. Night prior to admission had fever 103F. CXR at PMD office concerning for bibasilar pna and advised to go to ER.  From ER rought to SDU am of 06/06/2013. Subsequent in ICU had suddent change in mental status to lethargy but ABG showed chronic resp acidosis only. STarted on bipap with some improvement in neuro status in few hours. PCCM consulted  06/06/2013  Note hospitazlized 1 month ago at [facility] for sob    PAST MEDICAL HISTORY :  Past Medical History  Diagnosis Date  . CHF (congestive heart failure)   . Hypertension   . COPD (chronic obstructive pulmonary disease)   . Angina   . Dysrhythmia 09/12/11    ventricular paced  . Shortness of breath on exertion   . Chronic lower back pain   . Headache(784.0)   . Dizziness   . Gout   . Hx: UTI (urinary tract infection)   . Cardiac defibrillator in place   . Pacemaker   . Bronchitis, chronic   . Refusal of blood transfusions as patient is Jehovah's Kane   . Arthritis     hx of gout  . Unspecified constipation/ chronic 06/06/2013   Past Surgical History  Procedure Laterality Date  . 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 Joint replacement    . Tubal ligation    . Insert / replace / remove pacemaker  05/2007    w/defibrillator   Prior to Admission medications   Medication Sig Start Date End Date Taking? Authorizing Provider  allopurinol (ZYLOPRIM) 100 MG tablet Take 100 mg by mouth every morning.    Yes Historical  Provider, MD  aspirin EC 81 MG tablet Take 81 mg by mouth every morning.    Yes Historical Provider, MD  levalbuterol [facility] HFA) 45 MCG/ACT inhaler Inhale 1-2 puffs into the lungs every 4 (four) hours as needed. For shortness of breath   Yes Historical Provider, MD  omeprazole (PRILOSEC) 20 MG capsule Take 20 mg by mouth daily.    Yes Historical Provider, MD  spironolactone (ALDACTONE) 50 MG tablet Take 25-50 mg by mouth 2 (two) times daily. Takes 50mg  qAM and 25mg  qPM.   Yes Historical Provider, MD  torsemide (DEMADEX) 20 MG tablet Take 20 mg by mouth 2 (two) times daily.    Yes Historical Provider, MD  valsartan (DIOVAN) 160 MG tablet Take 160 mg by mouth every morning.   Yes Historical Provider, MD   No Known Allergies  FAMILY HISTORY:  History reviewed. No pertinent family history. SOCIAL HISTORY:  reports that she has quit smoking. Her smoking  use included Cigarettes. She has a 7.5 pack-year smoking history. She has never used smokeless tobacco. She reports that she drinks alcohol. She reports that she does not use illicit drugs.  REVIEW OF SYSTEMS:  NA  SUBJECTIVE:   VITAL SIGNS: Temp:  [98 F (36.7 C)-99.5 F (37.5 C)] 99.5 F (37.5 C) (10/10 1204) Pulse Rate:  [83-128] 107 (10/10 1406) Resp:  [11-22] 22 (10/10 1406) BP: (120-131)/(77-99) 120/85 mmHg (10/10 1200) SpO2:  [84 %-97 %] 96 % (10/10 1408) FiO2 (%):  [40 %] 40 % (10/10 1406) Weight:  [136.079 kg (300 lb)-140.4 kg (309 lb 8.4 oz)] 140.4 kg (309 lb 8.4 oz) (10/10 1204) HEMODYNAMICS:   VENTILATOR SETTINGS: Vent Mode:  [-]  FiO2 (%):  [40 %] 40 % Set Rate:  [15 bmp] 15 bmp PEEP:  [5 cmH20] 5 cmH20 INTAKE / OUTPUT: Intake/Output     10/09 0701 - 10/10 0700 10/10 0701 - 10/11 0700   I.V. (mL/kg)  10 (0.1)   IV Piggyback  550   Total Intake(mL/kg)  560 (4)   Net   +560          PHYSICAL EXAMINATION: General:  Monique Kane, lethagic AAF Neuro:  AT time of MD roudns - RASS -2/-3.  HEENT:  Short neck. On bipap Cardiovascular:  HSD PPM noted Lungs:  Decreased air movement ++. Not paradoxical Abdomen: large Musculoskeletal: intact Skin: warm    PULMONARY  Recent Labs Lab 06/06/13 0747  PHART 7.381  PCO2ART 46.9*  PO2ART 64.9*  HCO3 27.2*  TCO2 24.3  O2SAT 91.5    CBC  Recent Labs Lab 06/06/13 0737 06/06/13 1230  HGB 13.2 13.4  HCT 40.5 40.7  WBC 10.6* 8.8  PLT 182 211    COAGULATION No results found for this basename: INR,  in the last 168 hours  CARDIAC  No results found for this basename: TROPONINI,  in the last 168 hours  Recent Labs Lab 06/06/13 1430  PROBNP 212.1*     CHEMISTRY  Recent Labs Lab 06/06/13 0737 06/06/13 1230  NA 142  --   K 4.4  --   CL 102  --   CO2 27  --   GLUCOSE 156*  --   BUN 21  --   CREATININE 1.81* 1.90*  CALCIUM 9.1  --   MG  --  1.8   Estimated Creatinine Clearance: 43 ml/min (by C-G  formula based on Cr of 1.9).   LIVER No results found for this basename: AST, ALT, ALKPHOS, BILITOT, PROT, ALBUMIN, INR,  in the last 168 hours   INFECTIOUS No results found for this basename: LATICACIDVEN, PROCALCITON,  in the last 168 hours   ENDOCRINE CBG (last 3)  No results found for this basename: GLUCAP,  in the last 72 hours       IMAGING x48h  Dg Chest Port 1 View  06/06/2013   CLINICAL DATA:  Increasing shortness of Breath.  EXAM: PORTABLE CHEST - 1 VIEW  COMPARISON:  12/04/2011  FINDINGS: Bibasilar interstitial and airspace opacities, left greater than right, have developed since the prior study. This may reflect pneumonia, atelectasis or a combination. No evidence of pulmonary edema.  Cardiac silhouette is mildly enlarged. The mediastinum is normal in contour. No pneumothorax.  Left anterior chest wall ICD is stable in well positioned.  IMPRESSION: 1. New, left greater than right, basilar interstitial and airspace opacities. This may reflect atelectasis, infiltrate/pneumonia or combination. 2. No other acute finding and no other change from the prior exam.   Electronically Signed   By: Amie Portland M.D.   On: 06/06/2013 07:50      LABS: Principal Problem:   Acute-on-chronic respiratory failure Active Problems:   Dilated cardiomyopathy   COPD (chronic obstructive pulmonary disease)   Hypertension   Cardiac defibrillator in place   Morbid obesity   HCAP (healthcare-associated pneumonia)   Tachycardia   COPD with acute exacerbation/ Early exac   Chronic renal insufficiency   Fall at home   Unspecified constipation/ chronic   ASSESSMENT / PLAN:  PULMONARY A:Acute on chronic hypoxic resp failure coupled with decreased alertness and morbid obesity. Suspect untreated OSA . +CPD with bronchospasm. Likely Resp Virus +/- CAP   P:   -Bipap, check repat aBG stat - Nebs - steroids  CARDIOVASCULAR A: CM, PPM,HTN. Reported cardiomyopathy  P:  -Diuresis as  tolerated -BNP - cycle enzymes -- check echo  RENAL A:  Acute renal failure (baseline 1.5) P:   500 cc fluid bolus and then 93mL/h Reassess  GASTROINTESTINAL A:  GI protection P:   PPI NPIO  HEMATOLOGIC A:  No acute issue P:  moitor  INFECTIOUS A:  Presumed HCAP v CAP. Sepsis P:   -see flow - flu pcr - multiplex virus pcr - urine leg and strep - vanc/cefepime/ - restart azithro for atypicala  ENDOCRINE   A: Check tsh (.826 4-13) P:   -check tsh  NEUROLOGIC A: Acute encephlaopathy. Probably related to hypercarbia and Sepsis syndrome  P:   CT head when able UDS  TODAY'S SUMMARY:  Monique Kane 309 lbs with decreased LOC and resp distress. May need intubation if not improved. D/w Dr Glean Salvo. No family at bedside   The patient is critically ill with multiple organ systems failure and requires high complexity decision making for assessment and support, frequent evaluation and titration of therapies, application of advanced monitoring technologies and extensive interpretation of multiple databases.   Critical Care Time devoted to patient care services described in this note is  45  Minutes.  Dr. Kalman Shan, M.D., [facility].C.P Pulmonary and Critical Care Medicine Staff Physician [facility] [place] [facility] Pager: [phone], If no answer or between  15:00h - 7:00h: call [phone]  06/06/2013 3:11 PM

## 2013-06-06 NOTE — ED Provider Notes (Signed)
CSN: [id number]     Arrival date & time 06/06/13  0704 History   First MD Initiated Contact with Patient 06/06/13 [phone]     Chief Complaint  Patient presents with  . Wheezing  . Shortness of Breath  . Fall   (Consider location/radiation/quality/duration/timing/severity/associated sxs/prior Treatment) Patient is a 62 y.o. female presenting with wheezing, shortness of breath, and fall.  Wheezing Associated symptoms: chest pain, cough and shortness of breath   Associated symptoms: no fever and no sputum production   Shortness of Breath Severity:  Severe Onset quality:  Gradual Duration: several days. Timing:  Constant Progression:  Worsening Chronicity:  Recurrent Context: URI   Context comment:  Hx of COPD Relieved by:  Nothing (no breathing treatments at home.) Worsened by:  Activity Associated symptoms: chest pain, cough and wheezing   Associated symptoms: no abdominal pain (mild, "tight"), no fever, no sputum production and no vomiting   Fall Associated symptoms include chest pain and shortness of breath. Pertinent negatives include no abdominal pain (mild, "tight").    Past Medical History  Diagnosis Date  . CHF (congestive heart failure)   . Hypertension   . COPD (chronic obstructive pulmonary disease)   . Angina   . Dysrhythmia 09/12/11    ventricular paced  . Shortness of breath on exertion   . Chronic lower back pain   . Headache(784.0)   . Dizziness   . Gout   . Hx: UTI (urinary tract infection)   . Cardiac defibrillator in place   . Pacemaker   . Bronchitis, chronic   . Refusal of blood transfusions as patient is Jehovah's Witness   . Arthritis     hx of gout   Past Surgical History  Procedure Laterality Date  . 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 Joint replacement    . Tubal ligation    . Insert / replace / remove pacemaker  05/2007    w/defibrillator   History reviewed. No pertinent family history. History  Substance Use Topics  . Smoking  status: Former Smoker -- 0.50 packs/day for 15 years    Types: Cigarettes  . Smokeless tobacco: Never Used     Comment: quit smoking cigarettes ~ 2007  . Alcohol Use: Yes     Comment: 09/12/11 "rarely drink; not even once a month"   OB History   Grav Para Term Preterm Abortions TAB SAB Ect Mult Living                 Review of Systems  Constitutional: Negative for fever.  HENT: Negative for congestion.   Respiratory: Positive for cough, shortness of breath and wheezing. Negative for sputum production.   Cardiovascular: Positive for chest pain.  Gastrointestinal: Negative for nausea, vomiting, abdominal pain (mild, "tight") and diarrhea.  All other systems reviewed and are negative.    Allergies  Review of patient's allergies indicates no known allergies.  Home Medications   Current Outpatient Rx  Name  Route  Sig  Dispense  Refill  . allopurinol (ZYLOPRIM) 100 MG tablet   Oral   Take 100 mg by mouth every morning.          Marland Kitchen aspirin EC 81 MG tablet   Oral   Take 81 mg by mouth every morning.          Marland Kitchen HYDROcodone-acetaminophen (NORCO/VICODIN) 5-325 MG per tablet   Oral   Take 1 tablet by mouth every 8 (eight) hours as needed for pain.  15 tablet   0   . levalbuterol (XOPENEX HFA) 45 MCG/ACT inhaler   Inhalation   Inhale 1-2 puffs into the lungs every 4 (four) hours as needed. For shortness of breath         . metoprolol (TOPROL-XL) 200 MG 24 hr tablet   Oral   Take 200 mg by mouth every morning.         Marland Kitchen omeprazole (PRILOSEC) 20 MG capsule   Oral   Take 20 mg by mouth 2 (two) times daily.         Marland Kitchen spironolactone (ALDACTONE) 50 MG tablet   Oral   Take 50 mg by mouth every morning.         . torsemide (DEMADEX) 20 MG tablet   Oral   Take 20 mg by mouth 2 (two) times daily as needed. For sweling         . valsartan (DIOVAN) 160 MG tablet   Oral   Take 160 mg by mouth every morning.          SpO2 92% Physical Exam  Nursing note and  vitals reviewed. Constitutional: She is oriented to person, place, and time. She appears well-developed and well-nourished. No distress.  obese  HENT:  Head: Normocephalic and atraumatic.  Mouth/Throat: Oropharynx is clear and moist.  Eyes: Conjunctivae are normal. Pupils are equal, round, and reactive to light. No scleral icterus.  Neck: Neck supple.  Cardiovascular: Normal rate, regular rhythm, normal heart sounds and intact distal pulses.   No murmur heard. Pulmonary/Chest: Effort normal. No stridor. No respiratory distress. She has wheezes (decreased air movement, biltateral wheezing). She has no rales.  Abdominal: Soft. Bowel sounds are normal. She exhibits no distension. There is no tenderness.  Musculoskeletal: Normal range of motion. She exhibits no edema.  Neurological: She is alert and oriented to person, place, and time.  Skin: Skin is warm and dry. No rash noted.  Psychiatric: She has a normal mood and affect. Her behavior is normal.    ED Course  Procedures (including critical care time) Labs Review All labs drawn in ED reviewed.  Imaging Review Dg Chest Port 1 View  06/06/2013   CLINICAL DATA:  Increasing shortness of Breath.  EXAM: PORTABLE CHEST - 1 VIEW  COMPARISON:  12/04/2011  FINDINGS: Bibasilar interstitial and airspace opacities, left greater than right, have developed since the prior study. This may reflect pneumonia, atelectasis or a combination. No evidence of pulmonary edema.  Cardiac silhouette is mildly enlarged. The mediastinum is normal in contour. No pneumothorax.  Left anterior chest wall ICD is stable in well positioned.  IMPRESSION: 1. New, left greater than right, basilar interstitial and airspace opacities. This may reflect atelectasis, infiltrate/pneumonia or combination. 2. No other acute finding and no other change from the prior exam.   Electronically Signed   By: Amie Portland M.D.   On: 06/06/2013 07:50  All radiology studies independently viewed  by me.     EKG Interpretation   None     EKG - V paced rhythm, rate 135, unchanged from prior.   MDM   1. Acute-on-chronic respiratory failure   2. HCAP (healthcare-associated pneumonia)   3. Chronic renal insufficiency   4. Tachycardia   5. Unspecified constipation   6. Dilated cardiomyopathy    Hx of COPD presenting in respiratory distress.  Receiving Neb started by EMS which she states is helping.  Does not have breathing treatments at home.    She also reports  a fall.  She states she slid out of bed this morning.  She denies injuries from this fall, denies hitting head, denies LOC.      She ultimately required repeat nebulized albuterol treatments.  Given solumedrol.  CXR concerning for pneumonia.  Treated with Vanco and Cefepime for HCAP (recent admission at [facility]) Had persistent respiratory distress and required admission to Step down unit for close monitoring further treatment.     Candyce Churn, MD 06/07/13 1450

## 2013-06-06 NOTE — Progress Notes (Signed)
Pt was awake and talking when admitted 30 min ago and was able to transfer from stretcher to bed on own; she now is very difficult to arouse even with sternal rub and has shallow snoring respirations. Dr. Janee Morn notified and here to see pt. Monique Kane, Georga Hacking, RN

## 2013-06-06 NOTE — Progress Notes (Signed)
OT Cancellation Note  Patient Details Name: Monique Kane MRN: [id number] DOB: 08/24/1951   Cancelled Treatment:    Reason Eval/Treat Not Completed: Medical issues which prohibited therapy.  Reviewed chart and noted that pt is not ready for OT at this time (respiratory distress/lethargy).  Will likely check back on Monday.  Rachel Rison 06/06/2013, 3:01 PM Marica Otter, OTR/L [phone] 06/06/2013

## 2013-06-06 NOTE — ED Notes (Signed)
Report given to Susan, rn on floor 

## 2013-06-06 NOTE — ED Notes (Signed)
Bed: ZO10 Expected date: 06/06/13 Expected time: 6:51 AM Means of arrival: Ambulance Comments: Fall, wheezing

## 2013-06-06 NOTE — Progress Notes (Signed)
*  PRELIMINARY RESULTS* Echocardiogram 2D Echocardiogram has been performed.  Monique Kane 06/06/2013, 4:07 PM

## 2013-06-06 NOTE — ED Notes (Signed)
Patient arrived via EMS due to c/o fall  Patient states that she was on her way to the ED due to SOB and wheezing when she tripped and fell Patient with hx of COPD Patient arrives with neb tx in progress Insp/exp wheezing noted bilaterally Patient with hx of frequent falls Dr. Unknown Foley at bedside

## 2013-06-06 NOTE — Progress Notes (Signed)
ANTIBIOTIC CONSULT NOTE - INITIAL  Pharmacy Consult for Vancomycin and Cefepime Indication: pneumonia  No Known Allergies  Patient Measurements: Height: 5\' 4"  (162.6 cm) Weight: 300 lb (136.079 kg) IBW/kg (Calculated) : 54.7  Vital Signs: Temp: 98 F (36.7 C) (10/10 0713) Temp src: Oral (10/10 0713) BP: 131/99 mmHg (10/10 0713) Pulse Rate: 128 (10/10 0745) Intake/Output from previous day:   Intake/Output from this shift:    Labs:  Recent Labs  06/06/13 0737  WBC 10.6*  HGB 13.2  PLT 182  CREATININE 1.81*   Estimated Creatinine Clearance: 45 ml/min (by C-G formula based on Cr of 1.81). No results found for this basename: VANCOTROUGH, VANCOPEAK, VANCORANDOM, GENTTROUGH, GENTPEAK, GENTRANDOM, TOBRATROUGH, TOBRAPEAK, TOBRARND, AMIKACINPEAK, AMIKACINTROU, AMIKACIN,  in the last 72 hours   Microbiology: No results found for this or any previous visit (from the past 720 hour(s)).  Medical History: Past Medical History  Diagnosis Date  . CHF (congestive heart failure)   . Hypertension   . COPD (chronic obstructive pulmonary disease)   . Angina   . Dysrhythmia 09/12/11    ventricular paced  . Shortness of breath on exertion   . Chronic lower back pain   . Headache(784.0)   . Dizziness   . Gout   . Hx: UTI (urinary tract infection)   . Cardiac defibrillator in place   . Pacemaker   . Bronchitis, chronic   . Refusal of blood transfusions as patient is Jehovah's Witness   . Arthritis     hx of gout    Assessment: 62 y.o. female presenting with wheezing, shortness of breath, and fell on the way to ED. CXR shows new L>R, basilar interstitial and airspace opacities. This may reflect atelectasis, infiltrate/pneumonia or combination. Vancomycin and Cefepime ordered per Pharmacy protocol.  Weight: 136kg  SCr 1.8, CrCl ~ 37 ml/min  WBC slightly elevated  Blood cultures ordered  Goal of Therapy:  Vancomycin trough level 15-20 mcg/ml Cefepime dose per renal  function  Plan:   Vancomycin 2g IV x 1, then 1750mg  IV q24h Check trough at steady state  Cefepime 2g IV q24 Follow up renal function & cultures, de-escalation of therapy  Loralee Pacas, PharmD, BCPS Pager: [phone] 06/06/2013,8:51 AM

## 2013-06-06 NOTE — Progress Notes (Signed)
Was called per nursing that patient was lethargic and minimally responsive to noxious stimuli. Came and assessed the patient patient will open eyes to noxious stimuli however will become lethargic area patient not answering questions. General: Minimally responsive to noxious stimuli. Thoracoabdominal breathing. Respiratory: inspiratory and expiratory wheezes in the anterior lung fields Cardiovascular: Tachycardic Abdomen: Soft, nontender, nondistended, positive bowel sounds. Thoracoabdominal breathing. Extremities: No clubbing cyanosis or edema  A/P #1 acute encephalopathy/lethargic Questionable etiology. CT of the head has been ordered and is pending. Repeat blood gas was done with a pH of 7.33/PCO2 of 50/PO2 of 70. Will consult with critical care medicine for further evaluation and management. Will place on the BiPAP. Follow.

## 2013-06-06 NOTE — Progress Notes (Signed)
PT Cancellation Note  Patient Details Name: Monique Kane MRN: [id number] DOB: [birth year]   Cancelled Treatment:    Reason Eval/Treat Not Completed: Patient not medically ready--noted pt lethargic and awaiting head CT. Will proceed with PT eval as appropriate.   Rasean Joos 06/06/2013, 4:45 PM Pager [phone]

## 2013-06-07 ENCOUNTER — Inpatient Hospital Stay (HOSPITAL_COMMUNITY): Payer: Medicaid Other

## 2013-06-07 DIAGNOSIS — Z95810 Presence of automatic (implantable) cardiac defibrillator: Secondary | ICD-10-CM

## 2013-06-07 DIAGNOSIS — N189 Chronic kidney disease, unspecified: Secondary | ICD-10-CM

## 2013-06-07 DIAGNOSIS — R9389 Abnormal findings on diagnostic imaging of other specified body structures: Secondary | ICD-10-CM

## 2013-06-07 DIAGNOSIS — J441 Chronic obstructive pulmonary disease with (acute) exacerbation: Secondary | ICD-10-CM

## 2013-06-07 DIAGNOSIS — R931 Abnormal findings on diagnostic imaging of heart and coronary circulation: Secondary | ICD-10-CM | POA: Diagnosis present

## 2013-06-07 DIAGNOSIS — G9340 Encephalopathy, unspecified: Secondary | ICD-10-CM

## 2013-06-07 LAB — BASIC METABOLIC PANEL
BUN: 29 mg/dL — ABNORMAL HIGH (ref 6–23)
Chloride: 105 mEq/L (ref 96–112)
GFR calc Af Amer: 34 mL/min — ABNORMAL LOW (ref >90)
GFR calc non Af Amer: 29 mL/min — ABNORMAL LOW (ref >90)
Potassium: 5.1 mEq/L (ref 3.5–5.1)
Sodium: 138 mEq/L (ref 135–145)

## 2013-06-07 LAB — LEGIONELLA ANTIGEN, URINE
Legionella Antigen, Urine: NEGATIVE
Legionella Antigen, Urine: NEGATIVE

## 2013-06-07 LAB — CBC WITH DIFFERENTIAL/PLATELET
Basophils Relative: 0 % (ref 0–1)
Eosinophils Absolute: 0 10*3/uL (ref 0.0–0.7)
HCT: 38.1 % (ref 36.0–46.0)
Hemoglobin: 12.1 g/dL (ref 12.0–15.0)
Lymphocytes Relative: 11 % — ABNORMAL LOW (ref 12–46)
MCH: 24.9 pg — ABNORMAL LOW (ref 26.0–34.0)
Monocytes Absolute: 0.3 10*3/uL (ref 0.1–1.0)
Monocytes Relative: 3 % (ref 3–12)
Neutro Abs: 9 10*3/uL — ABNORMAL HIGH (ref 1.7–7.7)
Neutrophils Relative %: 86 % — ABNORMAL HIGH (ref 43–77)
RBC: 4.86 MIL/uL (ref 3.87–5.11)
WBC: 10.5 10*3/uL (ref 4.0–10.5)

## 2013-06-07 LAB — PROCALCITONIN: Procalcitonin: <0.1 ng/mL

## 2013-06-07 LAB — MAGNESIUM: Magnesium: 1.9 mg/dL (ref 1.5–2.5)

## 2013-06-07 LAB — PHOSPHORUS: Phosphorus: 4.1 mg/dL (ref 2.3–4.6)

## 2013-06-07 LAB — PRO B NATRIURETIC PEPTIDE: Pro B Natriuretic peptide (BNP): 181.7 pg/mL — ABNORMAL HIGH (ref 0–125)

## 2013-06-07 MED ORDER — ASPIRIN EC 81 MG PO TBEC
81.0000 mg | DELAYED_RELEASE_TABLET | Freq: Every day | ORAL | Status: DC
  Administered 2013-06-07 – 2013-06-12 (×6): 81 mg via ORAL
  Filled 2013-06-07 (×6): qty 1

## 2013-06-07 MED ORDER — SIMVASTATIN 10 MG PO TABS
10.0000 mg | ORAL_TABLET | Freq: Every day | ORAL | Status: DC
  Administered 2013-06-07 – 2013-06-11 (×5): 10 mg via ORAL
  Filled 2013-06-07 (×6): qty 1

## 2013-06-07 MED ORDER — LEVOFLOXACIN IN D5W 750 MG/150ML IV SOLN
750.0000 mg | INTRAVENOUS | Status: DC
  Administered 2013-06-07: 750 mg via INTRAVENOUS
  Filled 2013-06-07 (×2): qty 150

## 2013-06-07 MED ORDER — SODIUM CHLORIDE 0.9 % IV BOLUS (SEPSIS)
500.0000 mL | Freq: Once | INTRAVENOUS | Status: AC
  Administered 2013-06-07: 500 mL via INTRAVENOUS

## 2013-06-07 MED ORDER — ALLOPURINOL 100 MG PO TABS
100.0000 mg | ORAL_TABLET | Freq: Every day | ORAL | Status: DC
  Administered 2013-06-07 – 2013-06-12 (×6): 100 mg via ORAL
  Filled 2013-06-07 (×6): qty 1

## 2013-06-07 NOTE — Progress Notes (Signed)
[facility] PROGRESS NOTE  Monique Kane YNW:[id number] DOB: [birth year] DOA: 06/06/2013 PCP: Iona Hansen, NP  Assessment/Plan: #1 acute on chronic hypoxic respiratory failure Questionable etiology. Likely multifactorial secondary to a healthcare associated pneumonia, probable obesity hypoventilation syndrome/OSA, acute COPD exacerbation and left-sided effusion noted on chest x-ray. Some clinical improvement on BiPAP. Influenza PCR panel is negative. Sputum Gram stain and cultures pending. Blood cultures are pending. Urine Legionella  Pending.urine pneumococcus antigen is negative. Respiratory viral panel is pending. Continue empiric IV vancomycin, IV azithromycin and IV cefepime. Continue IV Solu-Medrol, Lopressor, nebs, BiPAP. PCCM. is following and appreciate input and recommendations.  #2 healthcare associated pneumonia Patient states was recently admitted at [place] one month prior to admission. Chest x-ray on presentation was worrisome for pneumonia. Sputum Gram stain and cultures pending. Urine pneumococcus antigen is negative. Urine Legionella antigen is pending. Continue empiric IV vancomycin, IV cefepime, IV azithromycin, nebulizer treatments.  #3 acute encephalopathy Likely multifactorial secondary to obesity hypoventilation syndrome/OSA, healthcare associated pneumonia, acute COPD exacerbation. CT head negative. Ammonia level is normal. Patient was placed on the BiPAP with some clinical improvement. Overnight patient was off the BiPAP for 3 hours and patient became more lethargic. Patient currently on BiPAP more alert answering questions following commands. Continue empiric IV antibiotics, IV steroids, nebulizer treatments, BiPAP. PCCM.  #4 acute on chronic kidney disease Baseline creatinine about 1.5. Patient was on diuretics prior to admission. Patient was given a low bolus of IV fluids secondary to borderline blood pressure issues and she is on 75 cc per hour. Monitor  renal function. Diuretics on hold. Follow.  #5 acute COPD exacerbation Likely triggered by a healthcare associated pneumonia. Clinical improvement. No wheezing noted on exam however patient is obese. Continue IV steroid taper, empiric IV antibiotics, nebulizer treatments, oxygen, BiPAP. Pulmonary following.  #6  abnormal 2-D echo/history of coronary artery disease/dilated cardiomyopathy/history of CHF/history of PPM  Patient denies any chest pain. However patient presented with worsening shortness of breath and wheezing. Pro BNP is 181. Cardiac enzymes are negative x2. 2-D echo is however abnormal with EF of 40% and wall motion abnormalities in the basal to mid inferoseptal and anteroseptal dyskinesias. Continue beta blocker. Diuretics on hold secondary to problem #4. Will consult with cardiology for further evaluation and management.  #7 tachycardia Likely secondary to acute illness. Heart rate has improved. Patient was restarted on Toprol-XL. Continue treatment for healthcare associated pneumonia, acute COPD exacerbation. Follow.  #8 left pleural effusion Noted on chest x-ray. Pulmonary following.  #9 Fall Mechanical in nature. PT/OT.  #10 chronic constipation Continue current bowel regimen.  #11 prophylaxis PPI for GI prophylaxis. Heparin for DVT prophylaxis.   Code Status: Full Family Communication: Updated patient and daughter at bedside. Disposition Plan: Remain the step down.   Consultants:  PCCM: Dr. Marchelle Gearing 06/06/2013    Procedures:  CT head 06/06/2013  2-D echo 06/06/2013  Chest x-ray 06/06/2013  Antibiotics:  IV vancomycin 06/06/2013  IV cefepime 06/06/2013  IV azithromycin 06/06/2013  HPI/Subjective: Patient alert, following commands, answering questions appropriately. On BiPAP. Ask if  able to drink some water. Events overnight noted were patient was off the BiPAP and then got lethargic and was placed back on the BiPAP.  Objective: Filed Vitals:    06/07/13 0800  BP: 114/69  Pulse: 87  Temp:   Resp: 20    Intake/Output Summary (Last 24 hours) at 06/07/13 0847 Last data filed at 06/07/13 0700  Gross per 24 hour  Intake   2455  ml  Output    360 ml  Net   2095 ml   Filed Weights   06/06/13 0713 06/06/13 1204  Weight: 136.079 kg (300 lb) 140.4 kg (309 lb 8.4 oz)    Exam:   General:  Obese, alert, BIPAP on  Cardiovascular: Distant heart sounds. RR  Respiratory: Some coarse BS L > R, no wheezing  Abdomen: Soft/NT/ND/+BS  Musculoskeletal: No c/c/e   Data Reviewed: Basic Metabolic Panel:  Recent Labs Lab 06/06/13 0737 06/06/13 1230 06/07/13 0730  NA 142  --  138  K 4.4  --  5.1  CL 102  --  105  CO2 27  --  23  GLUCOSE 156*  --  167*  BUN 21  --  29*  CREATININE 1.81* 1.90* 1.80*  CALCIUM 9.1  --  8.7  MG  --  1.8 1.9  PHOS  --   --  4.1   Liver Function Tests:  Recent Labs Lab 06/06/13 1533  AST 19  ALT 19  ALKPHOS 90  BILITOT 0.3  PROT 7.2  ALBUMIN 3.0*   No results found for this basename: LIPASE, AMYLASE,  in the last 168 hours  Recent Labs Lab 06/06/13 1832  AMMONIA 26   CBC:  Recent Labs Lab 06/06/13 0737 06/06/13 1230  WBC 10.6* 8.8  NEUTROABS 5.0  --   HGB 13.2 13.4  HCT 40.5 40.7  MCV 77.7* 77.5*  PLT 182 211   Cardiac Enzymes:  Recent Labs Lab 06/06/13 1533 06/06/13 2258 06/07/13 0730  TROPONINI <0.30 <0.30 <0.30   BNP (last 3 results)  Recent Labs  05/06/13 1713 06/06/13 1430 06/07/13 0730  PROBNP 101.7 212.1* 181.7*   CBG: No results found for this basename: GLUCAP,  in the last 168 hours  Recent Results (from the past 240 hour(s))  MRSA PCR SCREENING     Status: None   Collection Time    06/06/13 11:31 AM      Result Value Range Status   MRSA by PCR NEGATIVE  NEGATIVE Final   Comment:            The GeneXpert MRSA Assay (FDA     approved for NASAL specimens     only), is one component of a     comprehensive MRSA colonization     surveillance  program. It is not     intended to diagnose MRSA     infection nor to guide or     monitor treatment for     MRSA infections.     Studies: Ct Head Wo Contrast  06/06/2013   CLINICAL DATA:  Acute encephalopathy. Defibrillator in place. Dilated cardiomyopathy. Hypertension.  EXAM: CT HEAD WITHOUT CONTRAST  TECHNIQUE: Contiguous axial images were obtained from the base of the skull through the vertex without intravenous contrast.  COMPARISON:  05/06/2013  FINDINGS: No intracranial hemorrhage.  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  Poor delineation of the gray white differentiation. This may be explained by artifact caused by the patient's habitus. Diffuse anoxic/hypoxic injury can sometimes give a similar appearance and therefore cannot be excluded.  No hydrocephalus.  No intracranial mass lesion noted on this unenhanced exam.  IMPRESSION: No intracranial hemorrhage.  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  Poor delineation of the gray white differentiation. This may be explained by artifact caused by the patient's habitus. Diffuse anoxic/hypoxic injury can sometimes give a similar appearance and therefore cannot be excluded.  These results were called by telephone at the time of interpretation on 06/06/2013 at 6:49 PM to Darl Pikes patient's nurse, who verbally acknowledged these results.   Electronically Signed   By: Bridgett Larsson M.D.   On: 06/06/2013 18:50   Dg Chest Port 1 View  06/07/2013   CLINICAL DATA:  Respiratory failure.  EXAM: PORTABLE CHEST - 1 VIEW  COMPARISON:  06/06/2013  FINDINGS: Bibasilar opacity, left greater than right, is stable. No new lung opacities.  The cardiac silhouette is mildly enlarged. Left anterior chest wall ICD is stable in well positioned.  IMPRESSION: Left greater than right lung base opacities are unchanged from the previous day's study, and may reflect atelectasis, infiltrate or a  combination. Associated pleural fluid is suggested on the left.  No change from the previous day's study.   Electronically Signed   By: Amie Portland M.D.   On: 06/07/2013 07:09   Dg Chest Port 1 View  06/06/2013   CLINICAL DATA:  Increasing shortness of Breath.  EXAM: PORTABLE CHEST - 1 VIEW  COMPARISON:  12/04/2011  FINDINGS: Bibasilar interstitial and airspace opacities, left greater than right, have developed since the prior study. This may reflect pneumonia, atelectasis or a combination. No evidence of pulmonary edema.  Cardiac silhouette is mildly enlarged. The mediastinum is normal in contour. No pneumothorax.  Left anterior chest wall ICD is stable in well positioned.  IMPRESSION: 1. New, left greater than right, basilar interstitial and airspace opacities. This may reflect atelectasis, infiltrate/pneumonia or combination. 2. No other acute finding and no other change from the prior exam.   Electronically Signed   By: Amie Portland M.D.   On: 06/06/2013 07:50    Scheduled Meds: . azithromycin  500 mg Intravenous Q24H  . ceFEPime (MAXIPIME) IV  2 g Intravenous Q24H  . heparin  5,000 Units Subcutaneous Q8H  . influenza vac split quadrivalent PF  0.5 mL Intramuscular Tomorrow-1000  . ipratropium  0.5 mg Nebulization Q4H  . levalbuterol  0.63 mg Nebulization Q4H  . methylPREDNISolone (SOLU-MEDROL) injection  80 mg Intravenous Q8H  . metoprolol  5 mg Intravenous Q8H  . pantoprazole (PROTONIX) IV  40 mg Intravenous Q24H  . pneumococcal 23 valent vaccine  0.5 mL Intramuscular Tomorrow-1000  . polyethylene glycol  17 g Oral BID  . senna-docusate  1 tablet Oral QHS  . sodium chloride  3 mL Intravenous Q12H  . vancomycin  1,750 mg Intravenous Q24H   Continuous Infusions: . sodium chloride 75 mL/hr (06/06/13 1600)    Principal Problem:   Acute-on-chronic respiratory failure Active Problems:   Dilated cardiomyopathy   COPD (chronic obstructive pulmonary disease)   Hypertension   Cardiac  defibrillator in place   Morbid obesity   HCAP (healthcare-associated pneumonia)   Tachycardia   COPD with acute exacerbation/ Early exac   Chronic renal insufficiency   Fall at home   Unspecified constipation/ chronic   Acute encephalopathy   Abnormal echocardiogram    Time spent: > 35 mins    [facility]  [facility] Pager [phone]. If 7PM-7AM, please contact night-coverage at [email or web address], password [facility] 06/07/2013, 8:47 AM  LOS: 1 day

## 2013-06-07 NOTE — Evaluation (Addendum)
Occupational Therapy Evaluation and Discharge Patient Details Name: Monique Kane MRN: [id number] DOB: February 18, 1951 Today's Date: 06/07/2013 Time: 7829-5621 OT Time Calculation (min): 25 min  OT Assessment / Plan / Recommendation History of present illness Worsening shortness of breath/wheezing. With history of morbid obesity, history of CHF secondary to diastolic dysfunction, dilated cardiomyopathy, chronic respiratory failure/COPD on chronic home oxygen 2 L nasal cannula per minute who presents to the ED with a two-day history of worsening shortness of breath, fever, generalized weakness, chills, wheezing, nonproductive cough.  acute on chronic hypoxic respiratory failure   Clinical Impression   This 62 yo female admitted with above presents to acute OT with all education completed. Pt is very asware of pursed lipped breathing from her cardio-pulmonary classes. Will D/C from acute OT.     OT Assessment  Patient does not need any further OT services    Follow Up Recommendations  No OT follow up       Equipment Recommendations  None recommended by OT          Precautions / Restrictions Precautions Precautions: None Restrictions Weight Bearing Restrictions: No   Pertinent Vitals/Pain Sats remained at mid to high 90's throughout session on 4 liters    ADL  Equipment Used: Rolling walker (O2 at 4 liters) Transfers/Ambulation Related to ADLs: Mod I for all with RW ADL Comments: Pt reports her aid and/or grandchildren help her prn. For tolieting transfers/hygiene/clothing she is Mod Independent     Acute Rehab OT Goals Patient Stated Goal: To get something to eat  Visit Information  Last OT Received On: 06/07/13 PT/OT Co-Evaluation/Treatment: Yes (partial) History of Present Illness: Worsening shortness of breath/wheezing. With history of morbid obesity, history of CHF secondary to diastolic dysfunction, dilated cardiomyopathy, chronic respiratory failure/COPD on chronic  home oxygen 2 L nasal cannula per minute who presents to the ED with a two-day history of worsening shortness of breath, fever, generalized weakness, chills, wheezing, nonproductive cough.  acute on chronic hypoxic respiratory failure       Prior Functioning     Home Living Family/patient expects to be discharged to:: Private residence Living Arrangements: Children;Other relatives Available Help at Discharge: Family;Available PRN/intermittently;Personal care attendant (PCA is 3 hours/day (M-F)) Type of Home: House Home Access: Stairs to enter [employer] of Steps: 4 Entrance Stairs-Rails: Left (grandchildren help her on other side) Home Layout: One level Home Equipment: Walker - 2 wheels;Walker - 4 wheels;Bedside commode Additional Comments: She has adopted all 5 grandchildren (ages 22-10) Prior Function Level of Independence: Needs assistance Gait / Transfers Assistance Needed: I/Mod I depending on how she feels ADL's / Homemaking Assistance Needed: Aide and grandchildren help her prn Communication Communication: No difficulties Dominant Hand: Right         Vision/Perception Vision - History Patient Visual Report: No change from baseline   Cognition  Cognition Arousal/Alertness: Awake/alert Behavior During Therapy: WFL for tasks assessed/performed Overall Cognitive Status: Within Functional Limits for tasks assessed    Extremity/Trunk Assessment Upper Extremity Assessment Upper Extremity Assessment: Overall WFL for tasks assessed     Mobility Bed Mobility Details for Bed Mobility Assistance: Pt up in recliner upon arrival Transfers Transfers: Stand to Sit;Sit to Stand Sit to Stand: 6: Modified independent (Device/Increase time);With upper extremity assist;With armrests;From chair/3-in-1 Stand to Sit: 6: Modified independent (Device/Increase time);With upper extremity assist;With armrests;To chair/3-in-1 Details for Transfer Assistance: VCs for safe hand  placement           End of Session OT - End  of Session Equipment Utilized During Treatment: Rolling walker Activity Tolerance: Patient tolerated treatment well Patient left: in chair;with call bell/phone within reach Nurse Communication:  (Nurse saw pt walking with [place]; also informed nursing that it would be good for pt to walk with them some more this evening.)       Evette Georges [phone] 06/07/2013, 4:22 PM

## 2013-06-07 NOTE — Progress Notes (Signed)
Patient woke up and did not want to wear her Bipap. The RN let the RT know that he put the patient on a Nasal Canula at 4 Liters. Patient is stable at this time. RT will continue to monitor.

## 2013-06-07 NOTE — Progress Notes (Signed)
ANTIBIOTIC CONSULT NOTE - INITIAL  Pharmacy Consult for Levaquin Indication: pneumonia  No Known Allergies  Patient Measurements: Height: 5\' 3"  (160 cm) Weight: 309 lb 8.4 oz (140.4 kg) IBW/kg (Calculated) : 52.4  Vital Signs: Temp: 97.7 F (36.5 C) (10/11 0800) Temp src: Oral (10/11 0800) BP: 114/69 mmHg (10/11 0800) Pulse Rate: 86 (10/11 0852) Intake/Output from previous day: 10/10 0701 - 10/11 0700 In: 2455 [I.V.:1155; IV Piggyback:1300] Out: 360 [Urine:360] Intake/Output from this shift: Total I/O In: 75 [I.V.:75] Out: -   Labs:  Recent Labs  06/06/13 0737 06/06/13 1230 06/07/13 0730  WBC 10.6* 8.8  --   HGB 13.2 13.4  --   PLT 182 211  --   CREATININE 1.81* 1.90* 1.80*   Estimated Creatinine Clearance: 45.4 ml/min (by C-G formula based on Cr of 1.8). No results found for this basename: VANCOTROUGH, Leodis Binet, VANCORANDOM, GENTTROUGH, GENTPEAK, GENTRANDOM, TOBRATROUGH, TOBRAPEAK, TOBRARND, AMIKACINPEAK, AMIKACINTROU, AMIKACIN,  in the last 72 hours   Microbiology: Recent Results (from the past 720 hour(s))  MRSA PCR SCREENING     Status: None   Collection Time    06/06/13 11:31 AM      Result Value Range Status   MRSA by PCR NEGATIVE  NEGATIVE Final   Comment:            The GeneXpert MRSA Assay (FDA     approved for NASAL specimens     only), is one component of a     comprehensive MRSA colonization     surveillance program. It is not     intended to diagnose MRSA     infection nor to guide or     monitor treatment for     MRSA infections.    Medical History: Past Medical History  Diagnosis Date  . CHF (congestive heart failure)   . Hypertension   . COPD (chronic obstructive pulmonary disease)   . Angina   . Dysrhythmia 09/12/11    ventricular paced  . Shortness of breath on exertion   . Chronic lower back pain   . Headache(784.0)   . Dizziness   . Gout   . Hx: UTI (urinary tract infection)   . Cardiac defibrillator in place   .  Pacemaker   . Bronchitis, chronic   . Refusal of blood transfusions as patient is Jehovah's Witness   . Arthritis     hx of gout  . Unspecified constipation/ chronic 06/06/2013   Anti-infectives: 10/10 >> Ceftriaxone >> 10/10 10/10 >> Azithromycin (MD) >> 10/11 10/10 >> Vancomycin >> 10/10 >> Cefepime >> 10/11 >> Levaquin >>  Assessment: 62 y.o. female presenting with wheezing, shortness of breath, and fell on the way to ED on 10/10. CXR showed new L>R, basilar interstitial and airspace opacities. This may reflect atelectasis, infiltrate/pneumonia or combination. Vancomycin and Cefepime ordered per Pharmacy protocol on 10/10, adding Levaquin today for atypical coverage.  Weight: 136kg  SCr 1.8, CrCl ~ 45 ml/min  WBC slightly elevated  Blood cultures pending, RSV panel pending  Goal of Therapy:  Levaquin dose per renal function  Plan:   Begin Levaquin 750mg  IV q48h for CrCl < 50 ml/min  Continue Cefepime and Vancomycin as ordered Follow up renal function & cultures, de-escalation of therapy  Loralee Pacas, PharmD, BCPS Pager: [phone] 06/07/2013,8:55 AM

## 2013-06-07 NOTE — Consult Note (Signed)
PULMONARY  / CRITICAL CARE MEDICINE  Name: Monique Kane MRN: [id number] DOB: 12/26/1950 Health care pcp - all at [facility]    ADMISSION DATE:  06/06/2013 CONSULTATION DATE:  10-10  REFERRING MD :  Janee Morn  PRIMARY SERVICE: [facility]  CHIEF COMPLAINT:  AMS  BRIEF PATIENT DESCRIPTION: MO 309 ils AAF with AMS and resp distress out of portion to her abg.   SIGNIFICANT EVENTS / STUDIES:  10-10 ct head>>  LINES / TUBES:   CULTURES: 10-10 uc>> 10-10 bc x 2>> 10-10 Sputum if intubated>> 06/06/13 - UDS - negative 06/06/13 - Flu Virus panel>>> 06/06/13 - Multiplex resp virus panel >>>  Recent Results (from the past 240 hour(s))  MRSA PCR SCREENING     Status: None   Collection Time    06/06/13 11:31 AM      Result Value Range Status   MRSA by PCR NEGATIVE  NEGATIVE Final   Comment:            The GeneXpert MRSA Assay (FDA     approved for NASAL specimens     only), is one component of a     comprehensive MRSA colonization     surveillance program. It is not     intended to diagnose MRSA     infection nor to guide or     monitor treatment for     MRSA infections.     ANTIBIOTICS: 10/10 ceftriazone 10/10 06/06/13 azithro >.10/10 10-10 maxipime>> 10-10 vanco>> 10/11 levofloxacin>>>  SUBJECTIVE:  Remains on BIPAP  VITAL SIGNS: Temp:  [98.7 F (37.1 C)-99.5 F (37.5 C)] 99 F (37.2 C) (10/10 2000) Pulse Rate:  [84-128] 87 (10/11 0800) Resp:  [10-22] 20 (10/11 0800) BP: (92-149)/(53-109) 114/69 mmHg (10/11 0800) SpO2:  [87 %-97 %] 93 % (10/11 0800) FiO2 (%):  [40 %] 40 % (10/11 0413) Weight:  [140.4 kg (309 lb 8.4 oz)] 140.4 kg (309 lb 8.4 oz) (10/10 1204) HEMODYNAMICS:   VENTILATOR SETTINGS: Vent Mode:  [-]  FiO2 (%):  [40 %] 40 % Set Rate:  [15 bmp-16 bmp] 16 bmp PEEP:  [5 cmH20-8 cmH20] 8 cmH20 INTAKE / OUTPUT: Intake/Output     10/10 0701 - 10/11 0700 10/11 0701 - 10/12 0700   I.V. (mL/kg) 1155 (8.2)    IV Piggyback 1300    Total Intake(mL/kg) 2455  (17.5)    Urine (mL/kg/hr) 360    Total Output 360     Net +2095            PHYSICAL EXAMINATION: General:  MO,more alert Neuro:  nonfocal exam , less lethargic HEENT:  Short neck. On bipap Cardiovascular:  HSD PPM noted Lungs:  Decreased air movement Abdomen: obese, nt, nd no r Musculoskeletal: intact Skin: warm    PULMONARY  Recent Labs Lab 06/06/13 0747 06/06/13 2152  PHART 7.381 7.330*  PCO2ART 46.9* 50.0*  PO2ART 64.9* 74.7*  HCO3 27.2* 25.6*  TCO2 24.3 23.2  O2SAT 91.5 93.7    CBC  Recent Labs Lab 06/06/13 0737 06/06/13 1230  HGB 13.2 13.4  HCT 40.5 40.7  WBC 10.6* 8.8  PLT 182 211    COAGULATION  Recent Labs Lab 06/06/13 1533  INR 1.09    CARDIAC    Recent Labs Lab 06/06/13 1533 06/06/13 2258 06/07/13 0730  TROPONINI <0.30 <0.30 <0.30    Recent Labs Lab 06/06/13 1430 06/07/13 0730  PROBNP 212.1* 181.7*     CHEMISTRY  Recent Labs Lab 06/06/13 0737 06/06/13 1230 06/07/13 0730  NA  142  --  138  K 4.4  --  5.1  CL 102  --  105  CO2 27  --  23  GLUCOSE 156*  --  167*  BUN 21  --  29*  CREATININE 1.81* 1.90* 1.80*  CALCIUM 9.1  --  8.7  MG  --  1.8 1.9  PHOS  --   --  4.1   Estimated Creatinine Clearance: 45.4 ml/min (by C-G formula based on Cr of 1.8).   LIVER  Recent Labs Lab 06/06/13 1533  AST 19  ALT 19  ALKPHOS 90  BILITOT 0.3  PROT 7.2  ALBUMIN 3.0*  INR 1.09     INFECTIOUS  Recent Labs Lab 06/06/13 1534 06/06/13 1535  LATICACIDVEN 2.1  --   PROCALCITON  --  <0.10     ENDOCRINE CBG (last 3)  No results found for this basename: GLUCAP,  in the last 72 hours   IMAGING x48h  Ct Head Wo Contrast  06/06/2013   CLINICAL DATA:  Acute encephalopathy. Defibrillator in place. Dilated cardiomyopathy. Hypertension.  EXAM: CT HEAD WITHOUT CONTRAST  TECHNIQUE: Contiguous axial images were obtained from the base of the skull through the vertex without intravenous contrast.  COMPARISON:  05/06/2013   FINDINGS: No intracranial hemorrhage.  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  Poor delineation of the gray white differentiation. This may be explained by artifact caused by the patient's habitus. Diffuse anoxic/hypoxic injury can sometimes give a similar appearance and therefore cannot be excluded.  No hydrocephalus.  No intracranial mass lesion noted on this unenhanced exam.  IMPRESSION: No intracranial hemorrhage.  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  Poor delineation of the gray white differentiation. This may be explained by artifact caused by the patient's habitus. Diffuse anoxic/hypoxic injury can sometimes give a similar appearance and therefore cannot be excluded.  These results were called by telephone at the time of interpretation on 06/06/2013 at 6:49 PM to Darl Pikes patient's nurse, who verbally acknowledged these results.   Electronically Signed   By: Bridgett Larsson M.D.   On: 06/06/2013 18:50   Dg Chest Port 1 View  06/07/2013   CLINICAL DATA:  Respiratory failure.  EXAM: PORTABLE CHEST - 1 VIEW  COMPARISON:  06/06/2013  FINDINGS: Bibasilar opacity, left greater than right, is stable. No new lung opacities.  The cardiac silhouette is mildly enlarged. Left anterior chest wall ICD is stable in well positioned.  IMPRESSION: Left greater than right lung base opacities are unchanged from the previous day's study, and may reflect atelectasis, infiltrate or a combination. Associated pleural fluid is suggested on the left.  No change from the previous day's study.   Electronically Signed   By: Amie Portland M.D.   On: 06/07/2013 07:09   Dg Chest Port 1 View  06/06/2013   CLINICAL DATA:  Increasing shortness of Breath.  EXAM: PORTABLE CHEST - 1 VIEW  COMPARISON:  12/04/2011  FINDINGS: Bibasilar interstitial and airspace opacities, left greater than right, have developed since the prior study. This may reflect  pneumonia, atelectasis or a combination. No evidence of pulmonary edema.  Cardiac silhouette is mildly enlarged. The mediastinum is normal in contour. No pneumothorax.  Left anterior chest wall ICD is stable in well positioned.  IMPRESSION: 1. New, left greater than right, basilar interstitial and airspace opacities. This may reflect atelectasis, infiltrate/pneumonia or combination. 2. No other acute finding and no other change from the prior exam.  Electronically Signed   By: Amie Portland M.D.   On: 06/06/2013 07:50      LABS: Principal Problem:   Acute-on-chronic respiratory failure Active Problems:   Dilated cardiomyopathy   COPD (chronic obstructive pulmonary disease)   Hypertension   Cardiac defibrillator in place   Morbid obesity   HCAP (healthcare-associated pneumonia)   Tachycardia   COPD with acute exacerbation/ Early exac   Chronic renal insufficiency   Fall at home   Unspecified constipation/ chronic   Acute encephalopathy   ASSESSMENT / PLAN:  PULMONARY A:Acute on chronic hypoxic resp failure coupled with decreased alertness and morbid obesity. Suspect untreated OSA . +CPD with bronchospasm. Likely Resp Virus +/- CAP Left effusion? P:   -Bipap, is needed, continue on schedule 4 hrs on 1 hr off x 24 hrs, need some interruption to avoid facial trauma -pcxr in am  - Nebs - steroids, maintain -even to 1l iter pos balance goals -likely to need lasix soon, follow crt trend -may need [place] assessment left base, follow in am   CARDIOVASCULAR A: CM, PPM,HTN. Reported cardiomyopathy Trop neg  P:  -Diuresis likely need to start soon, follow trend in am crt -tele -cards per [facility]  RENAL A:  Acute renal failure (baseline 1.5), int edema? P:   Even goals Chem in am   GASTROINTESTINAL A:  GI protection P:   PPI NPO maintain  INFECTIOUS A:  Presumed HCAP v CAP. Sepsis P:   - flu pcr - neg - multiplex virus pcr - urine leg (pending)and strep (neg) -  vanc/cefepime, continue - dc azithro, add levofloxacin more aggressive atypical coverage  ENDOCRINE   A: Check tsh (.826 4-13) P:   -check tsh - 1.3  NEUROLOGIC A: Acute encephlaopathy. Probably related to hypercarbia and Sepsis syndrome  P:   ABG reviewed Treating resp failure  TODAY'S SUMMARY: schedule NIMV required, cards to see, likley to need lasix soon   The patient is critically ill with multiple organ systems failure and requires high complexity decision making for assessment and support, frequent evaluation and titration of therapies, application of advanced monitoring technologies and extensive interpretation of multiple databases.   Ccm time 30 min   Mcarthur Rossetti. Tyson Alias, MD, FACP Pgr: [phone] [facility]

## 2013-06-07 NOTE — Progress Notes (Signed)
Pt off BIPAP and tolerating well at this time on 4 LPM [place].  RT to monitor and assess as needed.

## 2013-06-07 NOTE — Consult Note (Signed)
CARDIOLOGY CONSULT NOTE   Patient ID: Jaylin Roundy MRN: [id number], DOB/AGE: [birth year]   Admit date: 06/06/2013 Date of Consult: 06/07/2013  Primary Physician: Iona Hansen, NP Primary Cardiologist:   Pt. Profile  SOB, Abnormal echocardiogram  Problem List  Past Medical History  Diagnosis Date  . CHF (congestive heart failure)   . Hypertension   . COPD (chronic obstructive pulmonary disease)   . Angina   . Dysrhythmia 09/12/11    ventricular paced  . Shortness of breath on exertion   . Chronic lower back pain   . Headache(784.0)   . Dizziness   . Gout   . Hx: UTI (urinary tract infection)   . Cardiac defibrillator in place   . Pacemaker   . Bronchitis, chronic   . Refusal of blood transfusions as patient is Jehovah's Witness   . Arthritis     hx of gout  . Unspecified constipation/ chronic 06/06/2013    Past Surgical History  Procedure Laterality Date  . 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 Joint replacement    . Tubal ligation    . Insert / replace / remove pacemaker  05/2007    w/defibrillator     Allergies  No Known Allergies   HPI   62 year old female with h/o morbid obesity, hypertension, COPD on home O2 therapy who was admitted yesterday for worsening SOB, high fever, chills, sore throat and was found to be in respiratory failure with altered mental status requiring BiPAP. In the ER she was found wheezing and treatment for presumed COPD and pneumonia (chest x-ray shows  bilateral basilar pneumonia) with iv steroids and antibiotics.  The patient is being followed by Dr Audie Pinto at [facility]  for CHF. She was diagnosed with dilated cardiomyopathy in 2008. She has a normal cath and received BiV-ICD with improvement of LV function She doesn't remember how low was her function prior to the PM placement.  She denies any chest pain, or palpitations prior to this visit. She doesn't remember all of her meds but recall furosemide and  metoprolol. No ACEI. Her family will bring medication list today.     Inpatient Medications  . azithromycin  500 mg Intravenous Q24H  . ceFEPime (MAXIPIME) IV  2 g Intravenous Q24H  . heparin  5,000 Units Subcutaneous Q8H  . influenza vac split quadrivalent PF  0.5 mL Intramuscular Tomorrow-1000  . ipratropium  0.5 mg Nebulization Q4H  . levalbuterol  0.63 mg Nebulization Q4H  . methylPREDNISolone (SOLU-MEDROL) injection  80 mg Intravenous Q8H  . metoprolol  5 mg Intravenous Q8H  . pantoprazole (PROTONIX) IV  40 mg Intravenous Q24H  . pneumococcal 23 valent vaccine  0.5 mL Intramuscular Tomorrow-1000  . polyethylene glycol  17 g Oral BID  . senna-docusate  1 tablet Oral QHS  . sodium chloride  3 mL Intravenous Q12H  . vancomycin  1,750 mg Intravenous Q24H    Family History History reviewed. No pertinent family history.   Social History History   Social History  . Marital Status: Single    Spouse Name: N/A    Number of Children: N/A  . Years of Education: N/A   Occupational History  . Not on file.   Social History Main Topics  . Smoking status: Former Smoker -- 0.50 packs/day for 15 years    Types: Cigarettes  . Smokeless tobacco: Never Used     Comment: quit smoking cigarettes ~ 2007  .  Alcohol Use: Yes     Comment: 09/12/11 "rarely drink; not even once a month"  . Drug Use: No  . Sexual Activity: No   Other Topics Concern  . Not on file   Social History Narrative  . No narrative on file     Review of Systems  General:  No chills, fever, night sweats or weight changes.  Cardiovascular:  No chest pain, dyspnea on exertion, edema, orthopnea, palpitations, paroxysmal nocturnal dyspnea. Dermatological: No rash, lesions/masses Respiratory: No cough, dyspnea Urologic: No hematuria, dysuria Abdominal:   No nausea, vomiting, diarrhea, bright red blood per rectum, melena, or hematemesis Neurologic:  No visual changes, wkns, changes in mental status. All other  systems reviewed and are otherwise negative except as noted above.  Physical Exam  Blood pressure 114/69, pulse 87, temperature 99 F (37.2 C), temperature source Axillary, resp. rate 20, height 5\' 3"  (1.6 m), weight 309 lb 8.4 oz (140.4 kg), SpO2 93.00%.  General: Pleasant, NAD Psych: Normal affect. Neuro: Alert and oriented X 3. Moves all extremities spontaneously. HEENT: Normal  Neck: Supple without bruits or JVD. Lungs:  Resp regular and unlabored, CTA. Heart: RRR no s3, s4, or murmurs. Abdomen: Soft, non-tender, non-distended, BS + x 4.  Extremities: No clubbing, cyanosis or edema. DP/PT/Radials 2+ and equal bilaterally.  Labs   Recent Labs  06/06/13 1533 06/06/13 2258 06/07/13 0730  TROPONINI <0.30 <0.30 <0.30   Lab Results  Component Value Date   WBC 8.8 06/06/2013   HGB 13.4 06/06/2013   HCT 40.7 06/06/2013   MCV 77.5* 06/06/2013   PLT 211 06/06/2013    Recent Labs Lab 06/06/13 1533 06/07/13 0730  NA  --  138  K  --  5.1  CL  --  105  CO2  --  23  BUN  --  29*  CREATININE  --  1.80*  CALCIUM  --  8.7  PROT 7.2  --   BILITOT 0.3  --   ALKPHOS 90  --   ALT 19  --   AST 19  --   GLUCOSE  --  167*    Lab Results  Component Value Date   DDIMER 0.78* 12/04/2011    Radiology/Studies  Ct Head Wo Contrast 06/06/2013   IMPRESSION: No intracranial hemorrhage.  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  Poor delineation of the gray white differentiation. This may be explained by artifact caused by the patient's habitus. Diffuse anoxic/hypoxic injury can sometimes give a similar appearance and therefore cannot be excluded.    Dg Chest Port 1 View  06/07/2013   IMPRESSION: Left greater than right lung base opacities are unchanged from the previous day's study, and may reflect atelectasis, infiltrate or a combination. Associated pleural fluid is suggested on the left.  No change from the previous day's study.   Dg  Chest Port 1 View  06/06/2013 IMPRESSION: 1. New, left greater than right, basilar interstitial and airspace opacities. This may reflect atelectasis, infiltrate/pneumonia or combination. 2. No other acute finding and no other change from the prior exam.     TTE 06/06/2013  - Left ventricle: The cavity size was normal. Wall thickness was normal. The estimated ejection fraction was 40%. The basal to mid anteroseptal and inferoseptal walls were dyskinetic. Other wall segments moved normally. Indeterminant diastolic function. - Aortic valve: There was no stenosis. - Mitral valve: No significant regurgitation. - Left atrium: The atrium was mildly dilated. - Right ventricle: The cavity size  was mildly dilated. Pacer wire or catheter noted in right ventricle. Systolic function was normal. - Right atrium: The atrium was mildly dilated. - Pulmonary arteries: No complete TR doppler jet so unable to estimate PA systolic pressure. - Systemic veins: IVC was not well-visualized. Impressions:  - Technically difficult study with poor acoustic windows. Normal LV size, EF 40%. Unusual wall motion abnormality pattern with basal to mid inferoseptal and anteroseptal dyskinesis. Other wall segments moved normally. Normal RV size and systolic function.  ECG  A-V sequential pacing with ventricular rate 135/minute     ASSESSMENT AND PLAN  62 year old female   1. Respiratory failure secondary to B/L pneumonia and COPD exacerbation - on iv steroids and antibiotics, significantly improved overnight. Now off BiPAP, mental status improved, no active wheezing.   2. 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 Abnormal wall motion on the echo might be possibly explained by Bi pacing. Troponins are negative and the patient is asymptomatic, there is no reason to proceed with further ischemia work up at this point.  The patient is not fluid overloaded, rather  dry in the settings of sepsis, we will hold her home dose of lasix and follow to prevent fluid overload with iv meds. Hold ACEI as there is ARF, we can start metoprolol once her BP tolerates it.   3. ARF - hold lasix, gentle hydration to avoid fluid ovearload with low LV EF   Thank you for consulting [place] on thsi patient. Please call us with any questions.   Signed, Lars Masson, MD 06/07/2013 8:48 am

## 2013-06-07 NOTE — Evaluation (Signed)
Physical Therapy Evaluation Patient Details Name: Monique Kane MRN: [id number] DOB: Aug 20, 1951 Today's Date: 06/07/2013 Time: 7829-5621 PT Time Calculation (min): 17 min  PT Assessment / Plan / Recommendation History of Present Illness  Worsening shortness of breath/wheezing. With history of morbid obesity, history of CHF secondary to diastolic dysfunction, dilated cardiomyopathy, chronic respiratory failure/COPD on chronic home oxygen 2 L nasal cannula per minute who presents to the ED with a two-day history of worsening shortness of breath, fever, generalized weakness, chills, wheezing, nonproductive cough.  acute on chronic hypoxic respiratory failure  Clinical Impression  Pt is independent in transfers and gait with RW.  She has been through outpatient cardiac and pulmonary rehab previously and remembers the exercises she should do at home    PT Assessment  Patent does not need any further PT services    Follow Up Recommendations  No PT follow up    Does the patient have the potential to tolerate intense rehabilitation      Barriers to Discharge        Equipment Recommendations  None recommended by PT    Recommendations for Other Services     Frequency      Precautions / Restrictions Precautions Precautions: None Restrictions Weight Bearing Restrictions: No   Pertinent Vitals/Pain O2 sats maintained > 90% when ambulating on 4L of O2      Mobility  Bed Mobility Details for Bed Mobility Assistance: Pt up in recliner upon arrival Transfers Sit to Stand: 6: Modified independent (Device/Increase time);With upper extremity assist;With armrests;From chair/3-in-1 Stand to Sit: 6: Modified independent (Device/Increase time);With upper extremity assist;With armrests;To chair/3-in-1 Details for Transfer Assistance: VCs for safe hand placement Ambulation/Gait Ambulation/Gait Assistance: 6: Modified independent (Device/Increase time) Ambulation Distance (Feet): 150 Feet  (needed a few standing rest breaks) Assistive device: Rolling walker (wide) Ambulation/Gait Assistance Details: nonoe Gait Pattern: Within Functional Limits Gait velocity: wfl General Gait Details: pt is comfortable with using RW as she has used in the past when she has had a flare up of gout Stairs: No Wheelchair Mobility Wheelchair Mobility: No    Exercises Other Exercises Other Exercises: reviewed recommendation for short , frequent bouts of acitivity to increase activity tolerance   PT Diagnosis:    PT Problem List:   PT Treatment Interventions:       PT Goals(Current goals can be found in the care plan section) Acute Rehab PT Goals Patient Stated Goal: to return home PT Goal Formulation: No goals set, d/c therapy  Visit Information  Last PT Received On: 06/07/13 Assistance Needed: +1 History of Present Illness: Worsening shortness of breath/wheezing. With history of morbid obesity, history of CHF secondary to diastolic dysfunction, dilated cardiomyopathy, chronic respiratory failure/COPD on chronic home oxygen 2 L nasal cannula per minute who presents to the ED with a two-day history of worsening shortness of breath, fever, generalized weakness, chills, wheezing, nonproductive cough.  acute on chronic hypoxic respiratory failure       Prior Functioning  Home Living Family/patient expects to be discharged to:: Private residence Living Arrangements: Children;Other relatives Available Help at Discharge: Family;Available PRN/intermittently;Personal care attendant (PCA is 3 hours/day (M-F)) Type of Home: House Home Access: Stairs to enter [employer] of Steps: 4 Entrance Stairs-Rails: Left (grandchildren help her on other side) Home Layout: One level Home Equipment: Walker - 2 wheels;Walker - 4 wheels;Bedside commode Additional Comments: She has adopted all 5 grandchildren (ages 9-10) Prior Function Level of Independence: Needs assistance Gait / Transfers  Assistance Needed: I/Mod I depending  on how she feels ADL's / Homemaking Assistance Needed: Aide and grandchildren help her prn Communication Communication: No difficulties Dominant Hand: Right    Cognition  Cognition Arousal/Alertness: Awake/alert Behavior During Therapy: WFL for tasks assessed/performed Overall Cognitive Status: Within Functional Limits for tasks assessed    Extremity/Trunk Assessment Upper Extremity Assessment Upper Extremity Assessment: Defer to OT evaluation Lower Extremity Assessment Lower Extremity Assessment: Overall WFL for tasks assessed Cervical / Trunk Assessment Cervical / Trunk Assessment: Other exceptions Cervical / Trunk Exceptions: pt with obese abdomen   Balance Balance Balance Assessed: Yes Static Sitting Balance Static Sitting - Balance Support: No upper extremity supported;Feet supported Static Sitting - Level of Assistance: 7: Independent Static Standing Balance Static Standing - Balance Support: No upper extremity supported;During functional activity Static Standing - Level of Assistance: 7: Independent  End of Session PT - End of Session Equipment Utilized During Treatment: Oxygen Activity Tolerance: Patient tolerated treatment well Patient left: in chair  GP    Monique Kane, [place] [phone] 06/07/2013, 4:49 PM

## 2013-06-07 NOTE — Progress Notes (Signed)
RT called to consult MD about going back on BIPAP at this time due to waiting to hear back about diet. Pt is in no distress, walked to nurses station and back with no distress, Spo2 remains WNL on 4L [place]. RR stable. MD stated ok to wait to place pt back on BIPAP.

## 2013-06-08 ENCOUNTER — Inpatient Hospital Stay (HOSPITAL_COMMUNITY): Payer: Medicaid Other

## 2013-06-08 DIAGNOSIS — N179 Acute kidney failure, unspecified: Secondary | ICD-10-CM

## 2013-06-08 DIAGNOSIS — R000 Tachycardia, unspecified: Secondary | ICD-10-CM

## 2013-06-08 LAB — RESPIRATORY VIRUS PANEL
Adenovirus: NOT DETECTED
Influenza A H1: NOT DETECTED
Influenza A H3: NOT DETECTED
Influenza A: NOT DETECTED
Metapneumovirus: NOT DETECTED
Parainfluenza 2: NOT DETECTED
Parainfluenza 3: NOT DETECTED
Respiratory Syncytial Virus B: NOT DETECTED
Rhinovirus: NOT DETECTED

## 2013-06-08 LAB — BASIC METABOLIC PANEL
BUN: 44 mg/dL — ABNORMAL HIGH (ref 6–23)
Calcium: 9.1 mg/dL (ref 8.4–10.5)
Creatinine, Ser: 2.03 mg/dL — ABNORMAL HIGH (ref 0.50–1.10)
GFR calc Af Amer: 29 mL/min — ABNORMAL LOW (ref >90)
GFR calc non Af Amer: 25 mL/min — ABNORMAL LOW (ref >90)
Glucose, Bld: 184 mg/dL — ABNORMAL HIGH (ref 70–99)
Sodium: 138 mEq/L (ref 135–145)

## 2013-06-08 LAB — CBC
MCH: 26.2 pg (ref 26.0–34.0)
MCHC: 33.8 g/dL (ref 30.0–36.0)
Platelets: 191 10*3/uL (ref 150–400)

## 2013-06-08 LAB — GLUCOSE, CAPILLARY
Glucose-Capillary: 162 mg/dL — ABNORMAL HIGH (ref 70–99)
Glucose-Capillary: 125 mg/dL — ABNORMAL HIGH (ref 70–99)

## 2013-06-08 LAB — SODIUM, URINE, RANDOM: Sodium, Ur: 10 mEq/L

## 2013-06-08 MED ORDER — METHYLPREDNISOLONE SODIUM SUCC 40 MG IJ SOLR
40.0000 mg | Freq: Three times a day (TID) | INTRAMUSCULAR | Status: DC
  Administered 2013-06-08 – 2013-06-09 (×3): 40 mg via INTRAVENOUS
  Filled 2013-06-08 (×4): qty 1

## 2013-06-08 MED ORDER — BIOTENE DRY MOUTH MT LIQD
15.0000 mL | Freq: Two times a day (BID) | OROMUCOSAL | Status: DC
  Administered 2013-06-08 – 2013-06-11 (×8): 15 mL via OROMUCOSAL

## 2013-06-08 MED ORDER — METHYLPREDNISOLONE SODIUM SUCC 125 MG IJ SOLR
60.0000 mg | Freq: Three times a day (TID) | INTRAMUSCULAR | Status: DC
  Filled 2013-06-08 (×3): qty 0.96

## 2013-06-08 MED ORDER — INSULIN ASPART 100 UNIT/ML ~~LOC~~ SOLN
0.0000 [IU] | Freq: Three times a day (TID) | SUBCUTANEOUS | Status: DC
  Administered 2013-06-08: 3 [IU] via SUBCUTANEOUS
  Administered 2013-06-08: 1 [IU] via SUBCUTANEOUS
  Administered 2013-06-09: 3 [IU] via SUBCUTANEOUS
  Administered 2013-06-09: 1 [IU] via SUBCUTANEOUS
  Administered 2013-06-09: 3 [IU] via SUBCUTANEOUS
  Administered 2013-06-10: 1 [IU] via SUBCUTANEOUS
  Administered 2013-06-10 – 2013-06-11 (×3): 2 [IU] via SUBCUTANEOUS
  Administered 2013-06-12: 12:00:00 1 [IU] via SUBCUTANEOUS

## 2013-06-08 MED ORDER — METOPROLOL SUCCINATE ER 25 MG PO TB24
25.0000 mg | ORAL_TABLET | Freq: Every day | ORAL | Status: DC
  Administered 2013-06-09 – 2013-06-11 (×3): 25 mg via ORAL
  Filled 2013-06-08 (×4): qty 1

## 2013-06-08 MED ORDER — CHLORHEXIDINE GLUCONATE 0.12 % MT SOLN
15.0000 mL | Freq: Two times a day (BID) | OROMUCOSAL | Status: DC
  Administered 2013-06-08 – 2013-06-12 (×8): 15 mL via OROMUCOSAL
  Filled 2013-06-08 (×9): qty 15

## 2013-06-08 NOTE — Consult Note (Signed)
PULMONARY  / CRITICAL CARE MEDICINE  Name: Caldonia Leap MRN: [id number] DOB: 09/15/1950 Health care pcp - all at [facility]    ADMISSION DATE:  06/06/2013 CONSULTATION DATE:  10-10  REFERRING MD :  Janee Morn  PRIMARY SERVICE: [facility]  CHIEF COMPLAINT:  AMS  BRIEF PATIENT DESCRIPTION: MO 309 ils AAF with AMS and resp distress out of portion to her abg.   SIGNIFICANT EVENTS / STUDIES:  10-10 ct head>>No intracranial hemorrhage.  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  Poor delineation of the gray white differentiation. This may be  explained by artifact caused by the patient's habitus. Diffuse  anoxic/hypoxic injury can sometimes give a similar appearance and  therefore cannot be excluded.   LINES / TUBES:  CULTURES: 10-10 uc>> 10-10 bc x 2>> 10-10 Sputum if intubated>> 06/06/13 - UDS - negative 06/06/13 - Flu Virus panel>>> 06/06/13 - Multiplex resp virus panel >>>  Recent Results (from the past 240 hour(s))  CULTURE, BLOOD (ROUTINE X 2)     Status: None   Collection Time    06/06/13  8:42 AM      Result Value Range Status   Specimen Description BLOOD RIGHT FOREARM   Final   Special Requests BOTTLES DRAWN AEROBIC AND ANAEROBIC   Final   Culture  Setup Time     Final   Value: 06/06/2013 13:10     Performed at [facility]   Culture     Final   Value:        BLOOD CULTURE RECEIVED NO GROWTH TO DATE CULTURE WILL BE HELD FOR 5 DAYS BEFORE ISSUING A FINAL NEGATIVE REPORT     Performed at [facility]   Report Status PENDING   Incomplete  CULTURE, BLOOD (ROUTINE X 2)     Status: None   Collection Time    06/06/13  8:52 AM      Result Value Range Status   Specimen Description BLOOD LEFT FOREARM   Final   Special Requests BOTTLES DRAWN AEROBIC AND ANAEROBIC   Final   Culture  Setup Time     Final   Value: 06/06/2013 13:11     Performed at [facility]   Culture     Final   Value:        BLOOD  CULTURE RECEIVED NO GROWTH TO DATE CULTURE WILL BE HELD FOR 5 DAYS BEFORE ISSUING A FINAL NEGATIVE REPORT     Performed at [facility]   Report Status PENDING   Incomplete  MRSA PCR SCREENING     Status: None   Collection Time    06/06/13 11:31 AM      Result Value Range Status   MRSA by PCR NEGATIVE  NEGATIVE Final   Comment:            The GeneXpert MRSA Assay (FDA     approved for NASAL specimens     only), is one component of a     comprehensive MRSA colonization     surveillance program. It is not     intended to diagnose MRSA     infection nor to guide or     monitor treatment for     MRSA infections.     ANTIBIOTICS: 10/10 ceftriazone 10/10 06/06/13 azithro >.10/10 10-10 maxipime>>10/12 10-10 vanco>>10/12 10/11 levofloxacin>>>  SUBJECTIVE:  Off nimv, improved  VITAL SIGNS: Temp:  [97.6 F (36.4 C)-98 F (36.7 C)] 97.8 F (36.6 C) (10/12  0400) Pulse Rate:  [81-102] 91 (10/12 0800) Resp:  [12-24] 17 (10/12 0800) BP: (81-110)/(51-82) 101/57 mmHg (10/12 0800) SpO2:  [93 %-100 %] 95 % (10/12 0853) FiO2 (%):  [40 %] 40 % (10/12 0431) HEMODYNAMICS:   VENTILATOR SETTINGS: Vent Mode:  [-] BIPAP FiO2 (%):  [40 %] 40 % Set Rate:  [16 bmp] 16 bmp PEEP:  [8 cmH20] 8 cmH20 INTAKE / OUTPUT: Intake/Output     10/11 0701 - 10/12 0700 10/12 0701 - 10/13 0700   P.O. 600    I.V. (mL/kg) 408 (2.9)    IV Piggyback 700    Total Intake(mL/kg) 1708 (12.2)    Urine (mL/kg/hr) 445 (0.1)    Total Output 445     Net +1263            PHYSICAL EXAMINATION: General:  Improved general appearance Neuro:  nonfocal exam ,more alert HEENT:  Short neck. On bipap Cardiovascular:  HSD PPM noted Lungs:  Decreased air movement but improved apical Abdomen: obese, nt, nd no r Musculoskeletal: intact Skin: warm    PULMONARY  Recent Labs Lab 06/06/13 0747 06/06/13 2152  PHART 7.381 7.330*  PCO2ART 46.9* 50.0*  PO2ART 64.9* 74.7*  HCO3 27.2* 25.6*  TCO2 24.3 23.2   O2SAT 91.5 93.7    CBC  Recent Labs Lab 06/06/13 1230 06/07/13 0730 06/08/13 0400  HGB 13.4 12.1 12.4  HCT 40.7 38.1 36.7  WBC 8.8 10.5 12.2*  PLT 211 212 191    COAGULATION  Recent Labs Lab 06/06/13 1533  INR 1.09    CARDIAC    Recent Labs Lab 06/06/13 1533 06/06/13 2258 06/07/13 0730  TROPONINI <0.30 <0.30 <0.30    Recent Labs Lab 06/06/13 1430 06/07/13 0730  PROBNP 212.1* 181.7*     CHEMISTRY  Recent Labs Lab 06/06/13 0737 06/06/13 1230 06/07/13 0730 06/08/13 0400  NA 142  --  138 138  K 4.4  --  5.1 5.0  CL 102  --  105 104  CO2 27  --  23 24  GLUCOSE 156*  --  167* 184*  BUN 21  --  29* 44*  CREATININE 1.81* 1.90* 1.80* 2.03*  CALCIUM 9.1  --  8.7 9.1  MG  --  1.8 1.9  --   PHOS  --   --  4.1  --    Estimated Creatinine Clearance: 40.2 ml/min (by C-G formula based on Cr of 2.03).   LIVER  Recent Labs Lab 06/06/13 1533  AST 19  ALT 19  ALKPHOS 90  BILITOT 0.3  PROT 7.2  ALBUMIN 3.0*  INR 1.09     INFECTIOUS  Recent Labs Lab 06/06/13 1534 06/06/13 1535 06/07/13 0730 06/08/13 0400  LATICACIDVEN 2.1  --   --   --   PROCALCITON  --  <0.10 <0.10 <0.10     ENDOCRINE CBG (last 3)  No results found for this basename: GLUCAP,  in the last 72 hours   IMAGING x48h  Ct Head Wo Contrast  06/06/2013   CLINICAL DATA:  Acute encephalopathy. Defibrillator in place. Dilated cardiomyopathy. Hypertension.  EXAM: CT HEAD WITHOUT CONTRAST  TECHNIQUE: Contiguous axial images were obtained from the base of the skull through the vertex without intravenous contrast.  COMPARISON:  05/06/2013  FINDINGS: No intracranial hemorrhage.  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  Poor delineation of the gray white differentiation. This may be explained by artifact caused by the patient's habitus. Diffuse anoxic/hypoxic injury can sometimes give  a similar appearance and therefore cannot be excluded.  No  hydrocephalus.  No intracranial mass lesion noted on this unenhanced exam.  IMPRESSION: No intracranial hemorrhage.  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  Poor delineation of the gray white differentiation. This may be explained by artifact caused by the patient's habitus. Diffuse anoxic/hypoxic injury can sometimes give a similar appearance and therefore cannot be excluded.  These results were called by telephone at the time of interpretation on 06/06/2013 at 6:49 PM to Darl Pikes patient's nurse, who verbally acknowledged these results.   Electronically Signed   By: Bridgett Larsson M.D.   On: 06/06/2013 18:50   Dg Chest Port 1 View  06/08/2013   CLINICAL DATA:  Assess edema. COPD.  EXAM: PORTABLE CHEST - 1 VIEW  COMPARISON:  The 06/07/2013.  FINDINGS: AICD sequential pacemaker enters from the left with leads unchanged in position.  Cardiomegaly.  Pulmonary vascular congestion.  Slight improved aeration left base. Minimal blunting left costophrenic angle remains. Small pleural effusion not excluded.  IMPRESSION: Improved aeration left lung base. Please see above.   Electronically Signed   By: Bridgett Larsson M.D.   On: 06/08/2013 07:57   Dg Chest Port 1 View  06/07/2013   CLINICAL DATA:  Respiratory failure.  EXAM: PORTABLE CHEST - 1 VIEW  COMPARISON:  06/06/2013  FINDINGS: Bibasilar opacity, left greater than right, is stable. No new lung opacities.  The cardiac silhouette is mildly enlarged. Left anterior chest wall ICD is stable in well positioned.  IMPRESSION: Left greater than right lung base opacities are unchanged from the previous day's study, and may reflect atelectasis, infiltrate or a combination. Associated pleural fluid is suggested on the left.  No change from the previous day's study.   Electronically Signed   By: Amie Portland M.D.   On: 06/07/2013 07:09      LABS: Principal Problem:   Acute-on-chronic respiratory failure Active Problems:    Dilated cardiomyopathy   COPD (chronic obstructive pulmonary disease)   Hypertension   Cardiac defibrillator in place   Morbid obesity   HCAP (healthcare-associated pneumonia)   Tachycardia   COPD with acute exacerbation/ Early exac   Chronic renal insufficiency   Fall at home   Unspecified constipation/ chronic   Acute encephalopathy   Abnormal echocardiogram   ASSESSMENT / PLAN:  PULMONARY A:Acute on chronic hypoxic resp failure coupled with decreased alertness and morbid obesity. Suspect untreated OSA . +CPD with bronchospasm. Likely Resp Virus +/- CAP Left effusion? P:   -Bipap, to prn as improved -pcxr in am  - Nebs - steroids, maintain, can reduce -lasix per cards -may need [place] assessment left base, follow in am   CARDIOVASCULAR A: CM, PPM,HTN. Reported cardiomyopathy Trop neg  P:  -Diuresis per cards, see renal fxn increase -cards per [facility]  RENAL A:  Acute renal failure (baseline 1.5), int edema? P:   Even goals Lasix may need to hold if BP low Chem in am   GASTROINTESTINAL A:  GI protection P:   PPI NPO dc, start doet  INFECTIOUS A:  Presumed HCAP v CAP. Sepsis P:   - flu pcr - neg - multiplex virus pcr - urine leg (pending)and strep (neg) - vanc/cefepime, dc as cultur eneg -maintain levo monotherapy, especially as concerns atypical   ENDOCRINE   A: Check tsh (.826 4-13) P:   -check tsh - 1.3 Add ssi Reduce roids  NEUROLOGIC A: Acute encephlaopathy. Probably related to hypercarbia and Sepsis  syndrome  P:   resolved  TODAY'S SUMMARY: NIMV to prn, narrow to levo as mono abx, lasix per cards, some caution with BP and crt  Mcarthur Rossetti. Tyson Alias, MD, FACP Pgr: [phone] [facility]

## 2013-06-08 NOTE — Progress Notes (Signed)
[facility] PROGRESS NOTE  Monique Kane ZOX:[id number] DOB: 1951/08/25 DOA: 06/06/2013 PCP: Iona Hansen, NP  Assessment/Plan: #1 acute on chronic hypoxic respiratory failure Questionable etiology. Likely multifactorial secondary to a healthcare associated pneumonia, probable obesity hypoventilation syndrome/OSA, acute COPD exacerbation and left-sided effusion noted on chest x-ray. Some clinical improvement on BiPAP. Influenza PCR panel is negative. Sputum Gram stain and cultures pending. Blood cultures are pending. Urine Legionella negative, urine pneumococcus antigen is negative. Respiratory viral panel is pending. Continue empiric IV vancomycin, IV Levaquin and IV cefepime. Continue IV Solu-Medrol, Lopressor, nebs, BiPAP. PCCM. is following and appreciate input and recommendations.  #2 healthcare associated pneumonia Patient states was recently admitted at [place] one month prior to admission. Chest x-ray on presentation was worrisome for pneumonia. Sputum Gram stain and cultures pending. Urine pneumococcus antigen is negative. Urine Legionella antigen is pending. Continue empiric IV vancomycin, IV cefepime, IV Levaquin, nebulizer treatments.  #3 acute encephalopathy Clinical improvement. Likely multifactorial secondary to obesity hypoventilation syndrome/OSA, healthcare associated pneumonia, acute COPD exacerbation. CT head negative. Ammonia level is normal. Patient was placed on the BiPAP with  clinical improvement. Patient currently on BiPAP overnight and now on Libby, more alert answering questions following commands. Continue empiric IV antibiotics, IV steroids taper, nebulizer treatments, BiPAP. PCCM ff.  #4 acute on chronic kidney disease Baseline creatinine about 1.5. Patient was on diuretics prior to admission. Patient was given a low bolus of IV fluids secondary to borderline blood pressure issues and she is KVO. Increaing creeatinine. Check urine sodium and creatinine.  Check renal US. Continue to hold ARB and diuretics. Follow.  #5 acute COPD exacerbation Likely triggered by a healthcare associated pneumonia. Clinical improvement. No wheezing noted on exam however patient is obese. Continue IV steroid taper, empiric IV antibiotics, nebulizer treatments, oxygen, BiPAP. Pulmonary following.  #6  abnormal 2-D echo/history of coronary artery disease/dilated cardiomyopathy/history of CHF/history of PPM  Patient denies any chest pain. However patient presented with worsening shortness of breath and wheezing. Pro BNP is 181. Cardiac enzymes are negative x2. 2-D echo is however abnormal with EF of 40% and wall motion abnormalities in the basal to mid inferoseptal and anteroseptal dyskinesias. Continue beta blocker. Diuretics on hold secondary to problem #4 and borderline BP. Cardiology ff and appreciate input and rxcs, Patient with bibasilar crackles however borderline BP in 90s will continue to hold diuretics.  #7 tachycardia Likely secondary to acute illness. Heart rate has improved. Patient was restarted on  Beta blocker. Continue treatment for healthcare associated pneumonia, acute COPD exacerbation. Follow.  #8 left pleural effusion Noted on chest x-ray. Pulmonary following.  #9 Fall Mechanical in nature. PT/OT.  #10 chronic constipation Continue current bowel regimen.  #11 prophylaxis PPI for GI prophylaxis. Heparin for DVT prophylaxis.   Code Status: Full Family Communication: Updated patient at bedside. Disposition Plan: Remain the step down.   Consultants:  PCCM: Dr. Marchelle Gearing 06/06/2013  Cardiology: Dr Delton See 06/07/13    Procedures:  CT head 06/06/2013  2-D echo 06/06/2013  Chest x-ray 06/06/2013  Antibiotics:  IV vancomycin 06/06/2013  IV cefepime 06/06/2013  IV azithromycin 06/06/2013---> 06/07/13  IV Levaquin 06/07/13  HPI/Subjective: Patient alert, following commands, answering questions appropriately. Off BiPAP.  Patient feeling better. Was on BIPAP over night.  Objective: Filed Vitals:   06/08/13 0700  BP: 92/55  Pulse: 81  Temp:   Resp: 14    Intake/Output Summary (Last 24 hours) at 06/08/13 0846 Last data filed at 06/08/13 0600  Gross per 24  hour  Intake   1633 ml  Output    425 ml  Net   1208 ml   Filed Weights   06/06/13 0713 06/06/13 1204  Weight: 136.079 kg (300 lb) 140.4 kg (309 lb 8.4 oz)    Exam:   General:  Obese, alert, BIPAP off  Cardiovascular: Distant heart sounds. RR  Respiratory: Bibasilar crackles, no wheezing  Abdomen: Soft/NT/ND/+BS  Musculoskeletal: trace BLE edema  Data Reviewed: Basic Metabolic Panel:  Recent Labs Lab 06/06/13 0737 06/06/13 1230 06/07/13 0730 06/08/13 0400  NA 142  --  138 138  K 4.4  --  5.1 5.0  CL 102  --  105 104  CO2 27  --  23 24  GLUCOSE 156*  --  167* 184*  BUN 21  --  29* 44*  CREATININE 1.81* 1.90* 1.80* 2.03*  CALCIUM 9.1  --  8.7 9.1  MG  --  1.8 1.9  --   PHOS  --   --  4.1  --    Liver Function Tests:  Recent Labs Lab 06/06/13 1533  AST 19  ALT 19  ALKPHOS 90  BILITOT 0.3  PROT 7.2  ALBUMIN 3.0*   No results found for this basename: LIPASE, AMYLASE,  in the last 168 hours  Recent Labs Lab 06/06/13 1832  AMMONIA 26   CBC:  Recent Labs Lab 06/06/13 0737 06/06/13 1230 06/07/13 0730 06/08/13 0400  WBC 10.6* 8.8 10.5 12.2*  NEUTROABS 5.0  --  9.0*  --   HGB 13.2 13.4 12.1 12.4  HCT 40.5 40.7 38.1 36.7  MCV 77.7* 77.5* 78.4 77.6*  PLT 182 211 212 191   Cardiac Enzymes:  Recent Labs Lab 06/06/13 1533 06/06/13 2258 06/07/13 0730  TROPONINI <0.30 <0.30 <0.30   BNP (last 3 results)  Recent Labs  05/06/13 1713 06/06/13 1430 06/07/13 0730  PROBNP 101.7 212.1* 181.7*   CBG: No results found for this basename: GLUCAP,  in the last 168 hours  Recent Results (from the past 240 hour(s))  CULTURE, BLOOD (ROUTINE X 2)     Status: None   Collection Time    06/06/13  8:42 AM       Result Value Range Status   Specimen Description BLOOD RIGHT FOREARM   Final   Special Requests BOTTLES DRAWN AEROBIC AND ANAEROBIC   Final   Culture  Setup Time     Final   Value: 06/06/2013 13:10     Performed at [facility]   Culture     Final   Value:        BLOOD CULTURE RECEIVED NO GROWTH TO DATE CULTURE WILL BE HELD FOR 5 DAYS BEFORE ISSUING A FINAL NEGATIVE REPORT     Performed at [facility]   Report Status PENDING   Incomplete  CULTURE, BLOOD (ROUTINE X 2)     Status: None   Collection Time    06/06/13  8:52 AM      Result Value Range Status   Specimen Description BLOOD LEFT FOREARM   Final   Special Requests BOTTLES DRAWN AEROBIC AND ANAEROBIC   Final   Culture  Setup Time     Final   Value: 06/06/2013 13:11     Performed at [facility]   Culture     Final   Value:        BLOOD CULTURE RECEIVED NO GROWTH TO DATE CULTURE WILL BE HELD FOR 5 DAYS BEFORE ISSUING A  FINAL NEGATIVE REPORT     Performed at [facility]   Report Status PENDING   Incomplete  MRSA PCR SCREENING     Status: None   Collection Time    06/06/13 11:31 AM      Result Value Range Status   MRSA by PCR NEGATIVE  NEGATIVE Final   Comment:            The GeneXpert MRSA Assay (FDA     approved for NASAL specimens     only), is one component of a     comprehensive MRSA colonization     surveillance program. It is not     intended to diagnose MRSA     infection nor to guide or     monitor treatment for     MRSA infections.     Studies: Ct Head Wo Contrast  06/06/2013   CLINICAL DATA:  Acute encephalopathy. Defibrillator in place. Dilated cardiomyopathy. Hypertension.  EXAM: CT HEAD WITHOUT CONTRAST  TECHNIQUE: Contiguous axial images were obtained from the base of the skull through the vertex without intravenous contrast.  COMPARISON:  05/06/2013  FINDINGS: No intracranial hemorrhage.  Remote small infarct right lenticular nucleus/anterior limb right internal  capsule.  Poor delineation of the gray white differentiation. This may be explained by artifact caused by the patient's habitus. Diffuse anoxic/hypoxic injury can sometimes give a similar appearance and therefore cannot be excluded.  No hydrocephalus.  No intracranial mass lesion noted on this unenhanced exam.  IMPRESSION: No intracranial hemorrhage.  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  Poor delineation of the gray white differentiation. This may be explained by artifact caused by the patient's habitus. Diffuse anoxic/hypoxic injury can sometimes give a similar appearance and therefore cannot be excluded.  These results were called by telephone at the time of interpretation on 06/06/2013 at 6:49 PM to Darl Pikes patient's nurse, who verbally acknowledged these results.   Electronically Signed   By: Bridgett Larsson M.D.   On: 06/06/2013 18:50   Dg Chest Port 1 View  06/08/2013   CLINICAL DATA:  Assess edema. COPD.  EXAM: PORTABLE CHEST - 1 VIEW  COMPARISON:  The 06/07/2013.  FINDINGS: AICD sequential pacemaker enters from the left with leads unchanged in position.  Cardiomegaly.  Pulmonary vascular congestion.  Slight improved aeration left base. Minimal blunting left costophrenic angle remains. Small pleural effusion not excluded.  IMPRESSION: Improved aeration left lung base. Please see above.   Electronically Signed   By: Bridgett Larsson M.D.   On: 06/08/2013 07:57   Dg Chest Port 1 View  06/07/2013   CLINICAL DATA:  Respiratory failure.  EXAM: PORTABLE CHEST - 1 VIEW  COMPARISON:  06/06/2013  FINDINGS: Bibasilar opacity, left greater than right, is stable. No new lung opacities.  The cardiac silhouette is mildly enlarged. Left anterior chest wall ICD is stable in well positioned.  IMPRESSION: Left greater than right lung base opacities are unchanged from the previous day's study, and may reflect atelectasis, infiltrate or a combination. Associated pleural fluid is  suggested on the left.  No change from the previous day's study.   Electronically Signed   By: Amie Portland M.D.   On: 06/07/2013 07:09    Scheduled Meds: . allopurinol  100 mg Oral Daily  . antiseptic oral rinse  15 mL Mouth Rinse q12n4p  . aspirin EC  81 mg Oral Daily  . ceFEPime (MAXIPIME) IV  2 g Intravenous Q24H  . chlorhexidine  15 mL Mouth Rinse BID  . heparin  5,000 Units Subcutaneous Q8H  . ipratropium  0.5 mg Nebulization Q4H  . levalbuterol  0.63 mg Nebulization Q4H  . levofloxacin (LEVAQUIN) IV  750 mg Intravenous Q48H  . methylPREDNISolone (SOLU-MEDROL) injection  80 mg Intravenous Q8H  . metoprolol succinate  25 mg Oral Daily  . pantoprazole (PROTONIX) IV  40 mg Intravenous Q24H  . polyethylene glycol  17 g Oral BID  . senna-docusate  1 tablet Oral QHS  . simvastatin  10 mg Oral q1800  . sodium chloride  3 mL Intravenous Q12H  . vancomycin  1,750 mg Intravenous Q24H   Continuous Infusions: . sodium chloride 10 mL/hr at 06/07/13 1000    Principal Problem:   Acute-on-chronic respiratory failure Active Problems:   Dilated cardiomyopathy   COPD (chronic obstructive pulmonary disease)   Hypertension   Cardiac defibrillator in place   Morbid obesity   HCAP (healthcare-associated pneumonia)   Tachycardia   COPD with acute exacerbation/ Early exac   Chronic renal insufficiency   Fall at home   Unspecified constipation/ chronic   Acute encephalopathy   Abnormal echocardiogram    Time spent: > 35 mins    [facility]  [facility] Pager [phone]. If 7PM-7AM, please contact night-coverage at [email or web address], password [facility] 06/08/2013, 8:46 AM  LOS: 2 days

## 2013-06-08 NOTE — Progress Notes (Addendum)
Patient Name: Monique Kane Date of Encounter: 06/08/2013    Principal Problem:   Acute-on-chronic respiratory failure Active Problems:   Dilated cardiomyopathy   COPD (chronic obstructive pulmonary disease)   Hypertension   Cardiac defibrillator in place   Morbid obesity   HCAP (healthcare-associated pneumonia)   Tachycardia   COPD with acute exacerbation/ Early exac   Chronic renal insufficiency   Fall at home   Unspecified constipation/ chronic   Acute encephalopathy   Abnormal echocardiogram    SUBJECTIVE  The patient states that her breathing has improved.  CURRENT MEDS . allopurinol  100 mg Oral Daily  . antiseptic oral rinse  15 mL Mouth Rinse q12n4p  . aspirin EC  81 mg Oral Daily  . ceFEPime (MAXIPIME) IV  2 g Intravenous Q24H  . chlorhexidine  15 mL Mouth Rinse BID  . heparin  5,000 Units Subcutaneous Q8H  . ipratropium  0.5 mg Nebulization Q4H  . levalbuterol  0.63 mg Nebulization Q4H  . levofloxacin (LEVAQUIN) IV  750 mg Intravenous Q48H  . methylPREDNISolone (SOLU-MEDROL) injection  80 mg Intravenous Q8H  . metoprolol  5 mg Intravenous Q8H  . pantoprazole (PROTONIX) IV  40 mg Intravenous Q24H  . polyethylene glycol  17 g Oral BID  . senna-docusate  1 tablet Oral QHS  . simvastatin  10 mg Oral q1800  . sodium chloride  3 mL Intravenous Q12H  . vancomycin  1,750 mg Intravenous Q24H    OBJECTIVE  Filed Vitals:   06/08/13 0431 06/08/13 0500 06/08/13 0600 06/08/13 0700  BP: 102/58 96/58 81/68  92/55  Pulse: 89 88 81 81  Temp:      TempSrc:      Resp: 20 13 16 14   Height:      Weight:      SpO2: 94% 95% 95% 93%    Intake/Output Summary (Last 24 hours) at 06/08/13 0801 Last data filed at 06/08/13 0600  Gross per 24 hour  Intake   1633 ml  Output    425 ml  Net   1208 ml   Filed Weights   06/06/13 0713 06/06/13 1204  Weight: 300 lb (136.079 kg) 309 lb 8.4 oz (140.4 kg)    PHYSICAL EXAM  General: Pleasant, NAD. Neuro: Alert and  oriented X 3. Moves all extremities spontaneously. Psych: Normal affect. HEENT:  Normal  Neck: Supple without bruits or JVD. Lungs:  Resp regular and unlabored, no wheezing, mild crackles at the basis Heart: RRR no s3, s4, or murmurs. Abdomen: Soft, non-tender, non-distended, BS + x 4.  Extremities: No clubbing, cyanosis or edema. DP/PT/Radials 2+ and equal bilaterally.  Accessory Clinical Findings  CBC  Recent Labs  06/06/13 0737  06/07/13 0730 06/08/13 0400  WBC 10.6*  < > 10.5 12.2*  NEUTROABS 5.0  --  9.0*  --   HGB 13.2  < > 12.1 12.4  HCT 40.5  < > 38.1 36.7  MCV 77.7*  < > 78.4 77.6*  PLT 182  < > 212 191  < > = values in this interval not displayed. Basic Metabolic Panel  Recent Labs  06/06/13 0737 06/06/13 1230 06/07/13 0730 06/08/13 0400  NA 142  --  138 138  K 4.4  --  5.1 5.0  CL 102  --  105 104  CO2 27  --  23 24  GLUCOSE 156*  --  167* 184*  BUN 21  --  29* 44*  CREATININE 1.81* 1.90* 1.80* 2.03*  CALCIUM 9.1  --  8.7 9.1  MG  --  1.8 1.9  --   PHOS  --   --  4.1  --    Liver Function Tests  Recent Labs  06/06/13 1533  AST 19  ALT 19  ALKPHOS 90  BILITOT 0.3  PROT 7.2  ALBUMIN 3.0*   No results found for this basename: LIPASE, AMYLASE,  in the last 72 hours Cardiac Enzymes  Recent Labs  06/06/13 1533 06/06/13 2258 06/07/13 0730  TROPONINI <0.30 <0.30 <0.30    Recent Labs  06/06/13 1230  TSH 1.358    TELE  AV paced rhythm, 85-95 BPM  Radiology/Studies  06/06/2013 18:50   Dg Chest Port 1 View  06/08/2013   CLINICAL DATA:  Assess edema. COPD.  EXAM: PORTABLE CHEST - 1 VIEW  COMPARISON:  The 06/07/2013.  FINDINGS: AICD sequential pacemaker enters from the left with leads unchanged in position.  Cardiomegaly.  Pulmonary vascular congestion.  Slight improved aeration left base. Minimal blunting left costophrenic angle remains. Small pleural effusion not excluded.  IMPRESSION: Improved aeration left lung base. Please see  above.     ASSESSMENT AND PLAN  62 year old female   1. Respiratory failure secondary to B/L pneumonia and COPD exacerbation - on iv steroids and antibiotics, significantly improved, off BiPAP since Friday night, mental status improved, no active wheezing.   2. 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 Abnormal wall motion on the echo might be possibly explained by BiV pacing. Troponins are negative and the patient is asymptomatic, there is no reason to proceed with further ischemia work up at this point.   The patient now has mild fluid overload on the physical exam. This is most probably secondary to iv fluids received after the admission. Crea continues to worsen, we can try 1 dose of Lasix 40 mg iv and follow Crea.  3. ARF - we will follow  4. Morbid obesity   Signed, Lars Masson MD

## 2013-06-08 NOTE — Progress Notes (Signed)
Pt ambulated in hallway with 4 LPM [place] . She was able to walk approximately past 2 rooms and then had to stop and bend over to catch her breath but then able to resume her walk past 2 more rooms ; WOB  Increased significantly during walk.O2sat maintained above 90% during walk. Aloysius Heinle, Georga Hacking, RN

## 2013-06-09 LAB — BASIC METABOLIC PANEL
BUN: 59 mg/dL — ABNORMAL HIGH (ref 6–23)
BUN: 57 mg/dL — ABNORMAL HIGH (ref 6–23)
CO2: 23 mEq/L (ref 19–32)
Calcium: 9.1 mg/dL (ref 8.4–10.5)
Chloride: 103 mEq/L (ref 96–112)
Creatinine, Ser: 2.34 mg/dL — ABNORMAL HIGH (ref 0.50–1.10)
Creatinine, Ser: 2.22 mg/dL — ABNORMAL HIGH (ref 0.50–1.10)
GFR calc Af Amer: 25 mL/min — ABNORMAL LOW (ref >90)
GFR calc Af Amer: 26 mL/min — ABNORMAL LOW (ref >90)
GFR calc non Af Amer: 21 mL/min — ABNORMAL LOW (ref >90)
Glucose, Bld: 151 mg/dL — ABNORMAL HIGH (ref 70–99)
Potassium: 5.1 mEq/L (ref 3.5–5.1)
Sodium: 135 mEq/L (ref 135–145)

## 2013-06-09 LAB — GLUCOSE, CAPILLARY: Glucose-Capillary: 202 mg/dL — ABNORMAL HIGH (ref 70–99)

## 2013-06-09 LAB — CBC
HCT: 35.9 % — ABNORMAL LOW (ref 36.0–46.0)
MCH: 25.3 pg — ABNORMAL LOW (ref 26.0–34.0)
MCHC: 33.4 g/dL (ref 30.0–36.0)
MCV: 75.6 fL — ABNORMAL LOW (ref 78.0–100.0)
Platelets: 194 10*3/uL (ref 150–400)
RDW: 17.5 % — ABNORMAL HIGH (ref 11.5–15.5)

## 2013-06-09 LAB — PRO B NATRIURETIC PEPTIDE: Pro B Natriuretic peptide (BNP): 240.9 pg/mL — ABNORMAL HIGH (ref 0–125)

## 2013-06-09 MED ORDER — LEVOFLOXACIN 750 MG PO TABS
750.0000 mg | ORAL_TABLET | ORAL | Status: DC
  Administered 2013-06-09: 750 mg via ORAL
  Filled 2013-06-09 (×2): qty 1

## 2013-06-09 MED ORDER — PREDNISONE 20 MG PO TABS
20.0000 mg | ORAL_TABLET | Freq: Every day | ORAL | Status: DC
  Administered 2013-06-10 – 2013-06-12 (×3): 20 mg via ORAL
  Filled 2013-06-09 (×5): qty 1

## 2013-06-09 MED ORDER — SODIUM CHLORIDE 0.9 % IV SOLN: INTRAVENOUS | Status: DC

## 2013-06-09 MED ORDER — PANTOPRAZOLE SODIUM 40 MG PO TBEC
40.0000 mg | DELAYED_RELEASE_TABLET | Freq: Every day | ORAL | Status: DC
  Administered 2013-06-09 – 2013-06-12 (×4): 40 mg via ORAL
  Filled 2013-06-09 (×4): qty 1

## 2013-06-09 NOTE — Progress Notes (Signed)
Patient ID: Dailah Opperman, female   DOB: 1951/07/08, 62 y.o.   MRN: [id number]   Patient Name: Klover Priestly Date of Encounter: 06/09/2013    Principal Problem:   Acute-on-chronic respiratory failure Active Problems:   Dilated cardiomyopathy   COPD (chronic obstructive pulmonary disease)   Hypertension   Cardiac defibrillator in place   Morbid obesity   HCAP (healthcare-associated pneumonia)   Tachycardia   COPD with acute exacerbation/ Early exac   Chronic renal insufficiency   Fall at home   Unspecified constipation/ chronic   Acute encephalopathy   Abnormal echocardiogram    SUBJECTIVE  The patient states that her breathing has improved. Ambulated today BP is soft and Cr is up  CURRENT MEDS . allopurinol  100 mg Oral Daily  . antiseptic oral rinse  15 mL Mouth Rinse q12n4p  . aspirin EC  81 mg Oral Daily  . chlorhexidine  15 mL Mouth Rinse BID  . heparin  5,000 Units Subcutaneous Q8H  . insulin aspart  0-9 Units Subcutaneous TID WC  . ipratropium  0.5 mg Nebulization Q4H  . levalbuterol  0.63 mg Nebulization Q4H  . levofloxacin (LEVAQUIN) IV  750 mg Intravenous Q48H  . methylPREDNISolone (SOLU-MEDROL) injection  40 mg Intravenous Q8H  . metoprolol succinate  25 mg Oral Daily  . pantoprazole (PROTONIX) IV  40 mg Intravenous Q24H  . polyethylene glycol  17 g Oral BID  . senna-docusate  1 tablet Oral QHS  . simvastatin  10 mg Oral q1800  . sodium chloride  3 mL Intravenous Q12H    OBJECTIVE  Filed Vitals:   06/09/13 0200 06/09/13 0400 06/09/13 0419 06/09/13 0600  BP: 110/60 94/69  92/66  Pulse: 97 99  101  Temp:  97.6 F (36.4 C)    TempSrc:  Oral    Resp: 11 11  11   Height:      Weight:      SpO2: 93% 93% 93% 94%    Intake/Output Summary (Last 24 hours) at 06/09/13 0805 Last data filed at 06/09/13 0500  Gross per 24 hour  Intake    880 ml  Output    850 ml  Net     30 ml   Filed Weights   06/06/13 0713 06/06/13 1204  Weight: 300 lb  (136.079 kg) 309 lb 8.4 oz (140.4 kg)    PHYSICAL EXAM  General: Pleasant, NAD. Neuro: Alert and oriented X 3. Moves all extremities spontaneously. Psych: Normal affect. HEENT:  Normal  Neck: Supple without bruits or JVD. Lungs:  Resp regular and unlabored, no wheezing, mild crackles at the basis Heart: RRR no s3, s4, or murmurs. Abdomen: Soft, non-tender, non-distended, BS + x 4.  Extremities: No clubbing, cyanosis or edema. DP/PT/Radials 2+ and equal bilaterally.  Accessory Clinical Findings  CBC  Recent Labs  06/07/13 0730 06/08/13 0400 06/09/13 0405  WBC 10.5 12.2* 12.0*  NEUTROABS 9.0*  --   --   HGB 12.1 12.4 12.0  HCT 38.1 36.7 35.9*  MCV 78.4 77.6* 75.6*  PLT 212 191 194   Basic Metabolic Panel  Recent Labs  06/06/13 1230  06/07/13 0730 06/08/13 0400 06/09/13 0405  NA  --   < > 138 138 135  K  --   < > 5.1 5.0 5.1  CL  --   < > 105 104 100  CO2  --   < > 23 24 23   GLUCOSE  --   < > 167* 184* 177*  BUN  --   < >  29* 44* 59*  CREATININE 1.90*  --  1.80* 2.03* 2.34*  CALCIUM  --   < > 8.7 9.1 9.1  MG 1.8  --  1.9  --   --   PHOS  --   --  4.1  --   --   < > = values in this interval not displayed. Liver Function Tests  Recent Labs  06/06/13 1533  AST 19  ALT 19  ALKPHOS 90  BILITOT 0.3  PROT 7.2  ALBUMIN 3.0*   No results found for this basename: LIPASE, AMYLASE,  in the last 72 hours Cardiac Enzymes  Recent Labs  06/06/13 1533 06/06/13 2258 06/07/13 0730  TROPONINI <0.30 <0.30 <0.30    Recent Labs  06/06/13 1230  TSH 1.358    TELE  AV paced rhythm, 85-95 BPM  Radiology/Studies  06/06/2013 18:50   Dg Chest Port 1 View  06/08/2013   CLINICAL DATA:  Assess edema. COPD.  EXAM: PORTABLE CHEST - 1 VIEW  COMPARISON:  The 06/07/2013.  FINDINGS: AICD sequential pacemaker enters from the left with leads unchanged in position.  Cardiomegaly.  Pulmonary vascular congestion.  Slight improved aeration left base. Minimal blunting left  costophrenic angle remains. Small pleural effusion not excluded.  IMPRESSION: Improved aeration left lung base. Please see above.     ASSESSMENT AND PLAN  62 year old female   1. Respiratory failure secondary to B/L pneumonia and COPD exacerbation - on iv steroids and antibiotics, significantly improved, off BiPAP since Friday night, mental status improved, no active wheezing.  Needs CPAP or Bipap arranged for home  2. 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 Abnormal wall motion on the echo might be possibly explained by BiV pacing. Troponins are negative and the patient is asymptomatic, there is no reason to proceed with further ischemia work up at this point.   No diuretic today given continued elevation in Cr and soft BP  Check BNP  3. ARF - follow   4. Morbid obesity  Etiology of all her problems   Signed, Charlton Haws MD

## 2013-06-09 NOTE — Progress Notes (Signed)
PT Cancellation Note  Patient Details Name: Jasma Seevers MRN: [id number] DOB: 04/29/1951   Cancelled Treatment:    Reason Eval/Treat Not Completed: PT screened, no needs identified, will sign off  Pt evaluated on 10/11 and is able to walk with RW with modified independce. Recommend she continue to walk with nursing/family ad lib.  She does not need skilled PT   Donnetta Hail 06/09/2013, 2:34 PM

## 2013-06-09 NOTE — Progress Notes (Signed)
[facility] PROGRESS NOTE  Monique Kane ZOX:[id number] DOB: [birth year] DOA: 06/06/2013 PCP: Iona Hansen, NP  Assessment/Plan: #1 acute on chronic hypoxic respiratory failure Questionable etiology. Likely multifactorial secondary to a healthcare associated pneumonia, probable obesity hypoventilation syndrome/OSA, acute COPD exacerbation and left-sided effusion noted on chest x-ray. Some clinical improvement on BiPAP. Influenza PCR panel is negative. Multiplex resp viral panel neg. Sputum Gram stain and cultures pending. Blood cultures are pending. Urine Legionella negative, urine pneumococcus antigen is negative. Continue empiric IV Levaquin. Continue IV Solu-Medrol, Lopressor, nebs, BiPAP prn. PCCM. is following and appreciate input and recommendations.  #2 healthcare associated pneumonia Patient states was recently admitted at [place] one month prior to admission. Chest x-ray on presentation was worrisome for pneumonia. Sputum Gram stain and cultures pending. Urine pneumococcus antigen is negative. Urine Legionella antigen is pending. Influenza panel neg. Multiplex resp viral panel neg. Continue empiric IV Levaquin, nebulizer treatments.  #3 acute encephalopathy Clinical improvement. Likely multifactorial secondary to obesity hypoventilation syndrome/OSA, healthcare associated pneumonia, acute COPD exacerbation. CT head negative. Ammonia level is normal. Patient was placed on the BiPAP with  clinical improvement. Patient currently off BiPAP overnight and now on [place], more alert answering questions following commands. Close to baseline. Continue empiric IV antibiotics, IV steroids taper, nebulizer treatments, BiPAP prn. PCCM ff. Likely needs home cpap vs bipap. Defer to pulm.  #4 acute on chronic kidney disease Baseline creatinine about 1.5. Patient was on diuretics prior to admission. Patient was given a low bolus of IV fluids secondary to borderline blood pressure issues and she is  KVO. Increaing creatinine and now at 2.34. Urine sodium is 10. BP has been borderline. Renal US neg for hydronephrosis. UOP is 850/24hrs. Continue to hold ARB and diuretics. BNP pending. Will place on gentle hydration and follow renal function. Watch volume status closely. Follow.  #5 acute COPD exacerbation Likely triggered by a healthcare associated pneumonia. Clinical improvement. Minimal wheezing noted on exam however patient is obese. Continue IV steroid taper, empiric IV antibiotics, nebulizer treatments, oxygen, BiPAP prn. Pulmonary following.  #6  abnormal 2-D echo/history of coronary artery disease/dilated cardiomyopathy/history of CHF/history of PPM  Patient denies any chest pain. However patient presented with worsening shortness of breath and wheezing. Pro BNP is 181. Cardiac enzymes are negative x2. 2-D echo is however abnormal with EF of 40% and wall motion abnormalities in the basal to mid inferoseptal and anteroseptal dyskinesias. Continue beta blocker. Diuretics on hold secondary to problem #4 and borderline BP. Cardiology ff and appreciate input and rxcs, Continue to hold diuretics.  #7 tachycardia Likely secondary to acute illness. Heart rate has improved. Patient was restarted on  Beta blocker. Continue treatment for healthcare associated pneumonia, acute COPD exacerbation. Follow.  #8 left pleural effusion Noted on chest x-ray. Pulmonary following.  #9 Fall Mechanical in nature. PT/OT.  #10 chronic constipation Continue current bowel regimen.  #11 Morbid obesity  #12 prophylaxis PPI for GI prophylaxis. Heparin for DVT prophylaxis.   Code Status: Full Family Communication: Updated patient at bedside. Disposition Plan: Remain the step down.   Consultants:  PCCM: Dr. Marchelle Gearing 06/06/2013  Cardiology: Dr Delton See 06/07/13    Procedures:  CT head 06/06/2013  2-D echo 06/06/2013  Chest x-ray 06/06/2013  Antibiotics:  IV vancomycin 06/06/2013---->  06/08/13  IV cefepime 06/06/2013--->06/08/13  IV azithromycin 06/06/2013---> 06/07/13  IV Levaquin 06/07/13  HPI/Subjective: Patient alert, following commands, answering questions appropriately. Off BiPAP. Patient feeling better. On neb treatment.  Objective: Filed Vitals:   06/09/13  0722  BP:   Pulse:   Temp: 97.7 F (36.5 C)  Resp:     Intake/Output Summary (Last 24 hours) at 06/09/13 0840 Last data filed at 06/09/13 0500  Gross per 24 hour  Intake    880 ml  Output    850 ml  Net     30 ml   Filed Weights   06/06/13 0713 06/06/13 1204  Weight: 136.079 kg (300 lb) 140.4 kg (309 lb 8.4 oz)    Exam:   General:  Obese, alert, BIPAP off. On nebs  Cardiovascular: Distant heart sounds. RR  Respiratory: Minimal wheezing.  Abdomen: Soft/NT/ND/+BS  Musculoskeletal: No c/c/e  Data Reviewed: Basic Metabolic Panel:  Recent Labs Lab 06/06/13 0737 06/06/13 1230 06/07/13 0730 06/08/13 0400 06/09/13 0405  NA 142  --  138 138 135  K 4.4  --  5.1 5.0 5.1  CL 102  --  105 104 100  CO2 27  --  23 24 23   GLUCOSE 156*  --  167* 184* 177*  BUN 21  --  29* 44* 59*  CREATININE 1.81* 1.90* 1.80* 2.03* 2.34*  CALCIUM 9.1  --  8.7 9.1 9.1  MG  --  1.8 1.9  --   --   PHOS  --   --  4.1  --   --    Liver Function Tests:  Recent Labs Lab 06/06/13 1533  AST 19  ALT 19  ALKPHOS 90  BILITOT 0.3  PROT 7.2  ALBUMIN 3.0*   No results found for this basename: LIPASE, AMYLASE,  in the last 168 hours  Recent Labs Lab 06/06/13 1832  AMMONIA 26   CBC:  Recent Labs Lab 06/06/13 0737 06/06/13 1230 06/07/13 0730 06/08/13 0400 06/09/13 0405  WBC 10.6* 8.8 10.5 12.2* 12.0*  NEUTROABS 5.0  --  9.0*  --   --   HGB 13.2 13.4 12.1 12.4 12.0  HCT 40.5 40.7 38.1 36.7 35.9*  MCV 77.7* 77.5* 78.4 77.6* 75.6*  PLT 182 211 212 191 194   Cardiac Enzymes:  Recent Labs Lab 06/06/13 1533 06/06/13 2258 06/07/13 0730  TROPONINI <0.30 <0.30 <0.30   BNP (last 3  results)  Recent Labs  05/06/13 1713 06/06/13 1430 06/07/13 0730  PROBNP 101.7 212.1* 181.7*   CBG:  Recent Labs Lab 06/08/13 1136 06/08/13 1402 06/08/13 1720 06/08/13 2113 06/09/13 0744  GLUCAP 162* 125* 228* 206* 239*    Recent Results (from the past 240 hour(s))  CULTURE, BLOOD (ROUTINE X 2)     Status: None   Collection Time    06/06/13  8:42 AM      Result Value Range Status   Specimen Description BLOOD RIGHT FOREARM   Final   Special Requests BOTTLES DRAWN AEROBIC AND ANAEROBIC   Final   Culture  Setup Time     Final   Value: 06/06/2013 13:10     Performed at [facility]   Culture     Final   Value:        BLOOD CULTURE RECEIVED NO GROWTH TO DATE CULTURE WILL BE HELD FOR 5 DAYS BEFORE ISSUING A FINAL NEGATIVE REPORT     Performed at [facility]   Report Status PENDING   Incomplete  CULTURE, BLOOD (ROUTINE X 2)     Status: None   Collection Time    06/06/13  8:52 AM      Result Value Range Status   Specimen Description BLOOD LEFT FOREARM  Final   Special Requests BOTTLES DRAWN AEROBIC AND ANAEROBIC   Final   Culture  Setup Time     Final   Value: 06/06/2013 13:11     Performed at [facility]   Culture     Final   Value:        BLOOD CULTURE RECEIVED NO GROWTH TO DATE CULTURE WILL BE HELD FOR 5 DAYS BEFORE ISSUING A FINAL NEGATIVE REPORT     Performed at [facility]   Report Status PENDING   Incomplete  MRSA PCR SCREENING     Status: None   Collection Time    06/06/13 11:31 AM      Result Value Range Status   MRSA by PCR NEGATIVE  NEGATIVE Final   Comment:            The GeneXpert MRSA Assay (FDA     approved for NASAL specimens     only), is one component of a     comprehensive MRSA colonization     surveillance program. It is not     intended to diagnose MRSA     infection nor to guide or     monitor treatment for     MRSA infections.  RESPIRATORY VIRUS PANEL     Status: None   Collection Time     06/06/13  3:01 PM      Result Value Range Status   Source - RVPAN NASOPHARYNGEAL   Final   Respiratory Syncytial Virus A NOT DETECTED   Final   Respiratory Syncytial Virus B NOT DETECTED   Final   Influenza A NOT DETECTED   Final   Influenza B NOT DETECTED   Final   Parainfluenza 1 NOT DETECTED   Final   Parainfluenza 2 NOT DETECTED   Final   Parainfluenza 3 NOT DETECTED   Final   Metapneumovirus NOT DETECTED   Final   Rhinovirus NOT DETECTED   Final   Adenovirus NOT DETECTED   Final   Influenza A H1 NOT DETECTED   Final   Influenza A H3 NOT DETECTED   Final   Comment: (NOTE)           Normal Reference Range for each Analyte: NOT DETECTED     Testing performed using the Luminex xTAG Respiratory Viral Panel test     kit.     This test was developed and its performance characteristics determined     by [facility]. It has not been cleared or approved by the [place]     Food and Drug Administration. This test is used for clinical purposes.     It should not be regarded as investigational or for research. This     laboratory is certified under the Clinical Laboratory Improvement     Amendments of 1988 (CLIA) as qualified to perform high complexity     clinical laboratory testing.     Performed at [facility]     Studies: US Renal  06/08/2013   CLINICAL DATA:  Acute renal failure  EXAM: RENAL/URINARY TRACT ULTRASOUND COMPLETE  COMPARISON:  None.  FINDINGS: Right Kidney  Length: 10.1 cm. Echogenicity within normal limits. No mass or hydronephrosis visualized.  Left Kidney  Length: 10.1 cm. Echogenicity within normal limits. No mass or hydronephrosis visualized.  Bladder  Bladder not visualized.  IMPRESSION: Normal renal ultrasound.   Electronically Signed   By: Amie Portland M.D.   On: 06/08/2013 12:03   Dg  Chest Port 1 View  06/08/2013   CLINICAL DATA:  Assess edema. COPD.  EXAM: PORTABLE CHEST - 1 VIEW  COMPARISON:  The 06/07/2013.  FINDINGS: AICD sequential pacemaker  enters from the left with leads unchanged in position.  Cardiomegaly.  Pulmonary vascular congestion.  Slight improved aeration left base. Minimal blunting left costophrenic angle remains. Small pleural effusion not excluded.  IMPRESSION: Improved aeration left lung base. Please see above.   Electronically Signed   By: Bridgett Larsson M.D.   On: 06/08/2013 07:57    Scheduled Meds: . allopurinol  100 mg Oral Daily  . antiseptic oral rinse  15 mL Mouth Rinse q12n4p  . aspirin EC  81 mg Oral Daily  . chlorhexidine  15 mL Mouth Rinse BID  . heparin  5,000 Units Subcutaneous Q8H  . insulin aspart  0-9 Units Subcutaneous TID WC  . ipratropium  0.5 mg Nebulization Q4H  . levalbuterol  0.63 mg Nebulization Q4H  . levofloxacin (LEVAQUIN) IV  750 mg Intravenous Q48H  . methylPREDNISolone (SOLU-MEDROL) injection  40 mg Intravenous Q8H  . metoprolol succinate  25 mg Oral Daily  . pantoprazole (PROTONIX) IV  40 mg Intravenous Q24H  . polyethylene glycol  17 g Oral BID  . senna-docusate  1 tablet Oral QHS  . simvastatin  10 mg Oral q1800  . sodium chloride  3 mL Intravenous Q12H   Continuous Infusions: . sodium chloride 10 mL/hr at 06/08/13 1136    Principal Problem:   Acute-on-chronic respiratory failure Active Problems:   Dilated cardiomyopathy   COPD (chronic obstructive pulmonary disease)   Hypertension   Cardiac defibrillator in place   Morbid obesity   HCAP (healthcare-associated pneumonia)   Tachycardia   COPD with acute exacerbation/ Early exac   Chronic renal insufficiency   Fall at home   Unspecified constipation/ chronic   Acute encephalopathy   Abnormal echocardiogram    Time spent: > 35 mins    [facility]  [facility] Pager [phone]. If 7PM-7AM, please contact night-coverage at [email or web address], password [facility] 06/09/2013, 8:40 AM  LOS: 3 days

## 2013-06-09 NOTE — Progress Notes (Addendum)
PULMONARY  / CRITICAL CARE MEDICINE  Name: Monique Kane MRN: [id number] DOB: May 22, 1951    ADMISSION DATE:  06/06/2013 CONSULTATION DATE:  06/07/2013   REFERRING MD :  Ramiro Harvest  CHIEF COMPLAINT:  Change in mental status  BRIEF PATIENT DESCRIPTION:  62 yo female former smoker admitted with fever, progressive dyspnea, chills, wheeze, dry cough from pneumonia.  Developed progressive mental status change, and PCCM consulted.  She is followed by Dr. Deboraha Sprang with pulmonary service in [facility].  PMHx of COPD on home oxygen, dilated cardiomyopathy s/p AICD, diastolic CHF, HTN, Jehovah's witness  SIGNIFICANT EVENTS: 10/10 Admit 10/11 PCCM consulted, cardiology consulted  STUDIES:  10/10 CT head >> Remote small Rt lenticular nucleus/anterior limb right  internal capsule infarct 10/10 Echo >> EF 40% 10/12 Renal u/s >> normal  LINES / TUBES: PIV   CULTURES: Respiratory viral panel 10/10 >> negative Blood 10/10 >> negative  ANTIBIOTICS: Zithromax 10/10 >> 10/10  Cefepime 10/10 >> 10/11 Vancomycin 10/10 >> 10/11 Levaquin 10/11 >>   SUBJECTIVE:  Breathing better.  Denies cough, wheeze, or chest pain.  VITAL SIGNS: Temp:  [97.5 F (36.4 C)-98.1 F (36.7 C)] 97.7 F (36.5 C) (10/13 0722) Pulse Rate:  [96-118] 101 (10/13 0600) Resp:  [11-22] 11 (10/13 0600) BP: (72-131)/(41-96) 92/66 mmHg (10/13 0600) SpO2:  [93 %-97 %] 95 % (10/13 0830) 4 liters [place]  INTAKE / OUTPUT: Intake/Output     10/12 0701 - 10/13 0700 10/13 0701 - 10/14 0700   P.O. 720 240   I.V. (mL/kg) 170 (1.2)    IV Piggyback     Total Intake(mL/kg) 890 (6.3) 240 (1.7)   Urine (mL/kg/hr) 850 (0.3)    Total Output 850     Net +40 +240          PHYSICAL EXAMINATION: General:  No distress Neuro:  Alert, normal strength HEENT:  No sinus tenderness Cardiovascular:  regular Lungs:  No wheeze Abdomen:  Soft, non tender Musculoskeletal:  No edema Skin:  No rashes  LABS:  CBC Recent Labs    06/07/13  0730  06/08/13  0400  06/09/13  0405  WBC  10.5  12.2*  12.0*  HGB  12.1  12.4  12.0  HCT  38.1  36.7  35.9*  PLT  212  191  194   Coag's Recent Labs     06/06/13  1533  INR  1.09   BMET Recent Labs     06/07/13  0730  06/08/13  0400  06/09/13  0405  NA  138  138  135  K  5.1  5.0  5.1  CL  105  104  100  CO2  23  24  23   BUN  29*  44*  59*  CREATININE  1.80*  2.03*  2.34*  GLUCOSE  167*  184*  177*   Electrolytes Recent Labs     06/06/13  1230  06/07/13  0730  06/08/13  0400  06/09/13  0405  CALCIUM   --   8.7  9.1  9.1  MG  1.8  1.9   --    --   PHOS   --   4.1   --    --    Sepsis Markers Recent Labs     06/06/13  1535  06/07/13  0730  06/08/13  0400  PROCALCITON  <0.10  <0.10  <0.10   ABG Recent Labs     06/06/13  2152  PHART  7.330*  PCO2ART  50.0*  PO2ART  74.7*   Liver Enzymes Recent Labs     06/06/13  1533  AST  19  ALT  19  ALKPHOS  90  BILITOT  0.3  ALBUMIN  3.0*   Cardiac Enzymes Recent Labs     06/06/13  1430  06/06/13  1533  06/06/13  2258  06/07/13  0730  TROPONINI   --   <0.30  <0.30  <0.30  PROBNP  212.1*   --    --   181.7*   Glucose Recent Labs     06/08/13  1136  06/08/13  1402  06/08/13  1720  06/08/13  2113  06/09/13  0744  GLUCAP  162*  125*  228*  206*  239*    Imaging US Renal  06/08/2013   CLINICAL DATA:  Acute renal failure  EXAM: RENAL/URINARY TRACT ULTRASOUND COMPLETE  COMPARISON:  None.  FINDINGS: Right Kidney  Length: 10.1 cm. Echogenicity within normal limits. No mass or hydronephrosis visualized.  Left Kidney  Length: 10.1 cm. Echogenicity within normal limits. No mass or hydronephrosis visualized.  Bladder  Bladder not visualized.  IMPRESSION: Normal renal ultrasound.   Electronically Signed   By: Amie Portland M.D.   On: 06/08/2013 12:03   Dg Chest Port 1 View  06/08/2013   CLINICAL DATA:  Assess edema. COPD.  EXAM: PORTABLE CHEST - 1 VIEW  COMPARISON:  The 06/07/2013.  FINDINGS:  AICD sequential pacemaker enters from the left with leads unchanged in position.  Cardiomegaly.  Pulmonary vascular congestion.  Slight improved aeration left base. Minimal blunting left costophrenic angle remains. Small pleural effusion not excluded.  IMPRESSION: Improved aeration left lung base. Please see above.   Electronically Signed   By: Bridgett Larsson M.D.   On: 06/08/2013 07:57    ASSESSMENT / PLAN:  62 yo female with acute on chronic hypoxic/hypercapnic respiratory failure 2nd to PNA with hx of COPD on home oxygen.  Likely also has component of sleep disordered breathing >> reports negative prior sleep study, but this was done in 2007.  A:  PNA. P: -Day 4 of Abx, currently on levaquin >> change to po on 10/13  A:  COPD. P: -change BD's to prn -f/u with pulmonary in [place] -change to prednisone 10/13 and wean off as tolerated over next 5 to 7 days  A: Possible sleep disordered breathing. P: -will need further assessment with pulmonary in [place]  A: Cardiomyopathy with acute on chronic systolic CHF, HTN, s/p AICD. P: -even to negative fluid balance per primary team and cardiology  A: Chronic kidney disease Stage IV. P: -monitor renal fx, urine outpt, electrolytes  A: Morbid obesity. P: -needs education about diet, exercise  Okay to transfer to medical floor.  She will need outpt f/u with Dr. Deboraha Sprang in [facility].  PCCM will sign off.  Please call if additional help needed.  Coralyn Helling, MD [facility] Pulmonary/Critical Care 06/09/2013, 10:39 AM Pager:  [phone] After 3pm call: [phone]

## 2013-06-09 NOTE — Progress Notes (Signed)
Inpatient Diabetes Program Recommendations  AACE/ADA: New Consensus Statement on Inpatient Glycemic Control (2013)  Target Ranges:  Prepandial:   less than 140 mg/dL      Peak postprandial:   less than 180 mg/dL (1-2 hours)      Critically ill patients:  140 - 180 mg/dL   Reason for Visit: Hyperglycemia  Results for MERYN, SARRACINO (MRN [id number]) as of 06/09/2013 16:08  Ref. Range 06/08/2013 17:20 06/08/2013 21:13 06/09/2013 07:44 06/09/2013 11:07  Glucose-Capillary Latest Range: 70-99 mg/dL 409 (H) 811 (H) 914 (H) 202 (H)  Results for MISAKI, SOZIO (MRN [id number]) as of 06/09/2013 16:08  Ref. Range 06/08/2013 04:00  Hemoglobin A1C Latest Range: <5.7 % 7.4 (H)    Inpatient Diabetes Program Recommendations Correction (SSI): Increase Novolog to moderate tidwc and hs Diet: Add CHO mod med to heart healthy diet  Note: Will follow while inpatient. Thank you. Ailene Ards, RD, LDN, CDE Inpatient Diabetes Coordinator [phone]

## 2013-06-10 LAB — GLUCOSE, CAPILLARY
Glucose-Capillary: 186 mg/dL — ABNORMAL HIGH (ref 70–99)
Glucose-Capillary: 123 mg/dL — ABNORMAL HIGH (ref 70–99)

## 2013-06-10 LAB — BASIC METABOLIC PANEL
BUN: 61 mg/dL — ABNORMAL HIGH (ref 6–23)
BUN: 58 mg/dL — ABNORMAL HIGH (ref 6–23)
Calcium: 9 mg/dL (ref 8.4–10.5)
Chloride: 103 mEq/L (ref 96–112)
Creatinine, Ser: 2.06 mg/dL — ABNORMAL HIGH (ref 0.50–1.10)
GFR calc Af Amer: 29 mL/min — ABNORMAL LOW (ref >90)
GFR calc Af Amer: 31 mL/min — ABNORMAL LOW (ref >90)
GFR calc non Af Amer: 25 mL/min — ABNORMAL LOW (ref >90)
GFR calc non Af Amer: 26 mL/min — ABNORMAL LOW (ref >90)
Potassium: 5.4 mEq/L — ABNORMAL HIGH (ref 3.5–5.1)
Potassium: 4.7 mEq/L (ref 3.5–5.1)
Sodium: 136 mEq/L (ref 135–145)

## 2013-06-10 LAB — CBC
HCT: 36.4 % (ref 36.0–46.0)
Hemoglobin: 12.4 g/dL (ref 12.0–15.0)
RBC: 4.8 MIL/uL (ref 3.87–5.11)
RDW: 17.8 % — ABNORMAL HIGH (ref 11.5–15.5)
WBC: 11.7 10*3/uL — ABNORMAL HIGH (ref 4.0–10.5)

## 2013-06-10 LAB — PRO B NATRIURETIC PEPTIDE: Pro B Natriuretic peptide (BNP): 201 pg/mL — ABNORMAL HIGH (ref 0–125)

## 2013-06-10 MED ORDER — INFLUENZA VAC SPLIT QUAD 0.5 ML IM SUSP: 0.5000 mL | INTRAMUSCULAR | Status: DC

## 2013-06-10 MED ORDER — PNEUMOCOCCAL VAC POLYVALENT 25 MCG/0.5ML IJ INJ: 0.5000 mL | INJECTION | INTRAMUSCULAR | Status: DC

## 2013-06-10 MED ORDER — LEVOFLOXACIN 750 MG PO TABS
750.0000 mg | ORAL_TABLET | Freq: Every day | ORAL | Status: DC
  Administered 2013-06-10 – 2013-06-12 (×3): 750 mg via ORAL
  Filled 2013-06-10 (×3): qty 1

## 2013-06-10 NOTE — Progress Notes (Signed)
[facility] PROGRESS NOTE  Monique Kane ZOX:[id number] DOB: December 05, 1950 DOA: 06/06/2013 PCP: Iona Hansen, NP  Assessment/Plan: #1 acute on chronic hypoxic respiratory failure Questionable etiology. Likely multifactorial secondary to a healthcare associated pneumonia, probable obesity hypoventilation syndrome/OSA, acute COPD exacerbation and left-sided effusion noted on chest x-ray. Clinical improvement on BiPAP. Influenza PCR panel is negative. Multiplex resp viral panel neg. Sputum Gram stain and cultures pending. Blood cultures are pending. Urine Legionella negative, urine pneumococcus antigen is negative. Continue Levaquin to complete 8 day course. Continue steriod taper, Lopressor, nebs.  #2 healthcare associated pneumonia Patient states was recently admitted at [place] one month prior to admission. Chest x-ray on presentation was worrisome for pneumonia. Sputum Gram stain and cultures pending. Urine pneumococcus antigen is negative. Urine Legionella antigen is pending. Influenza panel neg. Multiplex resp viral panel neg. Continue oral Levaquin to complete 8 day course, nebulizer treatments.  #3 acute encephalopathy Clinical improvement at baseline. Likely multifactorial secondary to obesity hypoventilation syndrome/OSA, healthcare associated pneumonia, acute COPD exacerbation. CT head negative. Ammonia level is normal. Patient was placed on the BiPAP with  clinical improvement. Patient currently off BiPAP overnight and now on [place], more alert answering questions following commands.  Continue levaquin, steroids taper, nebulizer treatments. Likely needs home cpap vs bipap. Needs to f/u with her pulm in [place] on d/c.  #4 acute on chronic kidney disease Baseline creatinine about 1.5. Patient was on diuretics prior to admission. Patient was given a low bolus of IV fluids secondary to borderline blood pressure issues and she is KVO. Creatinine starting to trend back down. Patient  autodiuresing with uop 1600/24hrs and neg 1020.  Urine sodium is 10. BP has been borderline. Renal US neg for hydronephrosis. Continue to hold ARB and diuretics. BNP pending. Continue to hold diuretics and follow.   #5 acute COPD exacerbation Likely triggered by a healthcare associated pneumonia. Clinical improvement. Minimal wheezing noted on exam however patient is obese. Continue steroid taper, oral levaquin to complete 8 day course, nebulizer treatments, oxygen.  #6  abnormal 2-D echo/history of coronary artery disease/dilated cardiomyopathy/history of CHF/history of PPM  Patient denies any chest pain. However patient presented with worsening shortness of breath and wheezing. Pro BNP is 181. Cardiac enzymes are negative x2. 2-D echo is however abnormal with EF of 40% and wall motion abnormalities in the basal to mid inferoseptal and anteroseptal dyskinesias. Continue beta blocker. Diuretics on hold secondary to problem #4 and borderline BP. UOP of 1600 cc. I/O= neg 1020.  Cardiology ff and appreciate input and rxcs, Continue to hold diuretics.  #7 tachycardia Likely secondary to acute illness. Heart rate has improved. Patient was restarted on  Beta blocker. Continue treatment for healthcare associated pneumonia, acute COPD exacerbation. Follow.  #8 left pleural effusion Noted on chest x-ray. Pulmonary following.  #9 Fall Mechanical in nature. PT/OT.  #10 chronic constipation Continue current bowel regimen.  #11 Morbid obesity  #12 prophylaxis PPI for GI prophylaxis. Heparin for DVT prophylaxis.   Code Status: Full Family Communication: Updated patient at bedside. Disposition Plan: Transfer to telemetry.   Consultants:  PCCM: Dr. Marchelle Gearing 06/06/2013  Cardiology: Dr Delton See 06/07/13    Procedures:  CT head 06/06/2013  2-D echo 06/06/2013  Chest x-ray 06/06/2013  Antibiotics:  IV vancomycin 06/06/2013----> 06/08/13  IV cefepime 06/06/2013--->06/08/13  IV  azithromycin 06/06/2013---> 06/07/13  IV Levaquin 06/07/13--->06/09/13  Oral levaquin 06/09/13  HPI/Subjective: Patient alert, following commands, answering questions appropriately. Off BiPAP. Patient feeling better.  Objective: Filed Vitals:  06/10/13 0600  BP:   Pulse: 108  Temp:   Resp: 28    Intake/Output Summary (Last 24 hours) at 06/10/13 0758 Last data filed at 06/10/13 0600  Gross per 24 hour  Intake    580 ml  Output   1600 ml  Net  -1020 ml   Filed Weights   06/06/13 0713 06/06/13 1204  Weight: 136.079 kg (300 lb) 140.4 kg (309 lb 8.4 oz)    Exam:   General:  Obese, alert, BIPAP off. On nebs  Cardiovascular: Distant heart sounds. RR  Respiratory: Bibasilar crackles.  Abdomen: Soft/NT/ND/+BS  Musculoskeletal: No c/c/e  Data Reviewed: Basic Metabolic Panel:  Recent Labs Lab 06/06/13 0737 06/06/13 1230 06/07/13 0730 06/08/13 0400 06/09/13 0405 06/09/13 1634 06/10/13 0343  NA 142  --  138 138 135 136 136  K 4.4  --  5.1 5.0 5.1 5.1 5.4*  CL 102  --  105 104 100 103 103  CO2 27  --  23 24 23 23 25   GLUCOSE 156*  --  167* 184* 177* 151* 151*  BUN 21  --  29* 44* 59* 57* 61*  CREATININE 1.81* 1.90* 1.80* 2.03* 2.34* 2.22* 2.06*  CALCIUM 9.1  --  8.7 9.1 9.1 8.9 9.0  MG  --  1.8 1.9  --   --   --   --   PHOS  --   --  4.1  --   --   --   --    Liver Function Tests:  Recent Labs Lab 06/06/13 1533  AST 19  ALT 19  ALKPHOS 90  BILITOT 0.3  PROT 7.2  ALBUMIN 3.0*   No results found for this basename: LIPASE, AMYLASE,  in the last 168 hours  Recent Labs Lab 06/06/13 1832  AMMONIA 26   CBC:  Recent Labs Lab 06/06/13 0737 06/06/13 1230 06/07/13 0730 06/08/13 0400 06/09/13 0405 06/10/13 0343  WBC 10.6* 8.8 10.5 12.2* 12.0* 11.7*  NEUTROABS 5.0  --  9.0*  --   --   --   HGB 13.2 13.4 12.1 12.4 12.0 12.4  HCT 40.5 40.7 38.1 36.7 35.9* 36.4  MCV 77.7* 77.5* 78.4 77.6* 75.6* 75.8*  PLT 182 211 212 191 194 183   Cardiac  Enzymes:  Recent Labs Lab 06/06/13 1533 06/06/13 2258 06/07/13 0730  TROPONINI <0.30 <0.30 <0.30   BNP (last 3 results)  Recent Labs  06/07/13 0730 06/09/13 1635 06/10/13 0343  PROBNP 181.7* 240.9* 201.0*   CBG:  Recent Labs Lab 06/08/13 2113 06/09/13 0744 06/09/13 1107 06/09/13 1727 06/09/13 2144  GLUCAP 206* 239* 202* 145* 131*    Recent Results (from the past 240 hour(s))  CULTURE, BLOOD (ROUTINE X 2)     Status: None   Collection Time    06/06/13  8:42 AM      Result Value Range Status   Specimen Description BLOOD RIGHT FOREARM   Final   Special Requests BOTTLES DRAWN AEROBIC AND ANAEROBIC   Final   Culture  Setup Time     Final   Value: 06/06/2013 13:10     Performed at [facility]   Culture     Final   Value:        BLOOD CULTURE RECEIVED NO GROWTH TO DATE CULTURE WILL BE HELD FOR 5 DAYS BEFORE ISSUING A FINAL NEGATIVE REPORT     Performed at [facility]   Report Status PENDING   Incomplete  CULTURE, BLOOD (ROUTINE  X 2)     Status: None   Collection Time    06/06/13  8:52 AM      Result Value Range Status   Specimen Description BLOOD LEFT FOREARM   Final   Special Requests BOTTLES DRAWN AEROBIC AND ANAEROBIC   Final   Culture  Setup Time     Final   Value: 06/06/2013 13:11     Performed at [facility]   Culture     Final   Value:        BLOOD CULTURE RECEIVED NO GROWTH TO DATE CULTURE WILL BE HELD FOR 5 DAYS BEFORE ISSUING A FINAL NEGATIVE REPORT     Performed at [facility]   Report Status PENDING   Incomplete  MRSA PCR SCREENING     Status: None   Collection Time    06/06/13 11:31 AM      Result Value Range Status   MRSA by PCR NEGATIVE  NEGATIVE Final   Comment:            The GeneXpert MRSA Assay (FDA     approved for NASAL specimens     only), is one component of a     comprehensive MRSA colonization     surveillance program. It is not     intended to diagnose MRSA     infection nor to guide or      monitor treatment for     MRSA infections.  RESPIRATORY VIRUS PANEL     Status: None   Collection Time    06/06/13  3:01 PM      Result Value Range Status   Source - RVPAN NASOPHARYNGEAL   Final   Respiratory Syncytial Virus A NOT DETECTED   Final   Respiratory Syncytial Virus B NOT DETECTED   Final   Influenza A NOT DETECTED   Final   Influenza B NOT DETECTED   Final   Parainfluenza 1 NOT DETECTED   Final   Parainfluenza 2 NOT DETECTED   Final   Parainfluenza 3 NOT DETECTED   Final   Metapneumovirus NOT DETECTED   Final   Rhinovirus NOT DETECTED   Final   Adenovirus NOT DETECTED   Final   Influenza A H1 NOT DETECTED   Final   Influenza A H3 NOT DETECTED   Final   Comment: (NOTE)           Normal Reference Range for each Analyte: NOT DETECTED     Testing performed using the Luminex xTAG Respiratory Viral Panel test     kit.     This test was developed and its performance characteristics determined     by [facility]. It has not been cleared or approved by the [place]     Food and Drug Administration. This test is used for clinical purposes.     It should not be regarded as investigational or for research. This     laboratory is certified under the Clinical Laboratory Improvement     Amendments of 1988 (CLIA) as qualified to perform high complexity     clinical laboratory testing.     Performed at [facility]     Studies: US Renal  06/08/2013   CLINICAL DATA:  Acute renal failure  EXAM: RENAL/URINARY TRACT ULTRASOUND COMPLETE  COMPARISON:  None.  FINDINGS: Right Kidney  Length: 10.1 cm. Echogenicity within normal limits. No mass or hydronephrosis visualized.  Left Kidney  Length: 10.1 cm. Echogenicity within  normal limits. No mass or hydronephrosis visualized.  Bladder  Bladder not visualized.  IMPRESSION: Normal renal ultrasound.   Electronically Signed   By: Amie Portland M.D.   On: 06/08/2013 12:03    Scheduled Meds: . allopurinol  100 mg Oral Daily  .  antiseptic oral rinse  15 mL Mouth Rinse q12n4p  . aspirin EC  81 mg Oral Daily  . chlorhexidine  15 mL Mouth Rinse BID  . heparin  5,000 Units Subcutaneous Q8H  . insulin aspart  0-9 Units Subcutaneous TID WC  . levofloxacin  750 mg Oral Q48H  . metoprolol succinate  25 mg Oral Daily  . pantoprazole  40 mg Oral Daily  . polyethylene glycol  17 g Oral BID  . predniSONE  20 mg Oral Q breakfast  . senna-docusate  1 tablet Oral QHS  . simvastatin  10 mg Oral q1800  . sodium chloride  3 mL Intravenous Q12H   Continuous Infusions:    Principal Problem:   Acute-on-chronic respiratory failure Active Problems:   Dilated cardiomyopathy   COPD (chronic obstructive pulmonary disease)   Hypertension   Cardiac defibrillator in place   Morbid obesity   HCAP (healthcare-associated pneumonia)   Tachycardia   COPD with acute exacerbation/ Early exac   Chronic renal insufficiency   Fall at home   Unspecified constipation/ chronic   Acute encephalopathy   Abnormal echocardiogram    Time spent: > 35 mins    [facility]  [facility] Pager [phone]. If 7PM-7AM, please contact night-coverage at [email or web address], password [facility] 06/10/2013, 7:58 AM  LOS: 4 days

## 2013-06-10 NOTE — Progress Notes (Signed)
Patient ID: Monique Kane, female   DOB: [birth year], 62 y.o.   MRN: [id number]   Patient Name: Monique Kane Date of Encounter: 06/10/2013    Principal Problem:   Acute-on-chronic respiratory failure Active Problems:   Dilated cardiomyopathy   COPD (chronic obstructive pulmonary disease)   Hypertension   Cardiac defibrillator in place   Morbid obesity   HCAP (healthcare-associated pneumonia)   Tachycardia   COPD with acute exacerbation/ Early exac   Chronic renal insufficiency   Fall at home   Unspecified constipation/ chronic   Acute encephalopathy   Abnormal echocardiogram    SUBJECTIVE  No complaints sitting in chair   CURRENT MEDS . allopurinol  100 mg Oral Daily  . antiseptic oral rinse  15 mL Mouth Rinse q12n4p  . aspirin EC  81 mg Oral Daily  . chlorhexidine  15 mL Mouth Rinse BID  . heparin  5,000 Units Subcutaneous Q8H  . insulin aspart  0-9 Units Subcutaneous TID WC  . levofloxacin  750 mg Oral Q48H  . metoprolol succinate  25 mg Oral Daily  . pantoprazole  40 mg Oral Daily  . polyethylene glycol  17 g Oral BID  . predniSONE  20 mg Oral Q breakfast  . senna-docusate  1 tablet Oral QHS  . simvastatin  10 mg Oral q1800  . sodium chloride  3 mL Intravenous Q12H    OBJECTIVE  Filed Vitals:   06/10/13 0000 06/10/13 0200 06/10/13 0400 06/10/13 0600  BP: 119/69 133/76 101/67   Pulse: 83 80 80 108  Temp: 98.3 F (36.8 C)  97.4 F (36.3 C)   TempSrc: Oral  Oral   Resp: 13 10 11 28   Height:      Weight:      SpO2: 96% 95% 95% 97%    Intake/Output Summary (Last 24 hours) at 06/10/13 0755 Last data filed at 06/10/13 0600  Gross per 24 hour  Intake    580 ml  Output   1600 ml  Net  -1020 ml   Filed Weights   06/06/13 0713 06/06/13 1204  Weight: 300 lb (136.079 kg) 309 lb 8.4 oz (140.4 kg)    PHYSICAL EXAM  General: Pleasant, NAD. Neuro: Alert and oriented X 3. Moves all extremities spontaneously. Psych: Normal affect. HEENT:   Normal  Neck: Supple without bruits or JVD. Lungs:  Resp regular and unlabored, no wheezing, mild crackles at the basis Heart: RRR no s3, s4, or murmurs. Abdomen: Soft, non-tender, non-distended, BS + x 4.  Extremities: No clubbing, cyanosis or edema. DP/PT/Radials 2+ and equal bilaterally.  Accessory Clinical Findings  CBC  Recent Labs  06/09/13 0405 06/10/13 0343  WBC 12.0* 11.7*  HGB 12.0 12.4  HCT 35.9* 36.4  MCV 75.6* 75.8*  PLT 194 183   Basic Metabolic Panel  Recent Labs  06/09/13 1634 06/10/13 0343  NA 136 136  K 5.1 5.4*  CL 103 103  CO2 23 25  GLUCOSE 151* 151*  BUN 57* 61*  CREATININE 2.22* 2.06*  CALCIUM 8.9 9.0    TELE  AV paced rhythm, 85-95 BPM  Radiology/Studies  06/06/2013 18:50   Dg Chest Port 1 View  06/08/2013   CLINICAL DATA:  Assess edema. COPD.  EXAM: PORTABLE CHEST - 1 VIEW  COMPARISON:  The 06/07/2013.  FINDINGS: AICD sequential pacemaker enters from the left with leads unchanged in position.  Cardiomegaly.  Pulmonary vascular congestion.  Slight improved aeration left base. Minimal blunting left costophrenic angle remains. Small pleural effusion  not excluded.  IMPRESSION: Improved aeration left lung base. Please see above.     ASSESSMENT AND PLAN  62 year old female   1. Respiratory failure secondary to B/L pneumonia and COPD exacerbation - on iv steroids and antibiotics, significantly improved, off BiPAP since Friday night, mental status improved, no active wheezing.  Needs CPAP or Bipap arranged for home  2. 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 Abnormal wall motion on the echo might be possibly explained by BiV pacing. Troponins are negative and the patient is asymptomatic, there is no reason to proceed with further ischemia work up at this point.  I/O negative without diuretic today   No diuretic today given continued elevation in Cr and soft BP  Check BNP  3. ARF - follow   Improved this am   4. Morbid obesity  Etiology of all her problems   Signed, Charlton Haws MD

## 2013-06-10 NOTE — Progress Notes (Signed)
CARE MANAGEMENT NOTE 06/10/2013  Patient:  Monique Kane, Monique Kane   Account Number:  [id number]  Date Initiated:  06/07/2013  Documentation initiated by:  [facility]  Subjective/Objective Assessment:   62 year old female admitted with acute on chronic respiratory failure.     Action/Plan:   From home.   Anticipated DC Date:  06/13/2013   Anticipated DC Plan:  HOME/SELF CARE      DC Planning Services  CM consult      Choice offered to / List presented to:             Status of service:  In process, will continue to follow Medicare Important Message given?  NA - LOS <3 / Initial given by admissions (If response is "NO", the following Medicare IM given date fields will be blank) Date Medicare IM given:   Date Additional Medicare IM given:    Discharge Disposition:    Per UR Regulation:  Reviewed for med. necessity/level of care/duration of stay  If discussed at Long Length of Stay Meetings, dates discussed:    Comments:  [id number]/Rhonda Stark Jock, BSN, [place] [phone] Chart Reviewed for discharge and hospital needs. Patient with multiple copmplicating health problems, pna, renal insufficieny,copd and deconditioned. Discharge needs at time of review:  following-md noted do mention that patient does have pumon Review of patient progress due on [id number].  Transferred to med floor [id number].

## 2013-06-10 NOTE — Progress Notes (Signed)
ANTIBIOTIC CONSULT NOTE - Follow up  Pharmacy Consult for Levaquin Indication: pneumonia  No Known Allergies  Patient Measurements: Height: 5\' 3"  (160 cm) Weight: 309 lb 8.4 oz (140.4 kg) IBW/kg (Calculated) : 52.4  Vital Signs: Temp: 97.6 F (36.4 C) (10/14 0800) Temp src: Oral (10/14 0800) BP: 116/65 mmHg (10/14 1000) Pulse Rate: 89 (10/14 1000) Intake/Output from previous day: 10/13 0701 - 10/14 0700 In: 580 [P.O.:360; I.V.:220] Out: 1600 [Urine:1600]  Labs:  Recent Labs  06/08/13 0400 06/08/13 1517 06/09/13 0405 06/09/13 1634 06/10/13 0343 06/10/13 0805  WBC 12.2*  --  12.0*  --  11.7*  --   HGB 12.4  --  12.0  --  12.4  --   PLT 191  --  194  --  183  --   LABCREA  --  168.0  --   --   --   --   CREATININE 2.03*  --  2.34* 2.22* 2.06* 1.96*   Estimated Creatinine Clearance: 41.7 ml/min (by C-G formula based on Cr of 1.96).   Microbiology: 10/10 blood x2: ngtd, collected prior to abx administration 10/10 MRSA PCR: negative 10/10 Ur Legionella Ag: negative 10/10 S pneumo Ur Ag: negative 10/10 HIV Ab: non reactive 10/10 flu pcr: negative 10/10 resp virus panel: all negative  Anti-infectives: 10/10 >> Vanc >> 10/12 10/10 >> Cefepime >> 10/12 10/10 >> Azithromycin (MD) >> 10/11 10/11 >> Levaquin >>  Assessment: 62 y.o. female presenting with wheezing, shortness of breath, and fell on the way to ED on 10/10. CXR showed new L>R, basilar interstitial and airspace opacities. This may reflect atelectasis, infiltrate/pneumonia or combination. Vancomycin and Cefepime ordered per Pharmacy protocol on 10/10, adding Levaquin today for atypical coverage.  Antibiotics changed to PO Levaquin alone on 10/12.  D4 Levaquin (D5 total abx) for HCAP   Weight: increased to 140.4 kg (previously ~ 136kg)  SCr 1.96, CrCl ~ 42 ml/min CG, but using TBW, CrCl ~ 61 ml/min  WBC slightly elevated, but improving on steroid taper  Goal of Therapy:  Levaquin dose per renal  function  Plan:   Increase to Levaquin 750mg  PO q24h Follow up renal function & cultures, de-escalation of therapy   Lynann Beaver PharmD, BCPS Pager [phone] 06/10/2013 12:22 PM

## 2013-06-10 NOTE — Progress Notes (Signed)
OT Cancellation Note  Patient Details Name: Isis Costanza MRN: [id number] DOB: 04-19-1951   Cancelled Treatment:    Reason Eval/Treat Not Completed: Other (comment)  Pt was eval and dc'd from OT on Saturday.  Checked with RN.  No change in status.  Will sign off.    Kyron Schlitt 06/10/2013, 7:57 AM Marica Otter, OTR/L [phone] 06/10/2013

## 2013-06-10 NOTE — Progress Notes (Signed)
Recd pt from ICU, agree w/prior assessment, see changes as charted. Pt up in chair, no c/o.

## 2013-06-11 ENCOUNTER — Encounter (HOSPITAL_COMMUNITY): Payer: Self-pay | Admitting: Radiology

## 2013-06-11 ENCOUNTER — Inpatient Hospital Stay (HOSPITAL_COMMUNITY): Payer: Medicaid Other

## 2013-06-11 LAB — GLUCOSE, CAPILLARY
Glucose-Capillary: 154 mg/dL — ABNORMAL HIGH (ref 70–99)
Glucose-Capillary: 109 mg/dL — ABNORMAL HIGH (ref 70–99)
Glucose-Capillary: 143 mg/dL — ABNORMAL HIGH (ref 70–99)
Glucose-Capillary: 135 mg/dL — ABNORMAL HIGH (ref 70–99)

## 2013-06-11 LAB — BASIC METABOLIC PANEL
BUN: 54 mg/dL — ABNORMAL HIGH (ref 6–23)
Chloride: 104 mEq/L (ref 96–112)
GFR calc non Af Amer: 29 mL/min — ABNORMAL LOW (ref >90)
Glucose, Bld: 105 mg/dL — ABNORMAL HIGH (ref 70–99)
Potassium: 4.8 mEq/L (ref 3.5–5.1)

## 2013-06-11 LAB — PRO B NATRIURETIC PEPTIDE: Pro B Natriuretic peptide (BNP): 267 pg/mL — ABNORMAL HIGH (ref 0–125)

## 2013-06-11 LAB — CBC
HCT: 42.7 % (ref 36.0–46.0)
Hemoglobin: 14.3 g/dL (ref 12.0–15.0)
MCH: 25.6 pg — ABNORMAL LOW (ref 26.0–34.0)
MCHC: 33.5 g/dL (ref 30.0–36.0)
MCV: 76.4 fL — ABNORMAL LOW (ref 78.0–100.0)
RDW: 17.9 % — ABNORMAL HIGH (ref 11.5–15.5)

## 2013-06-11 MED ORDER — METOPROLOL SUCCINATE ER 50 MG PO TB24
50.0000 mg | ORAL_TABLET | Freq: Every day | ORAL | Status: DC
  Administered 2013-06-11 – 2013-06-12 (×2): 50 mg via ORAL
  Filled 2013-06-11 (×2): qty 1

## 2013-06-11 MED ORDER — TORSEMIDE 20 MG PO TABS
20.0000 mg | ORAL_TABLET | Freq: Every day | ORAL | Status: DC
  Administered 2013-06-11 – 2013-06-12 (×2): 20 mg via ORAL
  Filled 2013-06-11 (×2): qty 1

## 2013-06-11 NOTE — Progress Notes (Signed)
Patient ID: Monique Kane, female   DOB: 1950/11/23, 62 y.o.   MRN: [id number]   Patient Name: Monique Kane Date of Encounter: 06/11/2013    Principal Problem:   Acute-on-chronic respiratory failure Active Problems:   Dilated cardiomyopathy   COPD (chronic obstructive pulmonary disease)   Hypertension   Cardiac defibrillator in place   Morbid obesity   HCAP (healthcare-associated pneumonia)   Tachycardia   COPD with acute exacerbation/ Early exac   Chronic renal insufficiency   Fall at home   Unspecified constipation/ chronic   Acute encephalopathy   Abnormal echocardiogram    SUBJECTIVE  No complaints sitting in chair Breathing at baseline   CURRENT MEDS . allopurinol  100 mg Oral Daily  . antiseptic oral rinse  15 mL Mouth Rinse q12n4p  . aspirin EC  81 mg Oral Daily  . chlorhexidine  15 mL Mouth Rinse BID  . heparin  5,000 Units Subcutaneous Q8H  . insulin aspart  0-9 Units Subcutaneous TID WC  . levofloxacin  750 mg Oral Daily  . metoprolol succinate  50 mg Oral Daily  . pantoprazole  40 mg Oral Daily  . polyethylene glycol  17 g Oral BID  . predniSONE  20 mg Oral Q breakfast  . senna-docusate  1 tablet Oral QHS  . simvastatin  10 mg Oral q1800  . sodium chloride  3 mL Intravenous Q12H    OBJECTIVE  Filed Vitals:   06/10/13 1000 06/10/13 1200 06/10/13 2200 06/11/13 0632  BP: 116/65 147/84 109/65 143/90  Pulse: 89 88 88 101  Temp:  97.6 F (36.4 C) 98.3 F (36.8 C) 97.5 F (36.4 C)  TempSrc:  Oral Oral Oral  Resp: 12 16 18 16   Height:      Weight:      SpO2: 98% 100% 100% 99%    Intake/Output Summary (Last 24 hours) at 06/11/13 1321 Last data filed at 06/11/13 1300  Gross per 24 hour  Intake   1080 ml  Output      0 ml  Net   1080 ml   Filed Weights   06/06/13 0713 06/06/13 1204  Weight: 300 lb (136.079 kg) 309 lb 8.4 oz (140.4 kg)    PHYSICAL EXAM  General: Pleasant, NAD. Morbidly obese  Neuro: Alert and oriented X 3. Moves all  extremities spontaneously. Psych: Normal affect. HEENT:  Normal  Neck: Supple without bruits or JVD. Lungs:  Resp regular and unlabored, no wheezing, mild crackles at the basis Heart: RRR no s3, s4, or murmurs. Abdomen: Soft, non-tender, non-distended, BS + x 4.  Extremities: No clubbing, cyanosis Plus one bilateral  edema. DP/PT/Radials 2+ and equal bilaterally.  Accessory Clinical Findings  CBC  Recent Labs  06/10/13 0343 06/11/13 0619  WBC 11.7* 13.8*  HGB 12.4 14.3  HCT 36.4 42.7  MCV 75.8* 76.4*  PLT 183 177   Basic Metabolic Panel  Recent Labs  06/10/13 0805 06/11/13 0619  NA 136 138  K 4.7 4.8  CL 102 104  CO2 26 25  GLUCOSE 143* 105*  BUN 58* 54*  CREATININE 1.96* 1.81*  CALCIUM 8.8 9.3    TELE  AV paced rhythm, 85-95 BPM  Radiology/Studies  06/06/2013 18:50   Dg Chest Port 1 View  06/08/2013   CLINICAL DATA:  Assess edema. COPD.  EXAM: PORTABLE CHEST - 1 VIEW  COMPARISON:  The 06/07/2013.  FINDINGS: AICD sequential pacemaker enters from the left with leads unchanged in position.  Cardiomegaly.  Pulmonary vascular congestion.  Slight improved aeration left base. Minimal blunting left costophrenic angle remains. Small pleural effusion not excluded.  IMPRESSION: Improved aeration left lung base. Please see above.     ASSESSMENT AND PLAN  62 year old female   1. Respiratory failure secondary to B/L pneumonia and COPD exacerbation - on iv steroids and antibiotics, significantly improved, off BiPAP since Friday night, mental status improved, no active wheezing.  Needs CPAP or Bipap arranged for home  2. 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 Abnormal wall motion on the echo might be possibly explained by BiV pacing. Troponins are negative and the patient is asymptomatic, there is no reason to proceed with further ischemia work up at this point.    BNP is relatively low and Cr is improved resume home  dose of demedex but not aldactone given propensity of elevated Cr  3. ARF - follow  Improved this am   4. Morbid obesity  Etiology of all her problems   Signed, Charlton Haws MD

## 2013-06-11 NOTE — Progress Notes (Addendum)
[facility] PROGRESS NOTE  Kayelyn Lemon WUJ:[id number] DOB: 1950/10/04 DOA: 06/06/2013 PCP: Iona Hansen, NP  Assessment/Plan: #1 acute on chronic hypoxic respiratory failure Questionable etiology. Likely multifactorial secondary to a healthcare associated pneumonia, probable obesity hypoventilation syndrome/OSA, acute COPD exacerbation and left-sided effusion noted on chest x-ray. Clinical improvement on BiPAP. Influenza PCR panel is negative. Multiplex resp viral panel neg. Sputum Gram stain and cultures pending. Blood cultures = negative so far Urine Legionella negative, urine pneumococcus antigen is negative. Continue Levaquin to complete 8 day course. Continue steriod taper to 20 mg prednisone, Lopressor 50 mg daily, nebs.  #2 healthcare associated pneumonia Patient states was recently admitted at [place] one month prior to admission. Chest x-ray on presentation was worrisome for pneumonia. Repeat chest x-ray 10/15 however shows no pneumonia Continue oral Levaquin to complete 8 day course on 10/17, nebulizer treatments.  #3 acute encephalopathy Clinical improvement at baseline. Likely multifactorial secondary to obesity hypoventilation syndrome/OSA, healthcare associated pneumonia, acute COPD exacerbation. CT head negative. Ammonia level is normal. Patient was placed on the BiPAP with  clinical improvement. Likely needs home cpap vs bipap. Needs to f/u with her pulm in [place] on d/c Will arrange DME with BIPAP at home.  #4 acute kidney injury Baseline creatinine about 1.5. Patient was on diuretics prior to admission. Patient was given a low bolus of IV fluids secondary to borderline blood pressure issues and she is KVO. Creatinine starting to trend back down. Patient autodiuresing with uop 1600/24hrs and neg 1020.  Urine sodium is 10. BP has been borderline. Renal US neg for hydronephrosis. Continue to hold ARB and diuretics.   #5 acute COPD exacerbation Likely triggered  by a healthcare associated pneumonia. Clinical improvement. Minimal wheezing noted on exam however patient is obese. Continue steroid taper, oral levaquin to complete 8 day course, nebulizer treatments, oxygen.  #6  abnormal 2-D echo/history of coronary artery disease/dilated cardiomyopathy/history of CHF/history of PPM  Patient denies any chest pain. However patient presented with worsening shortness of breath and wheezing.  Cardiac enzymes are negative x2. 2-D echo is however abnormal with EF of 40% and wall motion abnormalities in the basal to mid inferoseptal and anteroseptal dyskinesias. Continue beta blocker. Diuretics on hold secondary to problem #4 and borderline BP.. I/O= + 600 mcc today-aim to keep equal.  Cardiology ff and appreciate input and rxcs, Continue to hold diuretics. BNP done on 10/15 was marginal at 267-suspect this is  #7 tachycardia Likely secondary to acute illness. Heart rate has improved. Patient was restarted on  Beta blocker. Continue treatment for healthcare associated pneumonia, acute COPD exacerbation. Follow.  #8 left pleural effusion Noted on chest x-ray. Pulmonary consult appreciated  #9 Fall Mechanical in nature. PT/OT.  #10 chronic constipation Continue current bowel regimen.  #11 Morbid obesity  #12 prophylaxis PPI for GI prophylaxis. Heparin for DVT prophylaxis.   Code Status: Full Family Communication: Updated patient at bedside. Disposition Plan: Transfer to telemetry.   Consultants:  PCCM: Dr. Marchelle Gearing 06/06/2013  Cardiology: Dr Delton See 06/07/13  Procedures:  CT head 06/06/2013  2-D echo 06/06/2013  Chest x-ray 06/06/2013  Antibiotics:  IV vancomycin 06/06/2013----> 06/08/13  IV cefepime 06/06/2013--->06/08/13  IV azithromycin 06/06/2013---> 06/07/13  IV Levaquin 06/07/13--->06/09/13  Oral levaquin 06/09/13  HPI/Subjective: Patient alert, following commands, answering questions appropriately. Doing fair. States that she  uses chronic oxygen for the past one to one and half years even in light. Has never been told she needs CPAP according to her her recollection.  No chest pain or shortness breath no nausea no vomiting no other issues .   Objective: Filed Vitals:   06/11/13 1542  BP: 116/65  Pulse: 88  Temp: 98.1 F (36.7 C)  Resp: 16    Intake/Output Summary (Last 24 hours) at 06/11/13 1812 Last data filed at 06/11/13 1300  Gross per 24 hour  Intake    840 ml  Output      0 ml  Net    840 ml   Filed Weights   06/06/13 0713 06/06/13 1204  Weight: 136.079 kg (300 lb) 140.4 kg (309 lb 8.4 oz)    Exam:   General:  Obese, alert, BIPAP off. On nebs  Cardiovascular: Distant heart sounds. RR  Respiratory: Bibasilar crackles.  Abdomen: Soft/NT/ND/+BS  Musculoskeletal: No c/c/e  Data Reviewed: Basic Metabolic Panel:  Recent Labs Lab 06/06/13 0737 06/06/13 1230 06/07/13 0730  06/09/13 0405 06/09/13 1634 06/10/13 0343 06/10/13 0805 06/11/13 0619  NA 142  --  138  < > 135 136 136 136 138  K 4.4  --  5.1  < > 5.1 5.1 5.4* 4.7 4.8  CL 102  --  105  < > 100 103 103 102 104  CO2 27  --  23  < > 23 23 25 26 25   GLUCOSE 156*  --  167*  < > 177* 151* 151* 143* 105*  BUN 21  --  29*  < > 59* 57* 61* 58* 54*  CREATININE 1.81* 1.90* 1.80*  < > 2.34* 2.22* 2.06* 1.96* 1.81*  CALCIUM 9.1  --  8.7  < > 9.1 8.9 9.0 8.8 9.3  MG  --  1.8 1.9  --   --   --   --   --   --   PHOS  --   --  4.1  --   --   --   --   --   --   < > = values in this interval not displayed. Liver Function Tests:  Recent Labs Lab 06/06/13 1533  AST 19  ALT 19  ALKPHOS 90  BILITOT 0.3  PROT 7.2  ALBUMIN 3.0*   No results found for this basename: LIPASE, AMYLASE,  in the last 168 hours  Recent Labs Lab 06/06/13 1832  AMMONIA 26   CBC:  Recent Labs Lab 06/06/13 0737  06/07/13 0730 06/08/13 0400 06/09/13 0405 06/10/13 0343 06/11/13 0619  WBC 10.6*  < > 10.5 12.2* 12.0* 11.7* 13.8*  NEUTROABS 5.0  --   9.0*  --   --   --   --   HGB 13.2  < > 12.1 12.4 12.0 12.4 14.3  HCT 40.5  < > 38.1 36.7 35.9* 36.4 42.7  MCV 77.7*  < > 78.4 77.6* 75.6* 75.8* 76.4*  PLT 182  < > 212 191 194 183 177  < > = values in this interval not displayed. Cardiac Enzymes:  Recent Labs Lab 06/06/13 1533 06/06/13 2258 06/07/13 0730  TROPONINI <0.30 <0.30 <0.30   BNP (last 3 results)  Recent Labs  06/09/13 1635 06/10/13 0343 06/11/13 0620  PROBNP 240.9* 201.0* 267.0*   CBG:  Recent Labs Lab 06/10/13 1240 06/10/13 1641 06/10/13 2126 06/11/13 0816 06/11/13 1235  GLUCAP 117* 186* 123* 154* 109*    Recent Results (from the past 240 hour(s))  CULTURE, BLOOD (ROUTINE X 2)     Status: None   Collection Time    06/06/13  8:42 AM  Result Value Range Status   Specimen Description BLOOD RIGHT FOREARM   Final   Special Requests BOTTLES DRAWN AEROBIC AND ANAEROBIC   Final   Culture  Setup Time     Final   Value: 06/06/2013 13:10     Performed at [facility]   Culture     Final   Value:        BLOOD CULTURE RECEIVED NO GROWTH TO DATE CULTURE WILL BE HELD FOR 5 DAYS BEFORE ISSUING A FINAL NEGATIVE REPORT     Performed at [facility]   Report Status PENDING   Incomplete  CULTURE, BLOOD (ROUTINE X 2)     Status: None   Collection Time    06/06/13  8:52 AM      Result Value Range Status   Specimen Description BLOOD LEFT FOREARM   Final   Special Requests BOTTLES DRAWN AEROBIC AND ANAEROBIC   Final   Culture  Setup Time     Final   Value: 06/06/2013 13:11     Performed at [facility]   Culture     Final   Value:        BLOOD CULTURE RECEIVED NO GROWTH TO DATE CULTURE WILL BE HELD FOR 5 DAYS BEFORE ISSUING A FINAL NEGATIVE REPORT     Performed at [facility]   Report Status PENDING   Incomplete  MRSA PCR SCREENING     Status: None   Collection Time    06/06/13 11:31 AM      Result Value Range Status   MRSA by PCR NEGATIVE  NEGATIVE Final   Comment:             The GeneXpert MRSA Assay (FDA     approved for NASAL specimens     only), is one component of a     comprehensive MRSA colonization     surveillance program. It is not     intended to diagnose MRSA     infection nor to guide or     monitor treatment for     MRSA infections.  RESPIRATORY VIRUS PANEL     Status: None   Collection Time    06/06/13  3:01 PM      Result Value Range Status   Source - RVPAN NASOPHARYNGEAL   Final   Respiratory Syncytial Virus A NOT DETECTED   Final   Respiratory Syncytial Virus B NOT DETECTED   Final   Influenza A NOT DETECTED   Final   Influenza B NOT DETECTED   Final   Parainfluenza 1 NOT DETECTED   Final   Parainfluenza 2 NOT DETECTED   Final   Parainfluenza 3 NOT DETECTED   Final   Metapneumovirus NOT DETECTED   Final   Rhinovirus NOT DETECTED   Final   Adenovirus NOT DETECTED   Final   Influenza A H1 NOT DETECTED   Final   Influenza A H3 NOT DETECTED   Final   Comment: (NOTE)           Normal Reference Range for each Analyte: NOT DETECTED     Testing performed using the Luminex xTAG Respiratory Viral Panel test     kit.     This test was developed and its performance characteristics determined     by [facility]. It has not been cleared or approved by the [place]     Food and Drug Administration. This test is used for clinical  purposes.     It should not be regarded as investigational or for research. This     laboratory is certified under the Clinical Laboratory Improvement     Amendments of 1988 (CLIA) as qualified to perform high complexity     clinical laboratory testing.     Performed at [facility]     Studies: Dg Chest 2 View  06/11/2013   CLINICAL DATA:  Evaluate for progression of pneumonia.  EXAM: CHEST  2 VIEW  COMPARISON:  Chest x-ray 06/08/2013.  FINDINGS: Opacity in the region of the lingula, favored to predominantly reflect subsegmental atelectasis. No definite consolidative airspace disease. No pleural  effusions. Cephalization of the pulmonary vasculature, without frank pulmonary edema. Mild cardiomegaly is unchanged. Upper mediastinal contours are within normal limits. Left-sided pacemaker/ AICD with lead tips projecting over the expected location of the right atrial appendage, right ventricular apex and right ventricular outflow tract.  IMPRESSION: 1. Probable subsegmental atelectasis or scarring in the lingula. No definite consolidative airspace disease to suggest pneumonia at this time. 2. Mild cardiomegaly with pulmonary venous congestion, but no frank pulmonary edema.   Electronically Signed   By: Trudie Reed M.D.   On: 06/11/2013 10:55    Scheduled Meds: . allopurinol  100 mg Oral Daily  . antiseptic oral rinse  15 mL Mouth Rinse q12n4p  . aspirin EC  81 mg Oral Daily  . chlorhexidine  15 mL Mouth Rinse BID  . heparin  5,000 Units Subcutaneous Q8H  . insulin aspart  0-9 Units Subcutaneous TID WC  . levofloxacin  750 mg Oral Daily  . metoprolol succinate  50 mg Oral Daily  . pantoprazole  40 mg Oral Daily  . polyethylene glycol  17 g Oral BID  . predniSONE  20 mg Oral Q breakfast  . senna-docusate  1 tablet Oral QHS  . simvastatin  10 mg Oral q1800  . sodium chloride  3 mL Intravenous Q12H  . torsemide  20 mg Oral Daily   Continuous Infusions:    Principal Problem:   Acute-on-chronic respiratory failure Active Problems:   Dilated cardiomyopathy   COPD (chronic obstructive pulmonary disease)   Hypertension   Cardiac defibrillator in place   Morbid obesity   HCAP (healthcare-associated pneumonia)   Tachycardia   COPD with acute exacerbation/ Early exac   Chronic renal insufficiency   Fall at home   Unspecified constipation/ chronic   Acute encephalopathy   Abnormal echocardiogram    Time spent: > 35 mins    Rhetta Mura  [facility] Pager [phone]. If 7PM-7AM, please contact night-coverage at [email or web address], password [facility] 06/11/2013, 6:12 PM   LOS: 5 days

## 2013-06-12 LAB — CULTURE, BLOOD (ROUTINE X 2)
Culture: NO GROWTH
Culture: NO GROWTH

## 2013-06-12 LAB — BASIC METABOLIC PANEL
BUN: 55 mg/dL — ABNORMAL HIGH (ref 6–23)
Chloride: 104 mEq/L (ref 96–112)
Creatinine, Ser: 2.09 mg/dL — ABNORMAL HIGH (ref 0.50–1.10)
GFR calc Af Amer: 28 mL/min — ABNORMAL LOW (ref >90)
Glucose, Bld: 84 mg/dL (ref 70–99)

## 2013-06-12 LAB — GLUCOSE, CAPILLARY
Glucose-Capillary: 92 mg/dL (ref 70–99)
Glucose-Capillary: 137 mg/dL — ABNORMAL HIGH (ref 70–99)

## 2013-06-12 MED ORDER — LEVOFLOXACIN 750 MG PO TABS: 750.0000 mg | ORAL_TABLET | ORAL | Status: DC

## 2013-06-12 MED ORDER — METOPROLOL SUCCINATE ER 50 MG PO TB24: 50.0000 mg | ORAL_TABLET | Freq: Every day | ORAL | Status: DC

## 2013-06-12 MED ORDER — TORSEMIDE 20 MG PO TABS: 20.0000 mg | ORAL_TABLET | Freq: Every day | ORAL | Status: DC

## 2013-06-12 MED ORDER — PREDNISONE 20 MG PO TABS: 20.0000 mg | ORAL_TABLET | Freq: Every day | ORAL | Status: DC

## 2013-06-12 NOTE — Care Management Note (Addendum)
    Page 1 of 2   06/12/2013     1:21:48 PM   CARE MANAGEMENT NOTE 06/12/2013  Patient:  Monique Kane, Monique Kane   Account Number:  [id number]  Date Initiated:  06/07/2013  Documentation initiated by:  [facility]  Subjective/Objective Assessment:   62 year old female admitted with acute on chronic respiratory failure.     Action/Plan:   From home.   Anticipated DC Date:  06/12/2013   Anticipated DC Plan:  HOME/SELF CARE      DC Planning Services  CM consult      Choice offered to / List presented to:             Status of service:  Completed, signed off Medicare Important Message given?  NA - LOS <3 / Initial given by admissions (If response is "NO", the following Medicare IM given date fields will be blank) Date Medicare IM given:   Date Additional Medicare IM given:    Discharge Disposition:  HOME/SELF CARE  Per UR Regulation:  Reviewed for med. necessity/level of care/duration of stay  If discussed at Long Length of Stay Meetings, dates discussed:   06/12/2013    Comments:  06/12/13 [name] RN,BSN NCM [phone] HOME 02 FROM [facility].PATIENT KNOWS TO CALL THEM IF SUPPLIES NEEDED.NOTED CPAP FOR HOME RECOMMENDED.SPOKE TO PATIENT ABOUT PROCESS(CPAP-AN OUTPATIENT SERVICE) PER [facility] SINCE SHE HAD SLEEP STUDY DONE IN 2007,WOULD NEED A NEW SLEEP STUDY,SHE FOLLOWS W/PULMONOLOGIST IN [place]-INFORMED HER TO MAKE SURE THEY SCHEDULE A SLEEP STUDY WITHIN 2 WEEKS,THEN DME COMPANY CAN F/U WITH HER PCP,PROCESS FOR CPAP IF QUALIFIES.PATIENT VOICED UNDERSTANDING.UPDATED DR. Mahala Menghini WHO WILL CANCEL HOME CPAP-SINCE CURRENT SLEEP STUDY IS OUTDATED,& WOULD NEED A NEW SLEEP STUDY(OUTPATIENT SERVICE TO BE DONE THROUGH HER PULMONOLOGIST).  [id number] Earlene Plater, RN, BSN, [place] [phone] Chart Reviewed for discharge and hospital needs. Patient with multiple copmplicating health problems, pna, renal insufficieny,copd and deconditioned. Discharge needs at time of review:  following-md noted do  mention that patient does have pumon Review of patient progress due on [id number].

## 2013-06-12 NOTE — Discharge Summary (Signed)
Physician Discharge Summary  Monique Kane ZOX:[id number] DOB: Aug 25, 1951 DOA: 06/06/2013  PCP: Iona Hansen, NP  Admit date: 06/06/2013 Discharge date: 06/12/2013  Time spent: 35 minutes  Recommendations for Outpatient Follow-up:  1. Patient needs lab work including chem 12, CBC 1 week 2. Patient needs weighing scale and also up titration of potentially both her beta blocker as well as diuretic if gains weight  3. Consider reimplantation at a later date of her ARB 4. Patient will need to see nutritionist and needs to lose weight-this is a life limiting process 5. Patient is to followup with her pulmonologist to discuss CPAP/BiPAP settings as an outpatient-this will be provided to her in the hospital 6. Patient is to continue steroids at prednisone 20 mg for 5 more days which can be extended if needed per primary care physician  Discharge Diagnoses:  Principal Problem:   Acute-on-chronic respiratory failure Active Problems:   Dilated cardiomyopathy   COPD (chronic obstructive pulmonary disease)   Hypertension   Cardiac defibrillator in place   Morbid obesity   HCAP (healthcare-associated pneumonia)   Tachycardia   COPD with acute exacerbation/ Early exac   Chronic renal insufficiency   Fall at home   Unspecified constipation/ chronic   Acute encephalopathy   Abnormal echocardiogram   Discharge Condition: Good  Diet recommendation: Heart healthy low-salt  Filed Weights   06/06/13 0713 06/06/13 1204 06/12/13 0530  Weight: 136.079 kg (300 lb) 140.4 kg (309 lb 8.4 oz) 144.5 kg (318 lb 9 oz)    History of present illness:  62 year old ? admitted 06/06/2013 with 2 day history of worsening shortness breath, fever, chills-she has 5 grandchildren at home, some of whom were sick recently and thinks she may of gotten an infection from then. She saw her PCP one day prior to admission and was given penicillin. She was admitted with multifactorial dyspnea as per  below    Hospital Course:  #1 acute on chronic hypoxic respiratory failure  Questionable etiology. Likely multifactorial secondary to a healthcare associated pneumonia, probable obesity hypoventilation syndrome/OSA, acute COPD exacerbation and left-sided effusion noted on chest x-ray. Clinical improvement on BiPAP. Influenza PCR panel is negative. Multiplex resp viral panel neg. Sputum Gram stain and cultures pending. Blood cultures = negative so far Urine Legionella negative, urine pneumococcus antigen is negative. Continue Levaquin to complete 8 day course, 06/13/13. Continue steriod taper to 20 mg prednisone for 5 days ending 06/16/13 #2 Sepsis 2/2 to Health care assosciated pneumonia Patient states was recently admitted at [place] one month prior to admission. Chest x-ray on presentation was worrisome for pneumonia. Repeat chest x-ray 10/15 however shows no pneumonia Continue oral Levaquin to complete 8 day course on 10/17, continue Inhalers at home for now until seen by PCP #3 acute encephalopathy  Clinical improvement at baseline. Likely multifactorial secondary to obesity hypoventilation syndrome/OSA, healthcare associated pneumonia, acute COPD exacerbation. CT head negative. Ammonia level is normal. Patient was placed on the BiPAP with clinical improvement.  Likely needs home cpap vs bipap. Needs to f/u with her pulm in [place] on d/c  Will arrange DME with BIPAP at home.  #4 acute kidney injury  Baseline creatinine about 1.5. Patient was on diuretics prior to admission. Patient was given a low bolus of IV fluids secondary to borderline blood pressure issues and she is KVO. Creatinine starting to trend back down. Patient autodiuresing with uop 1600/24hrs and neg 1020. Urine sodium is 10. BP has been borderline. Renal US neg for hydronephrosis.  Continue to hold ARB and diuretics until reviewed by outpatient physician and pulmonologist #5 acute COPD exacerbation  Likely triggered by a  healthcare associated pneumonia. Clinical improvement. Minimal wheezing noted on exam however patient is obese. Continue steroid taper, oral levaquin to complete 8 day course, nebulizer treatments, oxygen.  #6 abnormal 2-D echo/history of coronary artery disease/dilated cardiomyopathy/history of CHF/history of PPM  Patient denies any chest pain. However patient presented with worsening shortness of breath and wheezing. Cardiac enzymes are negative x2. 2-D echo is however abnormal with EF of 40% and wall motion abnormalities in the basal to mid inferoseptal and anteroseptal dyskinesias. Continue beta blocker. Diuretics on hold secondary to problem #4 and borderline BP.. I/O= + 600 mcc today-aim to keep equal. Cardiology ff and appreciate input and rxcs,  Continue to hold diuretics.  BNP done on 10/15 was marginal at 267 Lopressor simplified to 50 mg XLdaily #7 tachycardia  Likely secondary to acute illness. Heart rate has improved. Patient was restarted on Beta blocker as above. Continue treatment for healthcare associated pneumonia, acute COPD exacerbation. Follow.  #8 left pleural effusion  Noted on chest x-ray. Pulmonary consult appreciated  #9 Fall  Mechanical in nature. PT/OT.  #10 chronic constipation  Continue current bowel regimen.  #11 Morbid obesity  #12 prophylaxis  PPI for GI prophylaxis. Heparin for DVT prophylaxis.     Consultants:  PCCM: Dr. Marchelle Gearing 06/06/2013  Cardiology: Dr Delton See 06/07/13 Procedures:  CT head 06/06/2013  2-D echo 06/06/2013  Chest x-ray 06/06/2013 Antibiotics:  IV vancomycin 06/06/2013----> 06/08/13  IV cefepime 06/06/2013--->06/08/13  IV azithromycin 06/06/2013---> 06/07/13  IV Levaquin 06/07/13--->06/09/13  Oral levaquin 06/09/13---> 10/17  Discharge Exam: Filed Vitals:   06/12/13 1008  BP: 114/70  Pulse: 90  Temp:   Resp:     General: Alert pleasant oriented no overt distress Cardiovascular: S1-S2 no murmur rub or gallop Respiratory:  Clinically clear no added sound  Discharge Instructions  Discharge Orders   Future Orders Complete By Expires   Diet - low sodium heart healthy  As directed    Discharge instructions  As directed    Comments:     Please get a weighing scale and weigh yourself.  If you gain above 2 pounds in 24 hours, take an extra Demadex tablet Please get blood work done in about one week at your primary care physician's office I am setting you up with a CPAP machine-please make sure you follow up with her pulmonologist Dr. Loraine Leriche Donor who prescribed the chronic oxygen Continue regular oxygen  Please consider speaking to her primary care physician about nutritional consult to help you manage weight   Increase activity slowly  As directed        Medication List    STOP taking these medications       amoxicillin-clavulanate 875-125 MG per tablet  Commonly known as:  AUGMENTIN     spironolactone 50 MG tablet  Commonly known as:  ALDACTONE     valsartan 160 MG tablet  Commonly known as:  DIOVAN      TAKE these medications       allopurinol 100 MG tablet  Commonly known as:  ZYLOPRIM  Take 100 mg by mouth every morning.     aspirin EC 81 MG tablet  Take 81 mg by mouth every morning.     levalbuterol 45 MCG/ACT inhaler  Commonly known as:  XOPENEX HFA  Inhale 1-2 puffs into the lungs every 4 (four) hours as needed. For shortness of  breath     levofloxacin 750 MG tablet  Commonly known as:  LEVAQUIN  Take 1 tablet (750 mg total) by mouth every other day.  Start taking on:  06/13/2013     metoprolol succinate 50 MG 24 hr tablet  Commonly known as:  TOPROL-XL  Take 1 tablet (50 mg total) by mouth daily. Take with or immediately following a meal.     omeprazole 20 MG capsule  Commonly known as:  PRILOSEC  Take 20 mg by mouth daily.     pravastatin 20 MG tablet  Commonly known as:  PRAVACHOL  Take 20 mg by mouth daily.     predniSONE 20 MG tablet  Commonly known as:  DELTASONE   Take 1 tablet (20 mg total) by mouth daily with breakfast.     torsemide 20 MG tablet  Commonly known as:  DEMADEX  Take 1 tablet (20 mg total) by mouth daily.       No Known Allergies    The results of significant diagnostics from this hospitalization (including imaging, microbiology, ancillary and laboratory) are listed below for reference.    Significant Diagnostic Studies: Dg Chest 2 View  06/11/2013   CLINICAL DATA:  Evaluate for progression of pneumonia.  EXAM: CHEST  2 VIEW  COMPARISON:  Chest x-ray 06/08/2013.  FINDINGS: Opacity in the region of the lingula, favored to predominantly reflect subsegmental atelectasis. No definite consolidative airspace disease. No pleural effusions. Cephalization of the pulmonary vasculature, without frank pulmonary edema. Mild cardiomegaly is unchanged. Upper mediastinal contours are within normal limits. Left-sided pacemaker/ AICD with lead tips projecting over the expected location of the right atrial appendage, right ventricular apex and right ventricular outflow tract.  IMPRESSION: 1. Probable subsegmental atelectasis or scarring in the lingula. No definite consolidative airspace disease to suggest pneumonia at this time. 2. Mild cardiomegaly with pulmonary venous congestion, but no frank pulmonary edema.   Electronically Signed   By: Trudie Reed M.D.   On: 06/11/2013 10:55   Ct Head Wo Contrast  06/06/2013   CLINICAL DATA:  Acute encephalopathy. Defibrillator in place. Dilated cardiomyopathy. Hypertension.  EXAM: CT HEAD WITHOUT CONTRAST  TECHNIQUE: Contiguous axial images were obtained from the base of the skull through the vertex without intravenous contrast.  COMPARISON:  05/06/2013  FINDINGS: No intracranial hemorrhage.  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  Poor delineation of the gray white differentiation. This may be explained by artifact caused by the patient's habitus. Diffuse  anoxic/hypoxic injury can sometimes give a similar appearance and therefore cannot be excluded.  No hydrocephalus.  No intracranial mass lesion noted on this unenhanced exam.  IMPRESSION: No intracranial hemorrhage.  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  Poor delineation of the gray white differentiation. This may be explained by artifact caused by the patient's habitus. Diffuse anoxic/hypoxic injury can sometimes give a similar appearance and therefore cannot be excluded.  These results were called by telephone at the time of interpretation on 06/06/2013 at 6:49 PM to Darl Pikes patient's nurse, who verbally acknowledged these results.   Electronically Signed   By: Bridgett Larsson M.D.   On: 06/06/2013 18:50   US Renal  06/08/2013   CLINICAL DATA:  Acute renal failure  EXAM: RENAL/URINARY TRACT ULTRASOUND COMPLETE  COMPARISON:  None.  FINDINGS: Right Kidney  Length: 10.1 cm. Echogenicity within normal limits. No mass or hydronephrosis visualized.  Left Kidney  Length: 10.1 cm. Echogenicity within normal limits. No mass  or hydronephrosis visualized.  Bladder  Bladder not visualized.  IMPRESSION: Normal renal ultrasound.   Electronically Signed   By: Amie Portland M.D.   On: 06/08/2013 12:03   Dg Chest Port 1 View  06/08/2013   CLINICAL DATA:  Assess edema. COPD.  EXAM: PORTABLE CHEST - 1 VIEW  COMPARISON:  The 06/07/2013.  FINDINGS: AICD sequential pacemaker enters from the left with leads unchanged in position.  Cardiomegaly.  Pulmonary vascular congestion.  Slight improved aeration left base. Minimal blunting left costophrenic angle remains. Small pleural effusion not excluded.  IMPRESSION: Improved aeration left lung base. Please see above.   Electronically Signed   By: Bridgett Larsson M.D.   On: 06/08/2013 07:57   Dg Chest Port 1 View  06/07/2013   CLINICAL DATA:  Respiratory failure.  EXAM: PORTABLE CHEST - 1 VIEW  COMPARISON:  06/06/2013  FINDINGS: Bibasilar  opacity, left greater than right, is stable. No new lung opacities.  The cardiac silhouette is mildly enlarged. Left anterior chest wall ICD is stable in well positioned.  IMPRESSION: Left greater than right lung base opacities are unchanged from the previous day's study, and may reflect atelectasis, infiltrate or a combination. Associated pleural fluid is suggested on the left.  No change from the previous day's study.   Electronically Signed   By: Amie Portland M.D.   On: 06/07/2013 07:09   Dg Chest Port 1 View  06/06/2013   CLINICAL DATA:  Increasing shortness of Breath.  EXAM: PORTABLE CHEST - 1 VIEW  COMPARISON:  12/04/2011  FINDINGS: Bibasilar interstitial and airspace opacities, left greater than right, have developed since the prior study. This may reflect pneumonia, atelectasis or a combination. No evidence of pulmonary edema.  Cardiac silhouette is mildly enlarged. The mediastinum is normal in contour. No pneumothorax.  Left anterior chest wall ICD is stable in well positioned.  IMPRESSION: 1. New, left greater than right, basilar interstitial and airspace opacities. This may reflect atelectasis, infiltrate/pneumonia or combination. 2. No other acute finding and no other change from the prior exam.   Electronically Signed   By: Amie Portland M.D.   On: 06/06/2013 07:50    Microbiology: Recent Results (from the past 240 hour(s))  CULTURE, BLOOD (ROUTINE X 2)     Status: None   Collection Time    06/06/13  8:42 AM      Result Value Range Status   Specimen Description BLOOD RIGHT FOREARM   Final   Special Requests BOTTLES DRAWN AEROBIC AND ANAEROBIC   Final   Culture  Setup Time     Final   Value: 06/06/2013 13:10     Performed at [facility]   Culture     Final   Value: NO GROWTH 5 DAYS     Performed at [facility]   Report Status 06/12/2013 FINAL   Final  CULTURE, BLOOD (ROUTINE X 2)     Status: None   Collection Time    06/06/13  8:52 AM      Result Value  Range Status   Specimen Description BLOOD LEFT FOREARM   Final   Special Requests BOTTLES DRAWN AEROBIC AND ANAEROBIC   Final   Culture  Setup Time     Final   Value: 06/06/2013 13:11     Performed at [facility]   Culture     Final   Value: NO GROWTH 5 DAYS     Performed at [facility]  Report Status 06/12/2013 FINAL   Final  MRSA PCR SCREENING     Status: None   Collection Time    06/06/13 11:31 AM      Result Value Range Status   MRSA by PCR NEGATIVE  NEGATIVE Final   Comment:            The GeneXpert MRSA Assay (FDA     approved for NASAL specimens     only), is one component of a     comprehensive MRSA colonization     surveillance program. It is not     intended to diagnose MRSA     infection nor to guide or     monitor treatment for     MRSA infections.  RESPIRATORY VIRUS PANEL     Status: None   Collection Time    06/06/13  3:01 PM      Result Value Range Status   Source - RVPAN NASOPHARYNGEAL   Final   Respiratory Syncytial Virus A NOT DETECTED   Final   Respiratory Syncytial Virus B NOT DETECTED   Final   Influenza A NOT DETECTED   Final   Influenza B NOT DETECTED   Final   Parainfluenza 1 NOT DETECTED   Final   Parainfluenza 2 NOT DETECTED   Final   Parainfluenza 3 NOT DETECTED   Final   Metapneumovirus NOT DETECTED   Final   Rhinovirus NOT DETECTED   Final   Adenovirus NOT DETECTED   Final   Influenza A H1 NOT DETECTED   Final   Influenza A H3 NOT DETECTED   Final   Comment: (NOTE)           Normal Reference Range for each Analyte: NOT DETECTED     Testing performed using the Luminex xTAG Respiratory Viral Panel test     kit.     This test was developed and its performance characteristics determined     by [facility]. It has not been cleared or approved by the [place]     Food and Drug Administration. This test is used for clinical purposes.     It should not be regarded as investigational or for research. This     laboratory  is certified under the Clinical Laboratory Improvement     Amendments of 1988 (CLIA) as qualified to perform high complexity     clinical laboratory testing.     Performed at [facility]: Basic Metabolic Panel:  Recent Labs Lab 06/06/13 0737 06/06/13 1230 06/07/13 0730  06/09/13 1634 06/10/13 0343 06/10/13 0805 06/11/13 0619 06/12/13 0505  NA 142  --  138  < > 136 136 136 138 141  K 4.4  --  5.1  < > 5.1 5.4* 4.7 4.8 4.4  CL 102  --  105  < > 103 103 102 104 104  CO2 27  --  23  < > 23 25 26 25 29   GLUCOSE 156*  --  167*  < > 151* 151* 143* 105* 84  BUN 21  --  29*  < > 57* 61* 58* 54* 55*  CREATININE 1.81* 1.90* 1.80*  < > 2.22* 2.06* 1.96* 1.81* 2.09*  CALCIUM 9.1  --  8.7  < > 8.9 9.0 8.8 9.3 8.8  MG  --  1.8 1.9  --   --   --   --   --   --   PHOS  --   --  4.1  --   --   --   --   --   --   < > = values in this interval not displayed. Liver Function Tests:  Recent Labs Lab 06/06/13 1533  AST 19  ALT 19  ALKPHOS 90  BILITOT 0.3  PROT 7.2  ALBUMIN 3.0*   No results found for this basename: LIPASE, AMYLASE,  in the last 168 hours  Recent Labs Lab 06/06/13 1832  AMMONIA 26   CBC:  Recent Labs Lab 06/06/13 0737  06/07/13 0730 06/08/13 0400 06/09/13 0405 06/10/13 0343 06/11/13 0619  WBC 10.6*  < > 10.5 12.2* 12.0* 11.7* 13.8*  NEUTROABS 5.0  --  9.0*  --   --   --   --   HGB 13.2  < > 12.1 12.4 12.0 12.4 14.3  HCT 40.5  < > 38.1 36.7 35.9* 36.4 42.7  MCV 77.7*  < > 78.4 77.6* 75.6* 75.8* 76.4*  PLT 182  < > 212 191 194 183 177  < > = values in this interval not displayed. Cardiac Enzymes:  Recent Labs Lab 06/06/13 1533 06/06/13 2258 06/07/13 0730  TROPONINI <0.30 <0.30 <0.30   BNP: BNP (last 3 results)  Recent Labs  06/09/13 1635 06/10/13 0343 06/11/13 0620  PROBNP 240.9* 201.0* 267.0*   CBG:  Recent Labs Lab 06/11/13 0816 06/11/13 1235 06/11/13 1813 06/11/13 2204 06/12/13 0729  GLUCAP 154* 109* 143* 135*  92       Signed:  Comer Devins, JAI-GURMUKH  [facility] 06/12/2013, 10:29 AM

## 2013-06-12 NOTE — Progress Notes (Signed)
ANTIBIOTIC CONSULT NOTE - Follow up  Pharmacy Consult for Levaquin Indication: pneumonia  No Known Allergies  Patient Measurements: Height: 5\' 3"  (160 cm) Weight: 318 lb 9 oz (144.5 kg) IBW/kg (Calculated) : 52.4  Vital Signs: Temp: 97.7 F (36.5 C) (10/16 0530) Temp src: Oral (10/16 0530) BP: 103/69 mmHg (10/16 0530) Pulse Rate: 82 (10/16 0530) Intake/Output from previous day: 10/15 0701 - 10/16 0700 In: 840 [P.O.:840] Out: -   Labs:  Recent Labs  06/10/13 0343 06/10/13 0805 06/11/13 0619 06/12/13 0505  WBC 11.7*  --  13.8*  --   HGB 12.4  --  14.3  --   PLT 183  --  177  --   CREATININE 2.06* 1.96* 1.81* 2.09*   Estimated Creatinine Clearance: 39.8 ml/min (by C-G formula based on Cr of 2.09).   Microbiology: 10/10 blood x2: ngtd, collected prior to abx administration 10/10 MRSA PCR: negative 10/10 Ur Legionella Ag: negative 10/10 S pneumo Ur Ag: negative 10/10 HIV Ab: non reactive 10/10 flu pcr: negative 10/10 resp virus panel: all negative  Anti-infectives: 10/10 >> Vanc >> 10/12 10/10 >> Cefepime >> 10/12 10/10 >> Azithromycin (MD) >> 10/11 10/11 >> Levaquin >>  Assessment: 62 y.o. female presenting with wheezing, shortness of breath, and fell on the way to ED on 10/10. CXR showed new L>R, basilar interstitial and airspace opacities. This may reflect atelectasis, infiltrate/pneumonia or combination. Vancomycin and Cefepime ordered per Pharmacy protocol on 10/10, adding Levaquin today for atypical coverage.  Antibiotics changed to PO Levaquin alone on 10/12.  D6 Levaquin (D7 total abx) for HCAP   Weight: continues to increase, today 144.5kg  SCr up today = 2.09, CrCl ~ 40 ml/min CG  WBC slightly elevated, on steroid taper (no CBC today)  Goal of Therapy:  Levaquin dose per renal function  Plan:   Decrease Levaquin 750mg  PO q 48h, next dose 10/17 Follow up renal function & cultures, noted MD plans for totally 8 days antibiotics (stop date  10/17)  Haynes Hoehn, PharmD 06/12/2013, 10:06 AM  Pager: [phone]

## 2013-07-21 ENCOUNTER — Ambulatory Visit (HOSPITAL_BASED_OUTPATIENT_CLINIC_OR_DEPARTMENT_OTHER): Payer: Medicaid Other

## 2013-08-15 ENCOUNTER — Ambulatory Visit (HOSPITAL_BASED_OUTPATIENT_CLINIC_OR_DEPARTMENT_OTHER): Payer: Medicaid Other | Attending: *Deleted

## 2013-08-15 VITALS — Ht 63.0 in | Wt 299.0 lb

## 2013-08-15 DIAGNOSIS — R0989 Other specified symptoms and signs involving the circulatory and respiratory systems: Secondary | ICD-10-CM | POA: Insufficient documentation

## 2013-08-15 DIAGNOSIS — G4733 Obstructive sleep apnea (adult) (pediatric): Secondary | ICD-10-CM

## 2013-08-15 DIAGNOSIS — R0609 Other forms of dyspnea: Secondary | ICD-10-CM | POA: Insufficient documentation

## 2013-08-15 DIAGNOSIS — G4700 Insomnia, unspecified: Secondary | ICD-10-CM | POA: Insufficient documentation

## 2013-08-15 DIAGNOSIS — G478 Other sleep disorders: Secondary | ICD-10-CM | POA: Insufficient documentation

## 2013-08-15 DIAGNOSIS — M549 Dorsalgia, unspecified: Secondary | ICD-10-CM | POA: Insufficient documentation

## 2013-08-16 DIAGNOSIS — G4733 Obstructive sleep apnea (adult) (pediatric): Secondary | ICD-10-CM

## 2013-08-17 NOTE — Sleep Study (Signed)
   NAME: Monique Kane DATE OF BIRTH:  1951/04/02 MEDICAL RECORD NUMBER [id number]  LOCATION: [facility]  PHYSICIAN: YOUNG,CLINTON D  DATE OF STUDY: 08/15/2013  SLEEP STUDY TYPE: Nocturnal Polysomnogram               REFERRING PHYSICIAN: Fran Lowes, PA-C  INDICATION FOR STUDY: Hypersomnia with sleep apnea  EPWORTH SLEEPINESS SCORE:   4/24 HEIGHT: 5\' 3"  (160 cm)  WEIGHT: 299 lb (135.626 kg)    Body mass index is 52.98 kg/(m^2).  NECK SIZE: 17 in.  MEDICATIONS: Chart for review  SLEEP ARCHITECTURE: Total sleep time 134 minutes with sleep efficiency 34.5%. Stage I was 3%, stage II 89.9%, stage III 7.1%, REM absent. Sleep latency 59 minutes, awake after sleep onset 22 minutes, arousal index 4.9. Bedtime medication: None  RESPIRATORY DATA: Apnea hypopnea index (AHI) 0.4 per hour. A single event was recorded as a hypopnea in non-REM sleep. CPAP titration was not done.  OXYGEN DATA: Moderately loud snoring with oxygen desaturation to 80 or 80% and mean oxygen saturation through the study at 93% on room air.  CARDIAC DATA: Sinus rhythm. Patient has pacemaker.  MOVEMENT/PARASOMNIA: Some limb jerks were noted but with no apparent effect on sleep. No bathroom trips.  IMPRESSION/ RECOMMENDATION:   1) After sleep onset at approximately 11:30 PM, she slept only until spontaneously awakening around 1:30 AM and did not regain sleep for the rest of the study night. She stayed on her sides because of back pain. Suggest emphasis on management as insomnia. 2) Rare respiratory events with sleep disturbance, within normal limits. AHI 0.4 per hour (the normal range for adults is an AHI between 0 and 5 events per hour). Moderately loud snoring with oxygen desaturation to a nadir of 88% and mean oxygen saturation through the study of 93% on room air.  Waymon Budge Diplomate, American Board of Sleep Medicine  Signed Jetty Duhamel M.D. ELECTRONICALLY SIGNED ON:   08/17/2013, 9:34 AM [facility] PH: [phone]   FX: [phone] ACCREDITED BY THE AMERICAN ACADEMY OF SLEEP MEDICINE

## 2013-10-05 ENCOUNTER — Ambulatory Visit (HOSPITAL_BASED_OUTPATIENT_CLINIC_OR_DEPARTMENT_OTHER): Payer: Medicaid Other | Attending: Medical

## 2013-10-05 VITALS — Ht 63.0 in | Wt 299.0 lb

## 2013-10-05 DIAGNOSIS — G4730 Sleep apnea, unspecified: Principal | ICD-10-CM

## 2013-10-05 DIAGNOSIS — R000 Tachycardia, unspecified: Secondary | ICD-10-CM | POA: Insufficient documentation

## 2013-10-05 DIAGNOSIS — G4700 Insomnia, unspecified: Secondary | ICD-10-CM | POA: Insufficient documentation

## 2013-10-05 DIAGNOSIS — G4733 Obstructive sleep apnea (adult) (pediatric): Secondary | ICD-10-CM

## 2013-10-11 DIAGNOSIS — G4733 Obstructive sleep apnea (adult) (pediatric): Secondary | ICD-10-CM

## 2013-10-11 NOTE — Sleep Study (Signed)
   NAME: Monique Kane DATE OF BIRTH:  [birth year] MEDICAL RECORD NUMBER [id number]  LOCATION: [facility]  PHYSICIAN: YOUNG,CLINTON D  DATE OF STUDY: 10/05/2013  SLEEP STUDY TYPE: Nocturnal Polysomnogram               REFERRING PHYSICIAN: [name], PA-C  INDICATION FOR STUDY: Insomnia with sleep apnea  EPWORTH SLEEPINESS SCORE:   4/24 HEIGHT: 5\' 3"  (160 cm)  WEIGHT: 299 lb (135.626 kg)    Body mass index is 52.98 kg/(m^2).  NECK SIZE: 17 in.  MEDICATIONS: Charted for review  SLEEP ARCHITECTURE: Total sleep time 406 minutes with sleep efficiency 97.9%. Stage I was 0.9%, stage II 99.1%, stage III and REM were absent. Sleep latency 2.5 minutes, awake after sleep onset 1 minute, arousal index 0.1. Bedtime medication: Ambien  RESPIRATORY DATA: Apnea hypopneas index (AHI) 3.4 per hour. 23 events scored, all as hypopneas associated with supine sleep position. There were not enough events to qualify for split protocol CPAP titration.  OXYGEN DATA: Moderately loud snoring with oxygen desaturation to a nadir of 89% and mean oxygen saturation through the study of 94.9% on room air.  CARDIAC DATA: Cardiac rhythm appears to be paced at a regular rate of 114 per minute.  MOVEMENT/PARASOMNIA: No significant movement disturbance. No bathroom trips  IMPRESSION/ RECOMMENDATION:   1) Sleep architecture is unusual for very high percentage of time spent in stage II, consistent with medication effect. Ambien was taken at bedtime. 2) Occasional respiratory events with sleep disturbance, within normal limits.  AHI 3.4 per hour (the normal range for adults is an AHI from 0-5 events per hour). Moderately loud snoring with oxygen desaturation to a nadir of 89% and mean oxygen saturation through the study of 94.9% on room air. 3) Sustained tachycardia at 114, unusually fast for what appears to be a paced rhythm.  4) Review of the video monitor reveals intervals of frequent leg  and arm jerks, not reflected in the movement block summarized above and without apparent associated sleep disturbance.  Signed Baird Lyons M.D. Deneise Lever Diplomate, American Board of Sleep Medicine  ELECTRONICALLY SIGNED ON:  10/11/2013, 9:30 AM [place] [facility] PH: [phone]   FX: [phone] [place]

## 2013-12-22 ENCOUNTER — Encounter (HOSPITAL_COMMUNITY): Payer: Self-pay | Admitting: Emergency Medicine

## 2013-12-22 ENCOUNTER — Emergency Department (HOSPITAL_COMMUNITY)
Admission: EM | Admit: 2013-12-22 | Discharge: 2013-12-22 | Disposition: A | Discharge status: Home / Self Care | Payer: Medicaid Other | Attending: Emergency Medicine | Admitting: Emergency Medicine

## 2013-12-22 DIAGNOSIS — N3090 Cystitis, unspecified without hematuria: Secondary | ICD-10-CM | POA: Insufficient documentation

## 2013-12-22 DIAGNOSIS — J449 Chronic obstructive pulmonary disease, unspecified: Secondary | ICD-10-CM | POA: Insufficient documentation

## 2013-12-22 DIAGNOSIS — Z79899 Other long term (current) drug therapy: Secondary | ICD-10-CM | POA: Insufficient documentation

## 2013-12-22 DIAGNOSIS — B3741 Candidal cystitis and urethritis: Secondary | ICD-10-CM

## 2013-12-22 DIAGNOSIS — Z9981 Dependence on supplemental oxygen: Secondary | ICD-10-CM | POA: Insufficient documentation

## 2013-12-22 DIAGNOSIS — Z7982 Long term (current) use of aspirin: Secondary | ICD-10-CM | POA: Insufficient documentation

## 2013-12-22 DIAGNOSIS — N342 Other urethritis: Secondary | ICD-10-CM | POA: Insufficient documentation

## 2013-12-22 DIAGNOSIS — Z8719 Personal history of other diseases of the digestive system: Secondary | ICD-10-CM | POA: Insufficient documentation

## 2013-12-22 DIAGNOSIS — IMO0002 Reserved for concepts with insufficient information to code with codable children: Secondary | ICD-10-CM | POA: Insufficient documentation

## 2013-12-22 DIAGNOSIS — I509 Heart failure, unspecified: Secondary | ICD-10-CM | POA: Insufficient documentation

## 2013-12-22 DIAGNOSIS — I209 Angina pectoris, unspecified: Secondary | ICD-10-CM | POA: Insufficient documentation

## 2013-12-22 DIAGNOSIS — I10 Essential (primary) hypertension: Secondary | ICD-10-CM | POA: Insufficient documentation

## 2013-12-22 DIAGNOSIS — Z95810 Presence of automatic (implantable) cardiac defibrillator: Secondary | ICD-10-CM | POA: Insufficient documentation

## 2013-12-22 DIAGNOSIS — J4489 Other specified chronic obstructive pulmonary disease: Secondary | ICD-10-CM | POA: Insufficient documentation

## 2013-12-22 DIAGNOSIS — Z87891 Personal history of nicotine dependence: Secondary | ICD-10-CM | POA: Insufficient documentation

## 2013-12-22 DIAGNOSIS — Z87440 Personal history of urinary (tract) infections: Secondary | ICD-10-CM | POA: Insufficient documentation

## 2013-12-22 DIAGNOSIS — M109 Gout, unspecified: Secondary | ICD-10-CM | POA: Insufficient documentation

## 2013-12-22 DIAGNOSIS — R110 Nausea: Secondary | ICD-10-CM

## 2013-12-22 DIAGNOSIS — B372 Candidiasis of skin and nail: Secondary | ICD-10-CM | POA: Insufficient documentation

## 2013-12-22 DIAGNOSIS — E669 Obesity, unspecified: Secondary | ICD-10-CM | POA: Insufficient documentation

## 2013-12-22 DIAGNOSIS — G8929 Other chronic pain: Secondary | ICD-10-CM | POA: Insufficient documentation

## 2013-12-22 DIAGNOSIS — R112 Nausea with vomiting, unspecified: Secondary | ICD-10-CM | POA: Insufficient documentation

## 2013-12-22 DIAGNOSIS — B86 Scabies: Secondary | ICD-10-CM

## 2013-12-22 LAB — CBC WITH DIFFERENTIAL/PLATELET
BASOS ABS: 0 10*3/uL (ref 0.0–0.1)
Basophils Relative: 0 % (ref 0–1)
EOS PCT: 4 % (ref 0–5)
Eosinophils Absolute: 0.4 10*3/uL (ref 0.0–0.7)
HEMATOCRIT: 42.8 % (ref 36.0–46.0)
Hemoglobin: 14.3 g/dL (ref 12.0–15.0)
LYMPHS ABS: 2.8 10*3/uL (ref 0.7–4.0)
Lymphocytes Relative: 28 % (ref 12–46)
MCH: 26.6 pg (ref 26.0–34.0)
MCHC: 33.4 g/dL (ref 30.0–36.0)
MCV: 79.7 fL (ref 78.0–100.0)
Monocytes Absolute: 1 10*3/uL (ref 0.1–1.0)
Monocytes Relative: 10 % (ref 3–12)
Neutro Abs: 5.7 10*3/uL (ref 1.7–7.7)
Neutrophils Relative %: 58 % (ref 43–77)
Platelets: 300 10*3/uL (ref 150–400)
RBC: 5.37 MIL/uL — ABNORMAL HIGH (ref 3.87–5.11)
RDW: 16.4 % — AB (ref 11.5–15.5)
WBC: 10 10*3/uL (ref 4.0–10.5)

## 2013-12-22 LAB — COMPREHENSIVE METABOLIC PANEL
ALT: 29 U/L (ref 0–35)
AST: 26 U/L (ref 0–37)
Albumin: 3.6 g/dL (ref 3.5–5.2)
Alkaline Phosphatase: 116 U/L (ref 39–117)
BUN: 28 mg/dL — AB (ref 6–23)
CALCIUM: 9.9 mg/dL (ref 8.4–10.5)
CO2: 28 meq/L (ref 19–32)
Chloride: 98 mEq/L (ref 96–112)
Creatinine, Ser: 1.6 mg/dL — ABNORMAL HIGH (ref 0.50–1.10)
GFR, EST AFRICAN AMERICAN: 39 mL/min — AB (ref >90)
GFR, EST NON AFRICAN AMERICAN: 34 mL/min — AB (ref >90)
Glucose, Bld: 128 mg/dL — ABNORMAL HIGH (ref 70–99)
Potassium: 4 mEq/L (ref 3.7–5.3)
Sodium: 142 mEq/L (ref 137–147)
Total Bilirubin: 0.3 mg/dL (ref 0.3–1.2)
Total Protein: 8.6 g/dL — ABNORMAL HIGH (ref 6.0–8.3)

## 2013-12-22 LAB — URINE MICROSCOPIC-ADD ON

## 2013-12-22 LAB — URINALYSIS, ROUTINE W REFLEX MICROSCOPIC
Glucose, UA: NEGATIVE mg/dL
Hgb urine dipstick: NEGATIVE
Ketones, ur: NEGATIVE mg/dL
Nitrite: NEGATIVE
PH: 5 (ref 5.0–8.0)
Protein, ur: NEGATIVE mg/dL
SPECIFIC GRAVITY, URINE: 1.02 (ref 1.005–1.030)
UROBILINOGEN UA: 0.2 mg/dL (ref 0.0–1.0)

## 2013-12-22 LAB — LIPASE, BLOOD: Lipase: 38 U/L (ref 11–59)

## 2013-12-22 MED ORDER — PERMETHRIN 5 % EX CREA: TOPICAL_CREAM | CUTANEOUS | Status: DC

## 2013-12-22 MED ORDER — ONDANSETRON 4 MG PO TBDP
8.0000 mg | ORAL_TABLET | Freq: Once | ORAL | Status: AC
  Administered 2013-12-22: 8 mg via ORAL
  Filled 2013-12-22: qty 2

## 2013-12-22 MED ORDER — ONDANSETRON 8 MG PO TBDP: 8.0000 mg | ORAL_TABLET | Freq: Three times a day (TID) | ORAL | Status: DC | PRN

## 2013-12-22 MED ORDER — FLUCONAZOLE 200 MG PO TABS: 200.0000 mg | ORAL_TABLET | Freq: Every day | ORAL | Status: AC

## 2013-12-22 NOTE — ED Notes (Signed)
PA at bedside.

## 2013-12-22 NOTE — ED Notes (Signed)
Pt reports that grandchildren have Scabies diagnosed today.

## 2013-12-22 NOTE — ED Notes (Addendum)
Pt reports that she sent to the PCP last week and dx with scabies. Reports that she was Rx cream but has not gotten it filled yet. States that she is now having nausea and vomiting. Generalized fatigue. States the rash is getting worse and she is concerned that it's  In her blood.

## 2013-12-22 NOTE — ED Notes (Signed)
Wears 4L home oxygen. Placed on 4L

## 2013-12-22 NOTE — ED Provider Notes (Signed)
CSN: [id number]     Arrival date & time 12/22/13  1129 History   First MD Initiated Contact with Patient 12/22/13 1222     Chief Complaint  Patient presents with  . Rash  . Nausea  . Emesis     (Consider location/radiation/quality/duration/timing/severity/associated sxs/prior Treatment) HPI Monique Kane is a 63 y.o. female who presents to emergency department complaining of rash, nausea, vomiting, abdominal pain. Patient states she has had a rash for 2 weeks. She was seen by her doctor and was told this was scabies. She states rash is very itchy, started on her hands and her feet, now spread to entire back, abdomen, buttock, back of the legs, creases of the hips. Patient states that she was unable to get her prescription filled because the prescription was faxed and was lost in process. Patient states she has tried hydrocortisone cream and Benadryl with no relief. Patient states yesterday she developed nausea, vomiting, abdominal pain. Patient states that she does not have any chronic abdominal issues. She does have history of back pains and states she currently has a back pain. She does admit to some urinary frequency, mild dysuria, no other complaints. States that her vomiting began last night, no vomiting this morning. She states she did not eat anything today because of the nausea. She did not take any medications for this.  Past Medical History  Diagnosis Date  . CHF (congestive heart failure)   . Hypertension   . COPD (chronic obstructive pulmonary disease)   . Angina   . Dysrhythmia 09/12/11    ventricular paced  . Shortness of breath on exertion   . Chronic lower back pain   . Headache(784.0)   . Dizziness   . Gout   . Hx: UTI (urinary tract infection)   . Cardiac defibrillator in place   . Pacemaker   . Bronchitis, chronic   . Refusal of blood transfusions as patient is Jehov[employer]'s Witness   . Arthritis     hx of gout  . Unspecified constipation/ chronic 06/06/2013    Past Surgical History  Procedure Laterality Date  . 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 Joint replacement    . Tubal ligation    . Insert / replace / remove pacemaker  05/2007    w/defibrillator   History reviewed. No pertinent family history. History  Substance Use Topics  . Smoking status: Former Smoker -- 0.50 packs/day for 15 years    Types: Cigarettes  . Smokeless tobacco: Never Used     Comment: quit smoking cigarettes ~ 2007  . Alcohol Use: Yes     Comment: 09/12/11 "rarely drink; not even once a month"   OB History   Grav Para Term Preterm Abortions TAB SAB Ect Mult Living                 Review of Systems  Constitutional: Negative for fever and chills.  Respiratory: Negative for cough, chest tightness and shortness of breath.   Cardiovascular: Negative for chest pain, palpitations and leg swelling.  Gastrointestinal: Positive for nausea, vomiting and abdominal pain. Negative for diarrhea.  Genitourinary: Positive for dysuria, urgency and flank pain. Negative for vaginal bleeding, vaginal discharge, vaginal pain and pelvic pain.  Musculoskeletal: Negative for arthralgias, myalgias, neck pain and neck stiffness.  Skin: Positive for rash.  Neurological: Negative for dizziness, weakness and headaches.  All other systems reviewed and are negative.     Allergies  Review of patient's allergies indicates  no known allergies.  Home Medications   Prior to Admission medications   Medication Sig Start Date End Date Taking? Authorizing Provider  allopurinol (ZYLOPRIM) 100 MG tablet Take 100 mg by mouth every morning.     Historical Provider, MD  aspirin EC 81 MG tablet Take 81 mg by mouth every morning.     Historical Provider, MD  levalbuterol [facility] HFA) 45 MCG/ACT inhaler Inhale 1-2 puffs into the lungs every 4 (four) hours as needed. For shortness of breath    Historical Provider, MD  levofloxacin (LEVAQUIN) 750 MG tablet Take 1 tablet (750 mg total) by mouth  every other day. 06/13/13   Nita Sells, MD  metoprolol succinate (TOPROL-XL) 50 MG 24 hr tablet Take 1 tablet (50 mg total) by mouth daily. Take with or immediately following a meal. 06/12/13   Nita Sells, MD  omeprazole (PRILOSEC) 20 MG capsule Take 20 mg by mouth daily.     Historical Provider, MD  pravastatin (PRAVACHOL) 20 MG tablet Take 20 mg by mouth daily.    Historical Provider, MD  predniSONE (DELTASONE) 20 MG tablet Take 1 tablet (20 mg total) by mouth daily with breakfast. 06/12/13   Nita Sells, MD  torsemide (DEMADEX) 20 MG tablet Take 1 tablet (20 mg total) by mouth daily. 06/12/13   Nita Sells, MD   BP 121/93  Pulse 93  Temp(Src) 98.5 F (36.9 C) (Oral)  Resp 18  Ht 5\' 4"  (1.626 m)  Wt 299 lb (135.626 kg)  BMI 51.30 kg/m2  SpO2 95% Physical Exam  Nursing note and vitals reviewed. Constitutional: She appears well-developed and well-nourished. No distress.  obese  HENT:  Head: Normocephalic.  Eyes: Conjunctivae are normal.  Neck: Neck supple.  Cardiovascular: Normal rate, regular rhythm and normal heart sounds.   Pulmonary/Chest: Effort normal and breath sounds normal. No respiratory distress. She has no wheezes. She has no rales.  Abdominal: Soft. Bowel sounds are normal. She exhibits no distension. There is tenderness. There is no rebound.  Diffuse abdominal tenderness. Right CVA tenderness.   Musculoskeletal: She exhibits no edema.  Neurological: She is alert.  Skin: Skin is warm and dry.  Dry, papular, scaly rash to bilateral hands, webs of fingers, wrists, ankles, feet, lower back, lower abdomen, buttock.   Psychiatric: She has a normal mood and affect. Her behavior is normal.    ED Course  Procedures (including critical care time) Labs Review Labs Reviewed  CBC WITH DIFFERENTIAL - Abnormal; Notable for the following:    RBC 5.37 (*)    RDW 16.4 (*)    All other components within normal limits  COMPREHENSIVE METABOLIC  PANEL - Abnormal; Notable for the following:    Glucose, Bld 128 (*)    BUN 28 (*)    Creatinine, Ser 1.60 (*)    Total Protein 8.6 (*)    GFR calc non Af Amer 34 (*)    GFR calc Af Amer 39 (*)    All other components within normal limits  URINALYSIS, ROUTINE W REFLEX MICROSCOPIC - Abnormal; Notable for the following:    APPearance TURBID (*)    Bilirubin Urine SMALL (*)    Leukocytes, UA MODERATE (*)    All other components within normal limits  URINE MICROSCOPIC-ADD ON - Abnormal; Notable for the following:    Squamous Epithelial / LPF MANY (*)    All other components within normal limits  URINE CULTURE  LIPASE, BLOOD    Imaging Review No results found.  EKG Interpretation None      MDM   Final diagnoses:  Candidal cystitis and urethritis  Nausea  Scabies    Patient in emergency department complaining of rash. Everybody in her household has similar rash over just seen here in ER and diagnosed with scabies. Patient's vital signs are normal. She is on home O2, and is on oxygen here. She denies any shortness of breath or chest pain. She does report diffuse abdominal pain, nausea, vomiting that began yesterday. She's tolerating by mouth fluids here in emergency department. Her labs are unremarkable. There is no guarding on abdominal exam. Do not think she has an acute abdomen I do not think any further imaging indicated this time. Her urinalysis did not show any signs of infection. Culture sent. Will discharge home with Zofran for nausea, and Elimite for her scabies. Followup with primary care doctor.   Filed Vitals:   12/22/13 1330 12/22/13 1400 12/22/13 1430 12/22/13 1530  BP: 114/78 120/96 92/64 99/83   Pulse: 88 112 90 86  Temp:      TempSrc:      Resp:      Height:      Weight:      SpO2: 100% 100% 100% 100%       Itzae Mccurdy A Eller Sweis, PA-C 12/22/13 1604

## 2013-12-22 NOTE — Discharge Instructions (Signed)
Use elimite as prescribed. Repeat in 1 week. Use diflucan for yeast infection, take until all gone. zofran for nausea. Follow up with primary care doctor regarding abdominal pain, nausea, vomiting. Return if worsening.   Scabies Scabies are small bugs (mites) that burrow under the skin and cause red bumps and severe itching. These bugs can only be seen with a microscope. Scabies are highly contagious. They can spread easily from person to person by direct contact. They are also spread through sharing clothing or linens that have the scabies mites living in them. It is not unusual for an entire family to become infected through shared towels, clothing, or bedding.  HOME CARE INSTRUCTIONS   Your caregiver may prescribe a cream or lotion to kill the mites. If cream is prescribed, massage the cream into the entire body from the neck to the bottom of both feet. Also massage the cream into the scalp and face if your child is less than 6 year old. Avoid the eyes and mouth. Do not wash your hands after application.  Leave the cream on for 8 to 12 hours. Your child should bathe or shower after the 8 to 12 hour application period. Sometimes it is helpful to apply the cream to your child right before bedtime.  One treatment is usually effective and will eliminate approximately 95% of infestations. For severe cases, your caregiver may decide to repeat the treatment in 1 week. Everyone in your household should be treated with one application of the cream.  New rashes or burrows should not appear within 24 to 48 hours after successful treatment. However, the itching and rash may last for 2 to 4 weeks after successful treatment. Your caregiver may prescribe a medicine to help with the itching or to help the rash go away more quickly.  Scabies can live on clothing or linens for up to 3 days. All of your child's recently used clothing, towels, stuffed toys, and bed linens should be washed in hot water and then dried in  a dryer for at least 20 minutes on high heat. Items that cannot be washed should be enclosed in a plastic bag for at least 3 days.  To help relieve itching, bathe your child in a cool bath or apply cool washcloths to the affected areas.  Your child may return to school after treatment with the prescribed cream. SEEK MEDICAL CARE IF:   The itching persists longer than 4 weeks after treatment.  The rash spreads or becomes infected. Signs of infection include red blisters or yellow-tan crust. Document Released: 08/14/2005 Document Revised: 11/06/2011 Document Reviewed: 12/23/2008 [facility] Patient Information 2014 [place].    Nausea and Vomiting Nausea is a sick feeling that often comes before throwing up (vomiting). Vomiting is a reflex where stomach contents come out of your mouth. Vomiting can cause severe loss of body fluids (dehydration). Children and elderly adults can become dehydrated quickly, especially if they also have diarrhea. Nausea and vomiting are symptoms of a condition or disease. It is important to find the cause of your symptoms. CAUSES   Direct irritation of the stomach lining. This irritation can result from increased acid production (gastroesophageal reflux disease), infection, food poisoning, taking certain medicines (such as nonsteroidal anti-inflammatory drugs), alcohol use, or tobacco use.  Signals from the brain.These signals could be caused by a headache, heat exposure, an inner ear disturbance, increased pressure in the brain from injury, infection, a tumor, or a concussion, pain, emotional stimulus, or metabolic problems.  An  obstruction in the gastrointestinal tract (bowel obstruction).  Illnesses such as diabetes, hepatitis, gallbladder problems, appendicitis, kidney problems, cancer, sepsis, atypical symptoms of a heart attack, or eating disorders.  Medical treatments such as chemotherapy and radiation.  Receiving medicine that makes you sleep  (general anesthetic) during surgery. DIAGNOSIS Your caregiver may ask for tests to be done if the problems do not improve after a few days. Tests may also be done if symptoms are severe or if the reason for the nausea and vomiting is not clear. Tests may include:  Urine tests.  Blood tests.  Stool tests.  Cultures (to look for evidence of infection).  X-rays or other imaging studies. Test results can help your caregiver make decisions about treatment or the need for additional tests. TREATMENT You need to stay well hydrated. Drink frequently but in small amounts.You may wish to drink water, sports drinks, clear broth, or eat frozen ice pops or gelatin dessert to help stay hydrated.When you eat, eating slowly may help prevent nausea.There are also some antinausea medicines that may help prevent nausea. HOME CARE INSTRUCTIONS   Take all medicine as directed by your caregiver.  If you do not have an appetite, do not force yourself to eat. However, you must continue to drink fluids.  If you have an appetite, eat a normal diet unless your caregiver tells you differently.  Eat a variety of complex carbohydrates (rice, wheat, potatoes, bread), lean meats, yogurt, fruits, and vegetables.  Avoid high-fat foods because they are more difficult to digest.  Drink enough water and fluids to keep your urine clear or pale yellow.  If you are dehydrated, ask your caregiver for specific rehydration instructions. Signs of dehydration may include:  Severe thirst.  Dry lips and mouth.  Dizziness.  Dark urine.  Decreasing urine frequency and amount.  Confusion.  Rapid breathing or pulse. SEEK IMMEDIATE MEDICAL CARE IF:   You have blood or brown flecks (like coffee grounds) in your vomit.  You have black or bloody stools.  You have a severe headache or stiff neck.  You are confused.  You have severe abdominal pain.  You have chest pain or trouble breathing.  You do not urinate  at least once every 8 hours.  You develop cold or clammy skin.  You continue to vomit for longer than 24 to 48 hours.  You have a fever. MAKE SURE YOU:   Understand these instructions.  Will watch your condition.  Will get help right away if you are not doing well or get worse. Document Released: 08/14/2005 Document Revised: 11/06/2011 Document Reviewed: 01/11/2011 [facility] Patient Information 2014 [place], [place].

## 2013-12-23 LAB — URINE CULTURE: Colony Count: >=100000

## 2013-12-24 NOTE — ED Provider Notes (Signed)
Medical screening examination/treatment/procedure(s) were performed by non-physician practitioner and as supervising physician I was immediately available for consultation/collaboration.   EKG Interpretation None        Houston Siren III, MD 12/24/13 1028

## 2014-02-19 IMAGING — CT CT HEAD W/O CM
1 series · 15 of 30 positions shown, 19 images · non-contrast
Comparison: 05/06/2013

CLINICAL DATA: Acute encephalopathy. Defibrillator in place.
Dilated cardiomyopathy. Hypertension.

EXAM:
CT HEAD WITHOUT CONTRAST
TECHNIQUE: Contiguous axial images were obtained from the base of the skull
through the vertex without intravenous contrast.

[Series 2: head 5.0 h45f · axial · 0.47mm/px · z∈[-608,-473]mm · 15 of 30 slices shown, 19 images]
[im 2/30  brain]
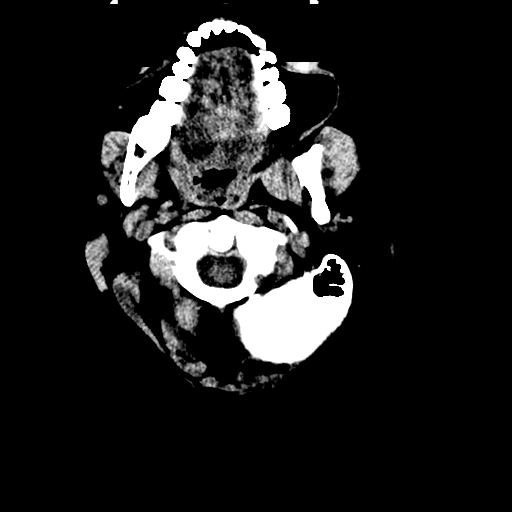
[im 2/30  bone]
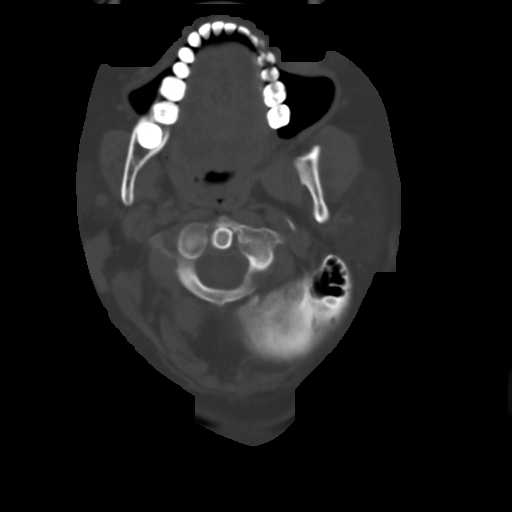
[im 4/30  brain]
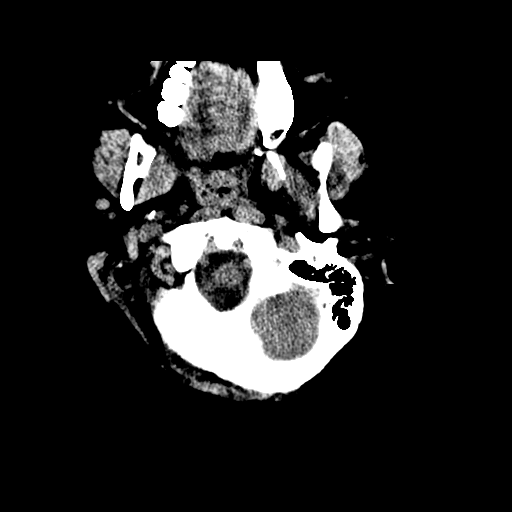
[im 6/30  brain]
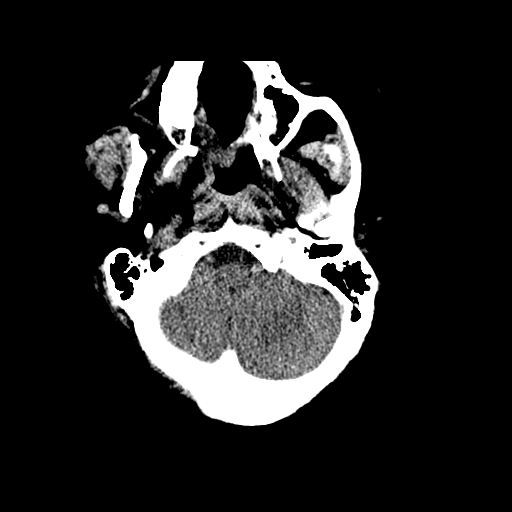
[im 8/30  brain]
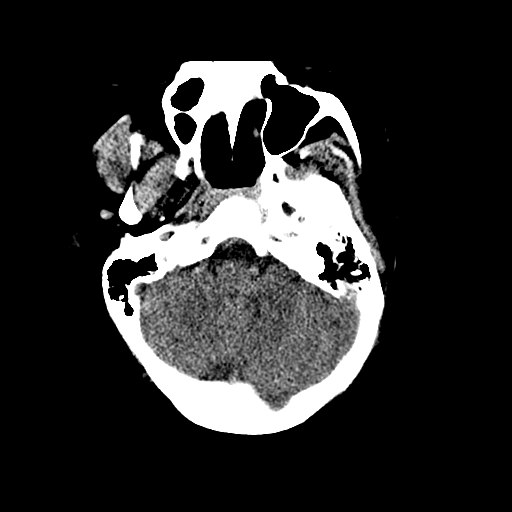
[im 10/30  brain]
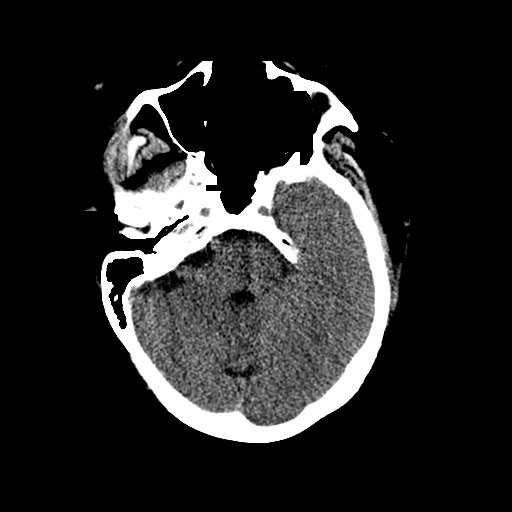
[im 10/30  bone]
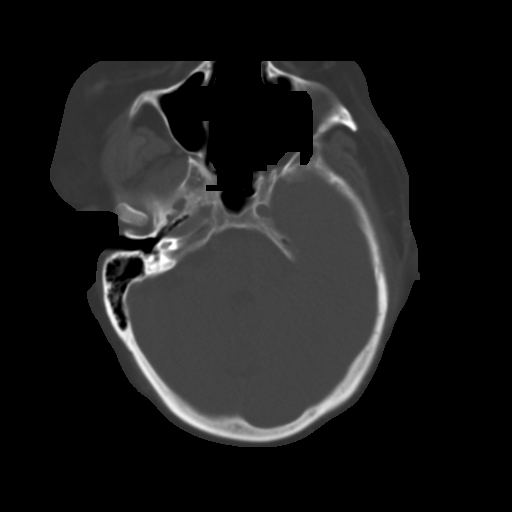
[im 12/30  brain]
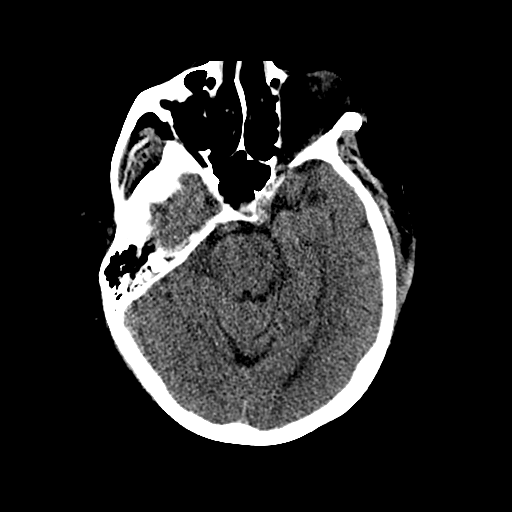
[im 14/30  brain]
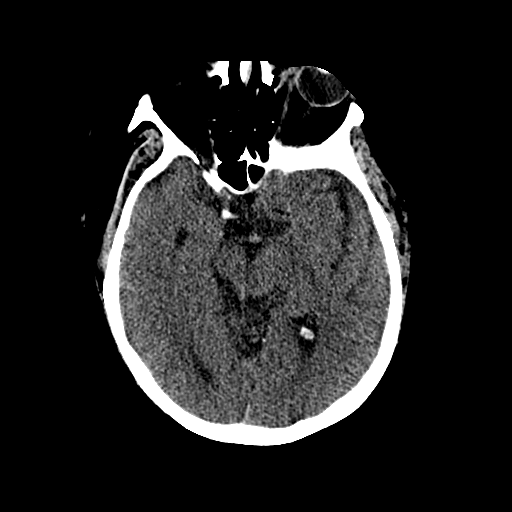
[im 16/30  brain]
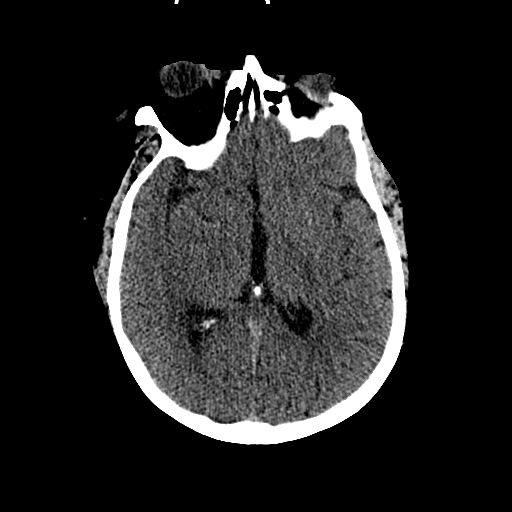
[im 17/30  brain]
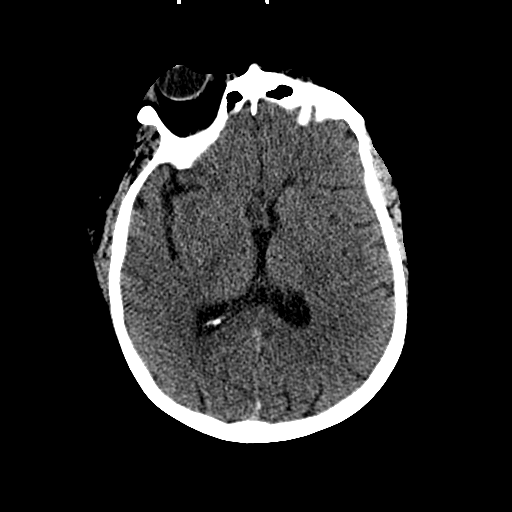
[im 17/30  bone]
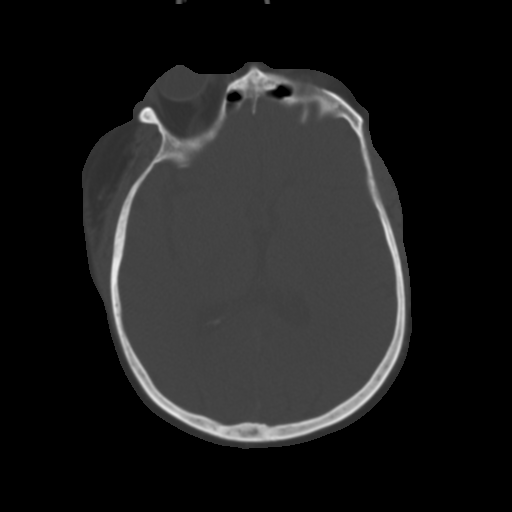
[im 19/30  brain]
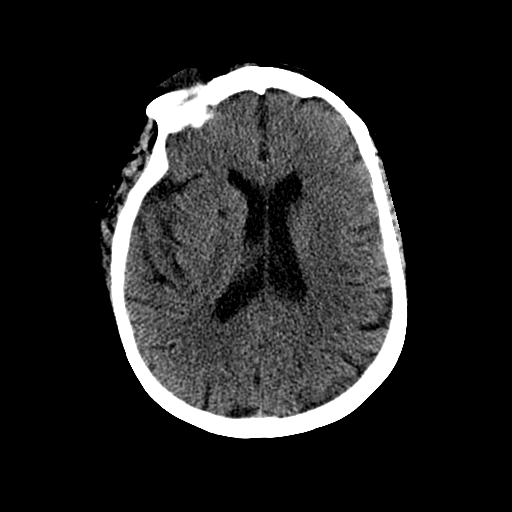
[im 21/30  brain]
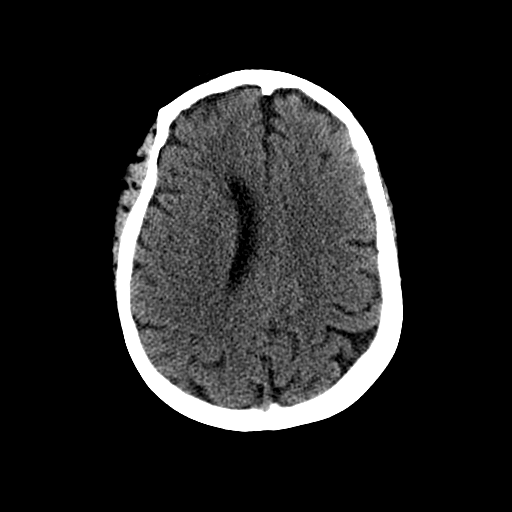
[im 23/30  brain]
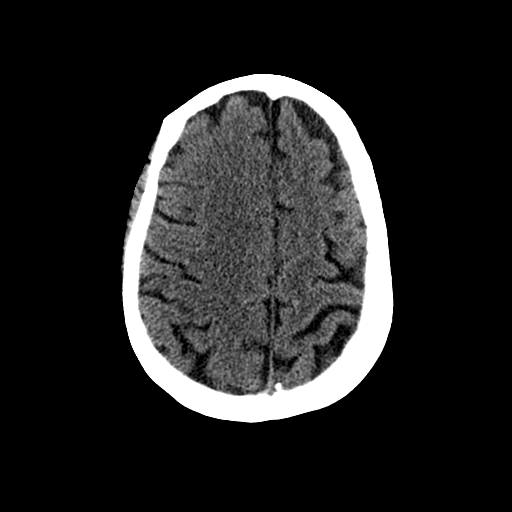
[im 25/30  brain]
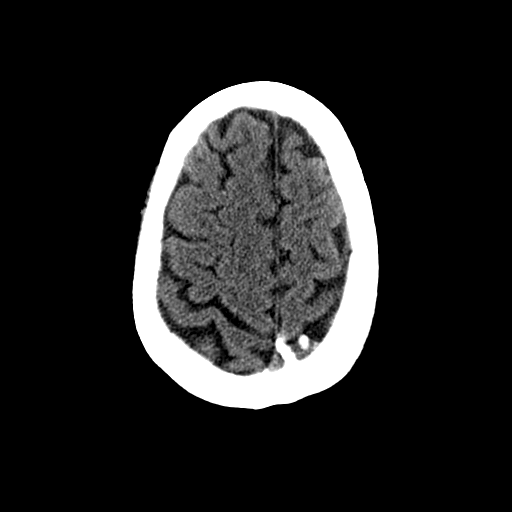
[im 25/30  bone]
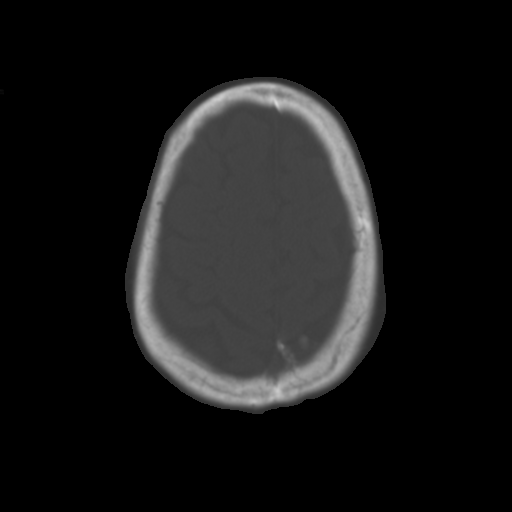
[im 27/30  brain]
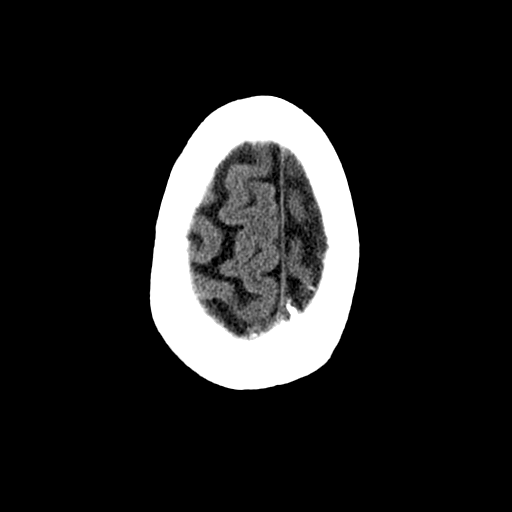
[im 29/30  brain]
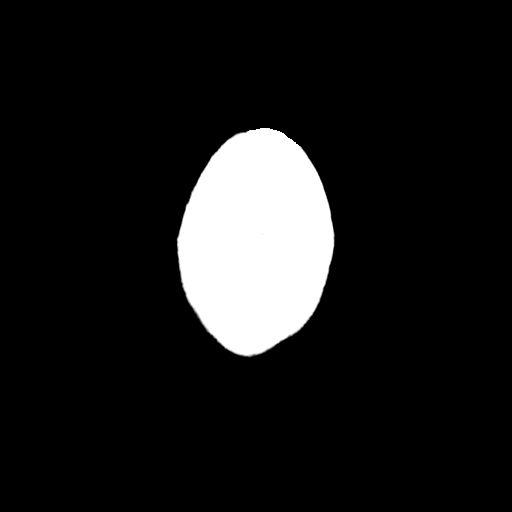

[15 of 30 positions shown; findings below may reference images not displayed]

FINDINGS: No intracranial hemorrhage.

Remote small infarct right lenticular nucleus/anterior limb right
internal capsule.

Poor delineation of the gray white differentiation. This may be
explained by artifact caused by the patient's habitus. Diffuse
anoxic/hypoxic injury can sometimes give a similar appearance and
therefore cannot be excluded.

No hydrocephalus.

No intracranial mass lesion noted on this unenhanced exam.
IMPRESSION: No intracranial hemorrhage.

Remote small infarct right lenticular nucleus/anterior limb right
internal capsule.

Poor delineation of the gray white differentiation. This may be
explained by artifact caused by the patient's habitus. Diffuse
anoxic/hypoxic injury can sometimes give a similar appearance and
therefore cannot be excluded.

These results were called by telephone at the time of interpretation
on 06/06/2013 at [DATE] to Clifor patient's nurse, who verbally
acknowledged these results.

## 2014-02-19 IMAGING — CR DG CHEST 1V PORT
1 series · 1 of 1 positions shown · non-contrast
Comparison: 12/04/2011

CLINICAL DATA: Increasing shortness of Breath.

EXAM:
PORTABLE CHEST - 1 VIEW

[AP]
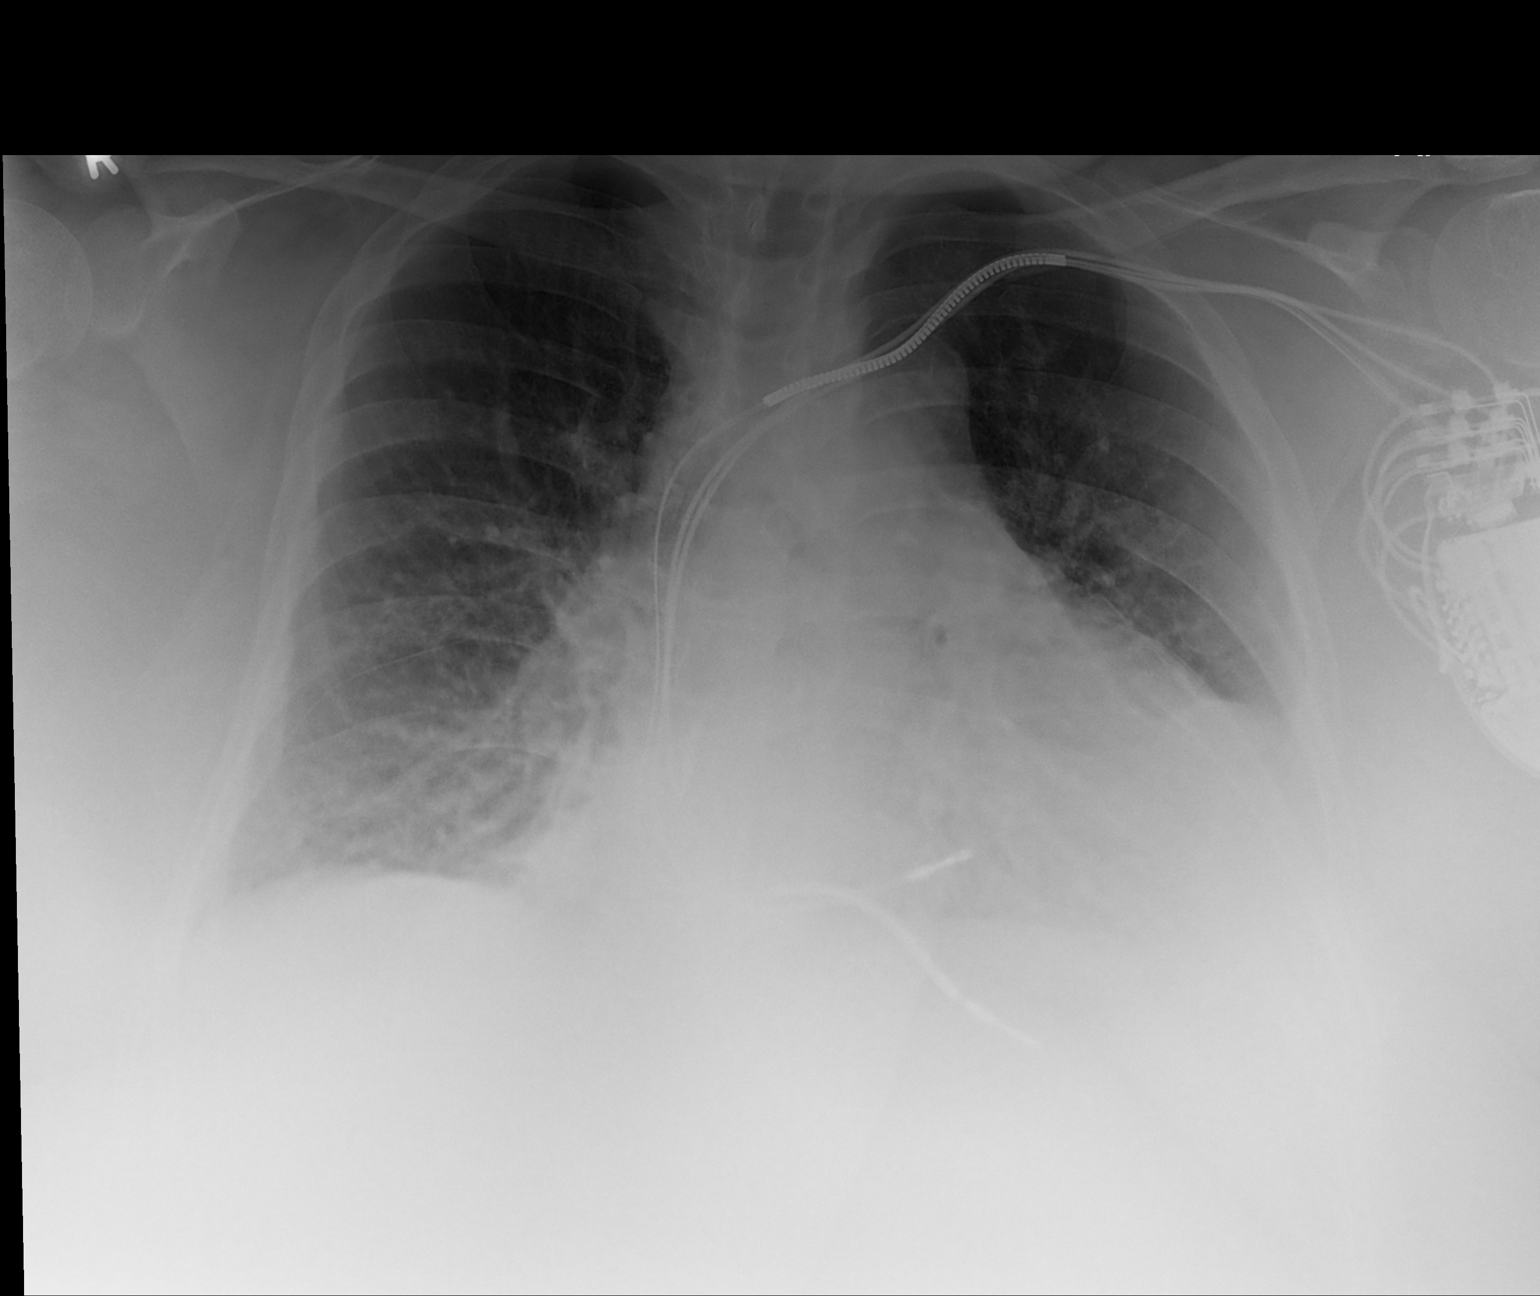

[1 of 1 positions shown; findings below may reference images not displayed]

FINDINGS: Bibasilar interstitial and airspace opacities, left greater than
right, have developed since the prior study. This may reflect
pneumonia, atelectasis or a combination. No evidence of pulmonary
edema.

Cardiac silhouette is mildly enlarged. The mediastinum is normal in
contour. No pneumothorax.

Left anterior chest wall ICD is stable in well positioned.
IMPRESSION: 1. New, left greater than right, basilar interstitial and airspace
opacities. This may reflect atelectasis, infiltrate/pneumonia or
combination.
2. No other acute finding and no other change from the prior exam.

## 2014-07-09 ENCOUNTER — Encounter (HOSPITAL_COMMUNITY): Payer: Self-pay | Admitting: *Deleted

## 2014-07-09 ENCOUNTER — Emergency Department (HOSPITAL_COMMUNITY): Payer: Medicaid Other

## 2014-07-09 ENCOUNTER — Inpatient Hospital Stay (HOSPITAL_COMMUNITY)
Admission: EM | Admit: 2014-07-09 | Discharge: 2014-07-12 | DRG: 291 | Disposition: A | Discharge status: Home / Self Care | Payer: Medicaid Other | Attending: Internal Medicine | Admitting: Internal Medicine

## 2014-07-09 DIAGNOSIS — M1990 Unspecified osteoarthritis, unspecified site: Secondary | ICD-10-CM | POA: Diagnosis present

## 2014-07-09 DIAGNOSIS — Z96652 Presence of left artificial knee joint: Secondary | ICD-10-CM | POA: Diagnosis present

## 2014-07-09 DIAGNOSIS — I509 Heart failure, unspecified: Secondary | ICD-10-CM

## 2014-07-09 DIAGNOSIS — Z7982 Long term (current) use of aspirin: Secondary | ICD-10-CM

## 2014-07-09 DIAGNOSIS — I5043 Acute on chronic combined systolic (congestive) and diastolic (congestive) heart failure: Principal | ICD-10-CM | POA: Diagnosis present

## 2014-07-09 DIAGNOSIS — J441 Chronic obstructive pulmonary disease with (acute) exacerbation: Secondary | ICD-10-CM | POA: Diagnosis present

## 2014-07-09 DIAGNOSIS — G8929 Other chronic pain: Secondary | ICD-10-CM | POA: Diagnosis present

## 2014-07-09 DIAGNOSIS — I10 Essential (primary) hypertension: Secondary | ICD-10-CM | POA: Diagnosis present

## 2014-07-09 DIAGNOSIS — M545 Low back pain: Secondary | ICD-10-CM | POA: Diagnosis present

## 2014-07-09 DIAGNOSIS — Z6841 Body Mass Index (BMI) 40.0 and over, adult: Secondary | ICD-10-CM

## 2014-07-09 DIAGNOSIS — J9621 Acute and chronic respiratory failure with hypoxia: Secondary | ICD-10-CM

## 2014-07-09 DIAGNOSIS — R509 Fever, unspecified: Secondary | ICD-10-CM | POA: Diagnosis present

## 2014-07-09 DIAGNOSIS — Z87440 Personal history of urinary (tract) infections: Secondary | ICD-10-CM

## 2014-07-09 DIAGNOSIS — K5900 Constipation, unspecified: Secondary | ICD-10-CM | POA: Diagnosis present

## 2014-07-09 DIAGNOSIS — Z825 Family history of asthma and other chronic lower respiratory diseases: Secondary | ICD-10-CM

## 2014-07-09 DIAGNOSIS — J9620 Acute and chronic respiratory failure, unspecified whether with hypoxia or hypercapnia: Secondary | ICD-10-CM | POA: Diagnosis present

## 2014-07-09 DIAGNOSIS — J449 Chronic obstructive pulmonary disease, unspecified: Secondary | ICD-10-CM | POA: Diagnosis present

## 2014-07-09 DIAGNOSIS — J40 Bronchitis, not specified as acute or chronic: Secondary | ICD-10-CM

## 2014-07-09 DIAGNOSIS — Z95810 Presence of automatic (implantable) cardiac defibrillator: Secondary | ICD-10-CM

## 2014-07-09 DIAGNOSIS — R0602 Shortness of breath: Secondary | ICD-10-CM

## 2014-07-09 DIAGNOSIS — R000 Tachycardia, unspecified: Secondary | ICD-10-CM | POA: Diagnosis present

## 2014-07-09 DIAGNOSIS — R0600 Dyspnea, unspecified: Secondary | ICD-10-CM | POA: Diagnosis present

## 2014-07-09 DIAGNOSIS — Z79899 Other long term (current) drug therapy: Secondary | ICD-10-CM

## 2014-07-09 DIAGNOSIS — Z9981 Dependence on supplemental oxygen: Secondary | ICD-10-CM

## 2014-07-09 DIAGNOSIS — Z87891 Personal history of nicotine dependence: Secondary | ICD-10-CM

## 2014-07-09 LAB — BASIC METABOLIC PANEL
ANION GAP: 14 (ref 5–15)
BUN: 11 mg/dL (ref 6–23)
CHLORIDE: 104 meq/L (ref 96–112)
CO2: 24 mEq/L (ref 19–32)
CREATININE: 0.94 mg/dL (ref 0.50–1.10)
Calcium: 9.4 mg/dL (ref 8.4–10.5)
GFR calc non Af Amer: 64 mL/min — ABNORMAL LOW (ref >90)
GFR, EST AFRICAN AMERICAN: 74 mL/min — AB (ref >90)
Glucose, Bld: 138 mg/dL — ABNORMAL HIGH (ref 70–99)
POTASSIUM: 3.9 meq/L (ref 3.7–5.3)
Sodium: 142 mEq/L (ref 137–147)

## 2014-07-09 LAB — CBC
HEMATOCRIT: 39 % (ref 36.0–46.0)
Hemoglobin: 12.4 g/dL (ref 12.0–15.0)
MCH: 25.5 pg — ABNORMAL LOW (ref 26.0–34.0)
MCHC: 31.8 g/dL (ref 30.0–36.0)
MCV: 80.1 fL (ref 78.0–100.0)
PLATELETS: 233 10*3/uL (ref 150–400)
RBC: 4.87 MIL/uL (ref 3.87–5.11)
RDW: 15.5 % (ref 11.5–15.5)
WBC: 10.7 10*3/uL — ABNORMAL HIGH (ref 4.0–10.5)

## 2014-07-09 LAB — PRO B NATRIURETIC PEPTIDE: Pro B Natriuretic peptide (BNP): 889 pg/mL — ABNORMAL HIGH (ref 0–125)

## 2014-07-09 LAB — I-STAT TROPONIN, ED: Troponin i, poc: 0 ng/mL (ref 0.00–0.08)

## 2014-07-09 LAB — MAGNESIUM: Magnesium: 1.5 mg/dL (ref 1.5–2.5)

## 2014-07-09 LAB — TROPONIN I

## 2014-07-09 LAB — TSH: TSH: 1.69 u[IU]/mL (ref 0.350–4.500)

## 2014-07-09 MED ORDER — ONDANSETRON HCL 4 MG/2ML IJ SOLN: 4.0000 mg | Freq: Four times a day (QID) | INTRAMUSCULAR | Status: DC | PRN

## 2014-07-09 MED ORDER — SODIUM CHLORIDE 0.9 % IJ SOLN
3.0000 mL | Freq: Two times a day (BID) | INTRAMUSCULAR | Status: DC
  Administered 2014-07-09 – 2014-07-11 (×3): 3 mL via INTRAVENOUS

## 2014-07-09 MED ORDER — FUROSEMIDE 10 MG/ML IJ SOLN
80.0000 mg | Freq: Once | INTRAMUSCULAR | Status: AC
  Administered 2014-07-09: 80 mg via INTRAVENOUS
  Filled 2014-07-09: qty 8

## 2014-07-09 MED ORDER — LISINOPRIL 5 MG PO TABS
5.0000 mg | ORAL_TABLET | Freq: Every day | ORAL | Status: DC
  Administered 2014-07-09 – 2014-07-10 (×2): 5 mg via ORAL
  Filled 2014-07-09 (×2): qty 1

## 2014-07-09 MED ORDER — SODIUM CHLORIDE 0.9 % IJ SOLN: 3.0000 mL | INTRAMUSCULAR | Status: DC | PRN

## 2014-07-09 MED ORDER — ALBUTEROL SULFATE (2.5 MG/3ML) 0.083% IN NEBU
2.5000 mg | INHALATION_SOLUTION | Freq: Four times a day (QID) | RESPIRATORY_TRACT | Status: DC
  Administered 2014-07-09: 2.5 mg via RESPIRATORY_TRACT
  Filled 2014-07-09: qty 3

## 2014-07-09 MED ORDER — FUROSEMIDE 10 MG/ML IJ SOLN
40.0000 mg | Freq: Two times a day (BID) | INTRAMUSCULAR | Status: DC
  Administered 2014-07-09 – 2014-07-10 (×3): 40 mg via INTRAVENOUS
  Filled 2014-07-09 (×3): qty 4

## 2014-07-09 MED ORDER — ALLOPURINOL 100 MG PO TABS
100.0000 mg | ORAL_TABLET | Freq: Every day | ORAL | Status: DC
  Administered 2014-07-09 – 2014-07-12 (×4): 100 mg via ORAL
  Filled 2014-07-09 (×4): qty 1

## 2014-07-09 MED ORDER — ACETAMINOPHEN 325 MG PO TABS
650.0000 mg | ORAL_TABLET | ORAL | Status: DC | PRN
  Administered 2014-07-09 – 2014-07-11 (×3): 650 mg via ORAL
  Filled 2014-07-09 (×3): qty 2

## 2014-07-09 MED ORDER — ALBUTEROL (5 MG/ML) CONTINUOUS INHALATION SOLN
10.0000 mg/h | INHALATION_SOLUTION | Freq: Once | RESPIRATORY_TRACT | Status: AC
  Administered 2014-07-09: 10 mg/h via RESPIRATORY_TRACT
  Filled 2014-07-09: qty 20

## 2014-07-09 MED ORDER — ALBUTEROL SULFATE (2.5 MG/3ML) 0.083% IN NEBU
2.5000 mg | INHALATION_SOLUTION | RESPIRATORY_TRACT | Status: DC | PRN
  Administered 2014-07-10: 2.5 mg via RESPIRATORY_TRACT
  Filled 2014-07-09: qty 3

## 2014-07-09 MED ORDER — IPRATROPIUM BROMIDE 0.02 % IN SOLN
0.5000 mg | Freq: Once | RESPIRATORY_TRACT | Status: AC
  Administered 2014-07-09: 0.5 mg via RESPIRATORY_TRACT
  Filled 2014-07-09: qty 2.5

## 2014-07-09 MED ORDER — HEPARIN SODIUM (PORCINE) 5000 UNIT/ML IJ SOLN
5000.0000 [IU] | Freq: Three times a day (TID) | INTRAMUSCULAR | Status: DC
  Administered 2014-07-10 – 2014-07-12 (×7): 5000 [IU] via SUBCUTANEOUS
  Filled 2014-07-09 (×8): qty 1

## 2014-07-09 MED ORDER — ALBUTEROL SULFATE (2.5 MG/3ML) 0.083% IN NEBU: 2.5000 mg | INHALATION_SOLUTION | Freq: Four times a day (QID) | RESPIRATORY_TRACT | Status: DC

## 2014-07-09 MED ORDER — CETYLPYRIDINIUM CHLORIDE 0.05 % MT LIQD
7.0000 mL | Freq: Two times a day (BID) | OROMUCOSAL | Status: DC
  Administered 2014-07-10 – 2014-07-12 (×5): 7 mL via OROMUCOSAL

## 2014-07-09 MED ORDER — CHLORHEXIDINE GLUCONATE 0.12 % MT SOLN
15.0000 mL | Freq: Two times a day (BID) | OROMUCOSAL | Status: DC
  Administered 2014-07-09 – 2014-07-11 (×5): 15 mL via OROMUCOSAL
  Filled 2014-07-09 (×6): qty 15

## 2014-07-09 MED ORDER — UMECLIDINIUM-VILANTEROL 62.5-25 MCG/INH IN AEPB
1.0000 | INHALATION_SPRAY | Freq: Every day | RESPIRATORY_TRACT | Status: DC
  Administered 2014-07-11: 10:00:00 1 via RESPIRATORY_TRACT

## 2014-07-09 MED ORDER — ASPIRIN EC 81 MG PO TBEC
81.0000 mg | DELAYED_RELEASE_TABLET | Freq: Every day | ORAL | Status: DC
  Administered 2014-07-09 – 2014-07-12 (×4): 81 mg via ORAL
  Filled 2014-07-09 (×4): qty 1

## 2014-07-09 MED ORDER — PRAVASTATIN SODIUM 20 MG PO TABS
20.0000 mg | ORAL_TABLET | Freq: Every day | ORAL | Status: DC
Start: 2014-07-09 — End: 2014-07-12
  Administered 2014-07-09 – 2014-07-12 (×4): 20 mg via ORAL
  Filled 2014-07-09 (×4): qty 1

## 2014-07-09 MED ORDER — PANTOPRAZOLE SODIUM 40 MG PO TBEC
40.0000 mg | DELAYED_RELEASE_TABLET | Freq: Every day | ORAL | Status: DC
  Administered 2014-07-09 – 2014-07-12 (×4): 40 mg via ORAL
  Filled 2014-07-09 (×4): qty 1

## 2014-07-09 MED ORDER — FLUTICASONE PROPIONATE 50 MCG/ACT NA SUSP
1.0000 | Freq: Every day | NASAL | Status: DC
  Administered 2014-07-09 – 2014-07-11 (×3): 1 via NASAL
  Filled 2014-07-09: qty 16

## 2014-07-09 MED ORDER — IPRATROPIUM-ALBUTEROL 0.5-2.5 (3) MG/3ML IN SOLN
3.0000 mL | Freq: Four times a day (QID) | RESPIRATORY_TRACT | Status: DC
  Administered 2014-07-10 (×2): 3 mL via RESPIRATORY_TRACT
  Filled 2014-07-09 (×2): qty 3

## 2014-07-09 MED ORDER — METOPROLOL SUCCINATE ER 100 MG PO TB24
100.0000 mg | ORAL_TABLET | Freq: Every day | ORAL | Status: DC
  Administered 2014-07-09 – 2014-07-12 (×4): 100 mg via ORAL
  Filled 2014-07-09 (×4): qty 1

## 2014-07-09 MED ORDER — SODIUM CHLORIDE 0.9 % IV SOLN: 250.0000 mL | INTRAVENOUS | Status: DC | PRN

## 2014-07-09 NOTE — ED Notes (Signed)
RT called and made aware of order for continuous neb tx

## 2014-07-09 NOTE — ED Provider Notes (Signed)
CSN: [id number]     Arrival date & time 07/09/14  1153 History   First MD Initiated Contact with Patient 07/09/14 1159     Chief Complaint  Patient presents with  . Shortness of Breath     (Consider location/radiation/quality/duration/timing/severity/associated sxs/prior Treatment) Patient is a 63 y.o. female presenting with shortness of breath. The history is provided by the patient.  Shortness of Breath Severity:  Moderate Onset quality:  Gradual Duration:  5 days Timing:  Constant Progression:  Worsening Chronicity:  Recurrent Context: URI   Relieved by:  Nothing Worsened by:  Nothing tried Ineffective treatments:  Inhaler Associated symptoms: cough, fever (102) and sore throat   Associated symptoms: no chest pain and no vomiting     Past Medical History  Diagnosis Date  . CHF (congestive heart failure)   . Hypertension   . COPD (chronic obstructive pulmonary disease)   . Angina   . Dysrhythmia 09/12/11    ventricular paced  . Shortness of breath on exertion   . Chronic lower back pain   . Headache(784.0)   . Dizziness   . Gout   . Hx: UTI (urinary tract infection)   . Cardiac defibrillator in place   . Pacemaker   . Bronchitis, chronic   . Refusal of blood transfusions as patient is Jehovah's Witness   . Arthritis     hx of gout  . Unspecified constipation/ chronic 06/06/2013   Past Surgical History  Procedure Laterality Date  . 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 Joint replacement    . Tubal ligation    . Insert / replace / remove pacemaker  05/2007    w/defibrillator   History reviewed. No pertinent family history. History  Substance Use Topics  . Smoking status: Former Smoker -- 0.50 packs/day for 15 years    Types: Cigarettes  . Smokeless tobacco: Never Used     Comment: quit smoking cigarettes ~ 2007  . Alcohol Use: Yes     Comment: 09/12/11 "rarely drink; not even once a month"   OB History    No data available     Review of  Systems  Constitutional: Positive for fever (102).  HENT: Positive for rhinorrhea and sore throat.   Respiratory: Positive for cough and shortness of breath.   Cardiovascular: Negative for chest pain and leg swelling.  Gastrointestinal: Negative for nausea and vomiting.  All other systems reviewed and are negative.     Allergies  Review of patient's allergies indicates no known allergies.  Home Medications   Prior to Admission medications   Medication Sig Start Date End Date Taking? Authorizing Provider  albuterol (PROVENTIL HFA;VENTOLIN HFA) 108 (90 BASE) MCG/ACT inhaler Inhale 2 puffs into the lungs every 6 (six) hours as needed for wheezing or shortness of breath.    Historical Provider, MD  allopurinol (ZYLOPRIM) 100 MG tablet Take 100 mg by mouth every morning.     Historical Provider, MD  aspirin EC 81 MG tablet Take 81 mg by mouth every morning.     Historical Provider, MD  fluticasone (FLONASE) 50 MCG/ACT nasal spray Place 1 spray into both nostrils daily.    Historical Provider, MD  metoprolol succinate (TOPROL-XL) 50 MG 24 hr tablet Take 1 tablet (50 mg total) by mouth daily. Take with or immediately following a meal. 06/12/13   Nita Sells, MD  omeprazole (PRILOSEC) 20 MG capsule Take 20 mg by mouth daily.     Historical Provider, MD  ondansetron (ZOFRAN ODT) 8 MG disintegrating tablet Take 1 tablet (8 mg total) by mouth every 8 (eight) hours as needed for nausea or vomiting. 12/22/13   Tatyana A Kirichenko, PA-C  permethrin (ELIMITE) 5 % cream Apply 30g neck down, wash off after 8 hrs. 12/22/13   Tatyana A Kirichenko, PA-C  pravastatin (PRAVACHOL) 20 MG tablet Take 20 mg by mouth daily.    Historical Provider, MD  spironolactone (ALDACTONE) 25 MG tablet Take 25 mg by mouth daily.    Historical Provider, MD  torsemide (DEMADEX) 20 MG tablet Take 1 tablet (20 mg total) by mouth daily. 06/12/13   Nita Sells, MD  Umeclidinium-Vilanterol (ANORO ELLIPTA) 62.5-25  MCG/INH AEPB Inhale 1 puff into the lungs daily.    Historical Provider, MD   BP 143/83 mmHg  Pulse 107  Temp(Src) 98.8 F (37.1 C) (Oral)  Resp 26  Ht 5\' 3"  (1.6 m)  Wt 289 lb (131.09 kg)  BMI 51.21 kg/m2  SpO2 98% Physical Exam  Constitutional: She is oriented to person, place, and time. She appears well-developed and well-nourished. No distress.  HENT:  Head: Normocephalic and atraumatic.  Mouth/Throat: Oropharynx is clear and moist.  Eyes: EOM are normal. Pupils are equal, round, and reactive to light.  Neck: Normal range of motion. Neck supple.  Cardiovascular: Regular rhythm.  Tachycardia present.  Exam reveals no friction rub.   No murmur heard. Pulmonary/Chest: Effort normal. No respiratory distress. She has decreased breath sounds. She has wheezes (moderate, diffuse). She has rhonchi (scattered). She has no rales.  Abdominal: Soft. She exhibits no distension. There is no tenderness. There is no rebound.  Musculoskeletal: Normal range of motion. She exhibits no edema.  Neurological: She is alert and oriented to person, place, and time.  Skin: She is not diaphoretic.  Nursing note and vitals reviewed.   ED Course  Procedures (including critical care time) Labs Review Labs Reviewed  CBC  BASIC METABOLIC PANEL  PRO B NATRIURETIC PEPTIDE  I-STAT [place], ED    Imaging Review Dg Chest 2 View  07/09/2014   CLINICAL DATA:  Dizziness and shortness of breath  EXAM: CHEST  2 VIEW  COMPARISON:  06/11/2013  FINDINGS: A defibrillator is again seen. The cardiac shadow is enlarged but stable. The lungs are well aerated bilaterally without focal infiltrate or sizable effusion. No bony abnormality is seen.  IMPRESSION: No active cardiopulmonary disease.   Electronically Signed   By: Inez Catalina M.D.   On: 07/09/2014 14:09     EKG Interpretation   Date/Time:  Thursday July 09 2014 11:57:25 EST Ventricular Rate:  107 PR Interval:  186 QRS Duration: 127 QT Interval:   371 QTC Calculation: 495 R Axis:   -90 Text Interpretation:  Atrial-sensed ventricular-paced rhythm No further  analysis attempted due to paced rhythm Similar to prior Confirmed by  [facility]  MD, [place] ([id number]) on 07/09/2014 12:00:10 PM      MDM   Final diagnoses:  SOB (shortness of breath)  CHF exacerbation  Bronchitis    63 year old female with history of COPD, CHF presenting shortness of breath for the past 5 days. Is on 4 L of home oxygen continuously, has not needed to increase it. MAXIMUM TEMPERATURE 102 this past week, began as a sore throat with upper respiratory drainage and now has developed a cough. Here vitals showed tachycardia, mild tachypnea. She's got decreased air movement with some rhonchi and moderate wheezes diffusely. Will start on a continuous albuterol treatment, give ectropion, check chest  x-ray. EKG similar to prior. Received solumedrol en route. CXR clear. BNP up - given furosemide. Likely bronchitis and CHF exacerbation. Persistent tachycardia. Admitted by Dr. Wendee Beavers.  Evelina Bucy, MD 07/09/14 [phone]

## 2014-07-09 NOTE — Progress Notes (Signed)
  CARE MANAGEMENT ED NOTE 07/09/2014  Patient:  Monique Kane, Monique Kane   Account Number:  [id number]  Date Initiated:  07/09/2014  Documentation initiated by:  Livia Snellen  Subjective/Objective Assessment:   Patient presents to Ed with shortness of breath and cough     Subjective/Objective Assessment Detail:   Patient with pmhx of CHf and COPD.     Action/Plan:   Action/Plan Detail:   Anticipated DC Date:       Status Recommendation to Physician:   Result of Recommendation:    Other ED Services  Consult Working Wilder  CM consult  Other    Choice offered to / List presented to:            Status of service:  Completed, signed off  ED Comments:   ED Comments Detail:  EDCM spoke to patient at bedside.  Patient listed as having Mediciaid insurnace.  Patient reports she lives at home with her five grand children of 8, 69, 38, 7 and 46 years of age.  Patient reports she has home health services with [facility].  Patient reports they come out five days a week for three hours a day.  Patient has a walker, transport chair, shower chair at home.  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  Patient performs her ADL's with assistance. Patient states she is able to make it to the bathroom as it is in her bedroom.  Patient's pcp is located at the [facility] in [place].  Patient reports it has been at least six months since she has seen her pcp.  Patient wears oxygen at home supplied by [employer], 4 liters continuously day and night.  Patient without questions at htis time.  No futher EDCM needsat this time.

## 2014-07-09 NOTE — H&P (Addendum)
[facility] History and Physical  Monique Kane MGQ:[id number] DOB: 10/08/1950 DOA: 07/09/2014  Referring physician: Dr. Mingo Amber PCP: Pcp Not In System   Chief Complaint: Worsening SOB  HPI: Monique Kane is a 63 y.o. female  With history of systolic heart failure, COPDon 4 L of oxygen at home, morbid obesity, hypertension. Who presents to the ED complaining of increased shortness of breath and dyspnea on exertion. She reports that the problem has started 2 days ago. Was initially improving with albuterol but he states he eventually had minimal relief with her rescue inhaler. Given persistence and worsening of problem patient presented to the ED for further evaluation recommendations. She also reports she has been drinking more fluids than usual. Her torsemide is prescribed as a when necessary order although she does report taking torsemide within the last couple days.  While in the ED patient had elevated BNP, chest x-ray reported no active cardiopulmonary disease, patient was given 80 mg of Lasix and albuterol inhaler and we were subsequently consulted for further evaluation recommendations.   Review of Systems:  Constitutional:  No weight loss, night sweats, Fevers, + chills, fatigue.  HEENT:  No headaches, Difficulty swallowing,Tooth/dental problems,Sore throat,  No sneezing, itching, ear ache, nasal congestion, post nasal drip,  Cardio-vascular:  No chest pain, Orthopnea, PND, swelling in lower extremities, anasarca, dizziness, palpitations  GI:  No heartburn, indigestion, abdominal pain, nausea, vomiting, diarrhea, change in bowel habits, loss of appetite  Resp:  + shortness of breath with exertion or at rest. No excess mucus, no productive cough, + non-productive cough, No coughing up of blood.No change in color of mucus.No wheezing.No chest wall deformity  Skin:  no rash or lesions.  GU:  no dysuria, change in color of urine, no urgency or frequency. No flank  pain.  Musculoskeletal:  No joint pain or swelling. No decreased range of motion. No back pain.  Psych:  No change in mood or affect. No depression or anxiety. No memory loss.   Past Medical History  Diagnosis Date  . CHF (congestive heart failure)   . Hypertension   . COPD (chronic obstructive pulmonary disease)   . Angina   . Dysrhythmia 09/12/11    ventricular paced  . Shortness of breath on exertion   . Chronic lower back pain   . Headache(784.0)   . Dizziness   . Gout   . Hx: UTI (urinary tract infection)   . Cardiac defibrillator in place   . Pacemaker   . Bronchitis, chronic   . Refusal of blood transfusions as patient is Jehovah's Witness   . Arthritis     hx of gout  . Unspecified constipation/ chronic 06/06/2013   Past Surgical History  Procedure Laterality Date  . 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 Joint replacement    . Tubal ligation    . Insert / replace / remove pacemaker  05/2007    w/defibrillator   Social History:  reports that she quit smoking about 10 years ago. Her smoking use included Cigarettes. She has a 7.5 pack-year smoking history. She has never used smokeless tobacco. She reports that she drinks alcohol. She reports that she does not use illicit drugs.  No Known Allergies  Addendum: family history: pt reports that mother had COPD  Prior to Admission medications   Medication Sig Start Date End Date Taking? Authorizing Provider  acetaminophen (TYLENOL EX ST ARTHRITIS PAIN) 500 MG tablet Take 1,000-1,500 mg by mouth every 6 (six)  hours as needed for mild pain or headache.   Yes Historical Provider, MD  albuterol (PROVENTIL HFA;VENTOLIN HFA) 108 (90 BASE) MCG/ACT inhaler Inhale 2 puffs into the lungs every 6 (six) hours as needed for wheezing or shortness of breath.   Yes Historical Provider, MD  allopurinol (ZYLOPRIM) 100 MG tablet Take 100 mg by mouth every morning.    Yes Historical Provider, MD  aspirin EC 81 MG tablet Take 81 mg by  mouth every morning.    Yes Historical Provider, MD  fluticasone (FLONASE) 50 MCG/ACT nasal spray Place 1 spray into both nostrils daily.   Yes Historical Provider, MD  metoprolol succinate (TOPROL-XL) 100 MG 24 hr tablet Take 100 mg by mouth daily. Take with or immediately following a meal.   Yes Historical Provider, MD  omeprazole (PRILOSEC) 20 MG capsule Take 20 mg by mouth daily.    Yes Historical Provider, MD  pravastatin (PRAVACHOL) 20 MG tablet Take 20 mg by mouth daily.   Yes Historical Provider, MD  torsemide (DEMADEX) 20 MG tablet Take 1 tablet (20 mg total) by mouth daily. Patient taking differently: Take 20 mg by mouth daily as needed (for fluid).  06/12/13  Yes Nita Sells, MD  Umeclidinium-Vilanterol (ANORO ELLIPTA) 62.5-25 MCG/INH AEPB Inhale 1 puff into the lungs daily.   Yes Historical Provider, MD   Physical Exam: Filed Vitals:   07/09/14 1403 07/09/14 1406 07/09/14 1415 07/09/14 1500  BP:  121/75 127/71 116/92  Pulse: 126 128 125 125  Temp:  97.9 F (36.6 C)    TempSrc:  Oral    Resp: 12 20 22 22   Height:      Weight:      SpO2: 97% 98% 98% 93%    Wt Readings from Last 3 Encounters:  07/09/14 131.09 kg (289 lb)  12/22/13 135.626 kg (299 lb)  10/05/13 135.626 kg (299 lb)    General:  Appears calm and comfortable Eyes: PERRL, normal lids, irises & conjunctiva ENT: grossly normal hearing, lips & tongue Neck: no LAD, masses or thyromegaly Cardiovascular: RRR, no m/r/g. No LE edema. Telemetry: SR, no arrhythmias  Respiratory: patient speaking in fragmented sentences, increased work of breathing, nasal cannula in place, breath sounds auscultated bilaterally, prolonged expiratory phase. Abdomen: soft, nt,nd Skin: increased lower extremity edema Musculoskeletal: grossly normal tone BUE/BLE Psychiatric: grossly normal mood and affect, speech fluent and appropriate Neurologic: grossly non-focal.          Labs on Admission:  Basic Metabolic  Panel:  Recent Labs Lab 07/09/14 1232  NA 142  K 3.9  CL 104  CO2 24  GLUCOSE 138*  BUN 11  CREATININE 0.94  CALCIUM 9.4   Liver Function Tests: No results for input(s): AST, ALT, ALKPHOS, BILITOT, PROT, ALBUMIN in the last 168 hours. No results for input(s): LIPASE, AMYLASE in the last 168 hours. No results for input(s): AMMONIA in the last 168 hours. CBC:  Recent Labs Lab 07/09/14 1232  WBC 10.7*  HGB 12.4  HCT 39.0  MCV 80.1  PLT 233   Cardiac Enzymes: No results for input(s): CKTOTAL, CKMB, CKMBINDEX, TROPONINI in the last 168 hours.  BNP (last 3 results)  Recent Labs  07/09/14 1232  PROBNP 889.0*   CBG: No results for input(s): GLUCAP in the last 168 hours.  Radiological Exams on Admission: Dg Chest 2 View  07/09/2014   CLINICAL DATA:  Dizziness and shortness of breath  EXAM: CHEST  2 VIEW  COMPARISON:  06/11/2013  FINDINGS: A defibrillator  is again seen. The cardiac shadow is enlarged but stable. The lungs are well aerated bilaterally without focal infiltrate or sizable effusion. No bony abnormality is seen.  IMPRESSION: No active cardiopulmonary disease.   Electronically Signed   By: Inez Catalina M.D.   On: 07/09/2014 14:09    Assessment/Plan   Acute exacerbation of CHF (congestive heart failure) - given history most likely cause of worsening shortness of breath. - Patient has history of systolic congestive heart failure. We'll reassess with echocardiogram. - Lasix 40 mg IV twice a day - troponins every 6 hours 3 - Telemetry monitoring  Active Problems:   COPD (chronic obstructive pulmonary disease) - continue home maintenance regimen. - Albuterol every 6 hours scheduled and every 2 hours when necessary    Hypertension - We'll continue beta blocker and add lisinopril and do off principal problem    Acute-on-chronic respiratory failure - most likely secondary to acute systolic congestive heart failure but patient does have history of COPD -  We'll monitor daily and continue supplemental oxygen as needed    Tachycardia - Most likely side effect of continuous albuterol treatment administered in the ED. - We'll monitor on telemetry   Code Status: Full DVT Prophylaxis: Heparin Family Communication: None at bedside Disposition Plan: Telemetry  Time spent: > 50 minutes  Velvet Bathe [facility] Pager [phone]

## 2014-07-09 NOTE — ED Notes (Signed)
Bed: RESA Expected date:  Expected time:  Means of arrival:  Comments: SOB

## 2014-07-09 NOTE — ED Notes (Signed)
Patient transported to X-ray via stretcher Patient in NAD upon leaving for testing

## 2014-07-09 NOTE — Progress Notes (Signed)
Patient alert and oriented, oriented patient to room/unit/hospital and reviewed plan of care with patient, C/O headache PRN pain med given. No wound noted, skin intact and dry. Order to insert foley, patient refused stated she is voiding on her own, notified Dr. Wendee Beavers and portable contacted for bariatric BSC.

## 2014-07-09 NOTE — ED Notes (Signed)
MD at bedside. 

## 2014-07-09 NOTE — ED Notes (Signed)
RT at bedside.

## 2014-07-09 NOTE — ED Notes (Signed)
Patient with c/o SOB x 2 days Patient has hx of COPD and is on 4L [place] at home continuously  Patient called EMS due to symptoms not resolving Patient given Duo neb tx and Solumedrol 125 mg IV en route to ED Patient arrives alert and oriented x 4 DOE noted during ambulation from EMS stretcher to ED stretcher

## 2014-07-09 NOTE — ED Notes (Signed)
Pt started having trouble catching her breath on Sunday accompanied with scratchy throat and it has progressed till today.Today she woke with a headache and even more short of breath with a little dizziness.

## 2014-07-09 NOTE — Plan of Care (Signed)
Problem: Consults Goal: Heart Failure Patient Education (See Patient Education module for education specifics.) Outcome: Completed/Met Date Met:  07/09/14 Goal: Tobacco Cessation referral if indicated Outcome: Not Applicable Date Met:  30/17/20  Problem: Phase I Progression Outcomes Goal: Voiding-avoid urinary catheter unless indicated Outcome: Completed/Met Date Met:  07/09/14

## 2014-07-09 NOTE — ED Notes (Signed)
Patient with multiple complaints: SOB--as previously noted Headache from coughing Sore and scratchy throat from coughing Patient is alert and oriented x 4 When patient arrived, she was talking on her cell phone Patient able to speak in full, complete sentences with SOB noted--handles secretions

## 2014-07-10 DIAGNOSIS — R0600 Dyspnea, unspecified: Secondary | ICD-10-CM | POA: Diagnosis present

## 2014-07-10 DIAGNOSIS — G8929 Other chronic pain: Secondary | ICD-10-CM | POA: Diagnosis present

## 2014-07-10 DIAGNOSIS — M545 Low back pain: Secondary | ICD-10-CM | POA: Diagnosis present

## 2014-07-10 DIAGNOSIS — I369 Nonrheumatic tricuspid valve disorder, unspecified: Secondary | ICD-10-CM

## 2014-07-10 DIAGNOSIS — R000 Tachycardia, unspecified: Secondary | ICD-10-CM | POA: Diagnosis present

## 2014-07-10 DIAGNOSIS — M1990 Unspecified osteoarthritis, unspecified site: Secondary | ICD-10-CM | POA: Diagnosis present

## 2014-07-10 DIAGNOSIS — J441 Chronic obstructive pulmonary disease with (acute) exacerbation: Secondary | ICD-10-CM | POA: Diagnosis present

## 2014-07-10 DIAGNOSIS — Z7982 Long term (current) use of aspirin: Secondary | ICD-10-CM | POA: Diagnosis not present

## 2014-07-10 DIAGNOSIS — J9620 Acute and chronic respiratory failure, unspecified whether with hypoxia or hypercapnia: Secondary | ICD-10-CM | POA: Diagnosis present

## 2014-07-10 DIAGNOSIS — Z6841 Body Mass Index (BMI) 40.0 and over, adult: Secondary | ICD-10-CM | POA: Diagnosis not present

## 2014-07-10 DIAGNOSIS — I10 Essential (primary) hypertension: Secondary | ICD-10-CM | POA: Diagnosis present

## 2014-07-10 DIAGNOSIS — Z87440 Personal history of urinary (tract) infections: Secondary | ICD-10-CM | POA: Diagnosis not present

## 2014-07-10 DIAGNOSIS — R0602 Shortness of breath: Secondary | ICD-10-CM | POA: Diagnosis present

## 2014-07-10 DIAGNOSIS — R509 Fever, unspecified: Secondary | ICD-10-CM | POA: Diagnosis present

## 2014-07-10 DIAGNOSIS — Z87891 Personal history of nicotine dependence: Secondary | ICD-10-CM | POA: Diagnosis not present

## 2014-07-10 DIAGNOSIS — K5900 Constipation, unspecified: Secondary | ICD-10-CM | POA: Diagnosis present

## 2014-07-10 DIAGNOSIS — Z95810 Presence of automatic (implantable) cardiac defibrillator: Secondary | ICD-10-CM | POA: Diagnosis not present

## 2014-07-10 DIAGNOSIS — Z9981 Dependence on supplemental oxygen: Secondary | ICD-10-CM | POA: Diagnosis not present

## 2014-07-10 DIAGNOSIS — Z96652 Presence of left artificial knee joint: Secondary | ICD-10-CM | POA: Diagnosis present

## 2014-07-10 DIAGNOSIS — Z79899 Other long term (current) drug therapy: Secondary | ICD-10-CM | POA: Diagnosis not present

## 2014-07-10 DIAGNOSIS — Z825 Family history of asthma and other chronic lower respiratory diseases: Secondary | ICD-10-CM | POA: Diagnosis not present

## 2014-07-10 DIAGNOSIS — I5043 Acute on chronic combined systolic (congestive) and diastolic (congestive) heart failure: Secondary | ICD-10-CM | POA: Diagnosis present

## 2014-07-10 LAB — BASIC METABOLIC PANEL
Anion gap: 13 (ref 5–15)
BUN: 23 mg/dL (ref 6–23)
CHLORIDE: 105 meq/L (ref 96–112)
CO2: 23 meq/L (ref 19–32)
CREATININE: 1.2 mg/dL — AB (ref 0.50–1.10)
Calcium: 9.1 mg/dL (ref 8.4–10.5)
GFR calc Af Amer: 55 mL/min — ABNORMAL LOW (ref >90)
GFR calc non Af Amer: 47 mL/min — ABNORMAL LOW (ref >90)
Glucose, Bld: 164 mg/dL — ABNORMAL HIGH (ref 70–99)
Potassium: 4.7 mEq/L (ref 3.7–5.3)
Sodium: 141 mEq/L (ref 137–147)

## 2014-07-10 LAB — TROPONIN I: Troponin I: <0.3 ng/mL (ref <0.30)

## 2014-07-10 MED ORDER — GUAIFENESIN ER 600 MG PO TB12
600.0000 mg | ORAL_TABLET | Freq: Two times a day (BID) | ORAL | Status: DC
  Administered 2014-07-10 – 2014-07-12 (×5): 600 mg via ORAL
  Filled 2014-07-10 (×5): qty 1

## 2014-07-10 MED ORDER — IPRATROPIUM-ALBUTEROL 0.5-2.5 (3) MG/3ML IN SOLN
3.0000 mL | RESPIRATORY_TRACT | Status: DC
Start: 2014-07-10 — End: 2014-07-11
  Administered 2014-07-10 – 2014-07-11 (×6): 3 mL via RESPIRATORY_TRACT
  Filled 2014-07-10 (×7): qty 3

## 2014-07-10 MED ORDER — LEVOFLOXACIN 500 MG PO TABS
500.0000 mg | ORAL_TABLET | ORAL | Status: DC
  Administered 2014-07-10 – 2014-07-12 (×3): 500 mg via ORAL
  Filled 2014-07-10 (×3): qty 1

## 2014-07-10 MED ORDER — METHYLPREDNISOLONE SODIUM SUCC 125 MG IJ SOLR
60.0000 mg | Freq: Four times a day (QID) | INTRAMUSCULAR | Status: DC
  Administered 2014-07-10 – 2014-07-11 (×5): 60 mg via INTRAVENOUS
  Filled 2014-07-10 (×6): qty 0.96

## 2014-07-10 NOTE — Progress Notes (Signed)
[facility] PROGRESS NOTE  Monique Kane GYJ:[id number] DOB: Jun 02, 1951 DOA: 07/09/2014 PCP: Pcp Not In System  Assessment/Plan: Acute exacerbation of CHF (congestive heart failure) - given history most likely cause of worsening shortness of breath. - ECHO pending.  - Continue with Lasix 40 mg IV twice a day - troponins every 6 hours 3 negative.    Acute COPD (chronic obstructive pulmonary disease) exacerbation - continue home maintenance regimen. - Albuterol every 4 hours scheduled  -Solumedrol 60 every 6 hours.    Hypertension - We'll continue beta blocker  -Hold lisinopril    Acute-on-chronic respiratory failure - most likely secondary to acute systolic congestive heart failure and COPD   Tachycardia - Most likely side effect of continuous albuterol treatment administered in the ED. - We'll monitor on telemetry  Code Status: Full Code Family Communication: Care discussed with patient,.  Disposition Plan: remain inpatient   Consultants:  none  Procedures:  none  Antibiotics:  Levaquin 11-13  HPI/Subjective: Breathing better but not at baseline.   Objective: Filed Vitals:   07/10/14 1425  BP: 107/57  Pulse: 76  Temp: 98 F (36.7 C)  Resp: 18    Intake/Output Summary (Last 24 hours) at 07/10/14 1740 Last data filed at 07/10/14 1516  Gross per 24 hour  Intake   1080 ml  Output   1050 ml  Net     30 ml   Filed Weights   07/09/14 1158 07/09/14 1714 07/10/14 0515  Weight: 131.09 kg (289 lb) 131.6 kg (290 lb 2 oz) 132.677 kg (292 lb 8 oz)    Exam:   General:  No distress.   Cardiovascular: S 1, S 2 RRR  Respiratory: Bilateral  expiratory wheezing.   Abdomen:BS present, soft  Musculoskeletal: trace edema.    Data Reviewed: Basic Metabolic Panel:  Recent Labs Lab 07/09/14 1232 07/09/14 1755 07/10/14 0506  NA 142  --  141  K 3.9  --  4.7  CL 104  --  105  CO2 24  --  23  GLUCOSE 138*  --  164*  BUN 11  --  23   CREATININE 0.94  --  1.20*  CALCIUM 9.4  --  9.1  MG  --  1.5  --    Liver Function Tests: No results for input(s): AST, ALT, ALKPHOS, BILITOT, PROT, ALBUMIN in the last 168 hours. No results for input(s): LIPASE, AMYLASE in the last 168 hours. No results for input(s): AMMONIA in the last 168 hours. CBC:  Recent Labs Lab 07/09/14 1232  WBC 10.7*  HGB 12.4  HCT 39.0  MCV 80.1  PLT 233   Cardiac Enzymes:  Recent Labs Lab 07/09/14 1755 07/09/14 2300 07/10/14 0506  TROPONINI <0.30 <0.30 <0.30   BNP (last 3 results)  Recent Labs  07/09/14 1232  PROBNP 889.0*   CBG: No results for input(s): GLUCAP in the last 168 hours.  No results found for this or any previous visit (from the past 240 hour(s)).   Studies: Dg Chest 2 View  07/09/2014   CLINICAL DATA:  Dizziness and shortness of breath  EXAM: CHEST  2 VIEW  COMPARISON:  06/11/2013  FINDINGS: A defibrillator is again seen. The cardiac shadow is enlarged but stable. The lungs are well aerated bilaterally without focal infiltrate or sizable effusion. No bony abnormality is seen.  IMPRESSION: No active cardiopulmonary disease.   Electronically Signed   By: Inez Catalina M.D.   On: 07/09/2014 14:09    Scheduled Meds: .  allopurinol  100 mg Oral Daily  . antiseptic oral rinse  7 mL Mouth Rinse q12n4p  . aspirin EC  81 mg Oral Daily  . chlorhexidine  15 mL Mouth Rinse BID  . fluticasone  1 spray Each Nare Daily  . furosemide  40 mg Intravenous BID  . guaiFENesin  600 mg Oral BID  . heparin  5,000 Units Subcutaneous 3 times per day  . ipratropium-albuterol  3 mL Nebulization Q4H  . methylPREDNISolone (SOLU-MEDROL) injection  60 mg Intravenous 4 times per day  . metoprolol succinate  100 mg Oral Daily  . pantoprazole  40 mg Oral Daily  . pravastatin  20 mg Oral Daily  . sodium chloride  3 mL Intravenous Q12H  . Umeclidinium-Vilanterol  1 puff Inhalation Daily   Continuous Infusions:   Active Problems:   COPD  (chronic obstructive pulmonary disease)   Hypertension   Acute-on-chronic respiratory failure   Tachycardia   Acute exacerbation of CHF (congestive heart failure)   Dyspnea    Time spent: 35 minutes.     Niel Hummer A  [facility] Pager [phone]. If 7PM-7AM, please contact night-coverage at [email or web address], password [facility] 07/10/2014, 5:40 PM  LOS: 1 day

## 2014-07-10 NOTE — Plan of Care (Signed)
Problem: Phase I Progression Outcomes Goal: Pain controlled with appropriate interventions Outcome: Completed/Met Date Met:  07/10/14 Goal: EF % per last Echo/documented,Core Reminder form on chart Outcome: Completed/Met Date Met:  07/10/14 Goal: Initial discharge plan identified Outcome: Progressing Goal: Hemodynamically stable Outcome: Progressing

## 2014-07-10 NOTE — Care Management Note (Signed)
    Page 1 of 1   07/10/2014     3:25:28 PM CARE MANAGEMENT NOTE 07/10/2014  Patient:  Monique Kane, Monique Kane   Account Number:  [id number]  Date Initiated:  07/10/2014  Documentation initiated by:  Aleynah Rocchio  Subjective/Objective Assessment:   chf     Action/Plan:   home with hha and heart failure program   Anticipated DC Date:  07/13/2014   Anticipated DC Plan:  [place]         [facility] Choice  Resumption Of Svcs/PTA Provider   Choice offered to / List presented to:          [facility] arranged  [place]      [place].   Status of service:   Medicare Important Message given?   (If response is "NO", the following Medicare IM given date fields will be blank) Date Medicare IM given:   Medicare IM given by:   Date Additional Medicare IM given:   Additional Medicare IM given by:    Discharge Disposition:    Per UR Regulation:    If discussed at Long Length of Stay Meetings, dates discussed:    Comments:  Pt has nurses aide 5 days weekly from "[facility]" Please follow for discharge needs [facility]-will be doing heart failure program upon discharge/Jannie Doyle,RN,BSN,CCM

## 2014-07-10 NOTE — Progress Notes (Signed)
Picked pt up at 3p. Pt complains of headache, PRN tylenol given, pt denies further symptoms at this time. Agree with day RN assessment. Evadene Wardrip A

## 2014-07-10 NOTE — Plan of Care (Signed)
Problem: Phase I Progression Outcomes Goal: Up in chair, BRP Outcome: Completed/Met Date Met:  07/10/14     

## 2014-07-10 NOTE — Progress Notes (Signed)
  Echocardiogram 2D Echocardiogram has been performed.  Darlina Sicilian M 07/10/2014, 9:29 AM

## 2014-07-10 NOTE — Plan of Care (Signed)
Problem: Phase I Progression Outcomes Goal: Dyspnea controlled at rest (HF) Outcome: Completed/Met Date Met:  07/10/14

## 2014-07-11 LAB — BASIC METABOLIC PANEL
Anion gap: 13 (ref 5–15)
BUN: 37 mg/dL — ABNORMAL HIGH (ref 6–23)
CO2: 25 mEq/L (ref 19–32)
Calcium: 9.4 mg/dL (ref 8.4–10.5)
Chloride: 101 mEq/L (ref 96–112)
Creatinine, Ser: 1.38 mg/dL — ABNORMAL HIGH (ref 0.50–1.10)
GFR, EST AFRICAN AMERICAN: 46 mL/min — AB (ref >90)
GFR, EST NON AFRICAN AMERICAN: 40 mL/min — AB (ref >90)
Glucose, Bld: 175 mg/dL — ABNORMAL HIGH (ref 70–99)
POTASSIUM: 4.9 meq/L (ref 3.7–5.3)
SODIUM: 139 meq/L (ref 137–147)

## 2014-07-11 LAB — CBC
HCT: 37.8 % (ref 36.0–46.0)
Hemoglobin: 12.1 g/dL (ref 12.0–15.0)
MCH: 25.3 pg — ABNORMAL LOW (ref 26.0–34.0)
MCHC: 32 g/dL (ref 30.0–36.0)
MCV: 79.1 fL (ref 78.0–100.0)
PLATELETS: 271 10*3/uL (ref 150–400)
RBC: 4.78 MIL/uL (ref 3.87–5.11)
RDW: 15.7 % — ABNORMAL HIGH (ref 11.5–15.5)
WBC: 14.5 10*3/uL — AB (ref 4.0–10.5)

## 2014-07-11 MED ORDER — METHYLPREDNISOLONE SODIUM SUCC 125 MG IJ SOLR
60.0000 mg | Freq: Two times a day (BID) | INTRAMUSCULAR | Status: DC
  Administered 2014-07-11: 60 mg via INTRAVENOUS
  Filled 2014-07-11 (×2): qty 0.96

## 2014-07-11 MED ORDER — IPRATROPIUM-ALBUTEROL 0.5-2.5 (3) MG/3ML IN SOLN
3.0000 mL | Freq: Four times a day (QID) | RESPIRATORY_TRACT | Status: DC
  Administered 2014-07-12 (×2): 3 mL via RESPIRATORY_TRACT
  Filled 2014-07-11 (×2): qty 3

## 2014-07-11 MED ORDER — FUROSEMIDE 10 MG/ML IJ SOLN
40.0000 mg | Freq: Every day | INTRAMUSCULAR | Status: DC
  Administered 2014-07-11 – 2014-07-12 (×2): 40 mg via INTRAVENOUS
  Filled 2014-07-11 (×2): qty 4

## 2014-07-11 NOTE — Progress Notes (Signed)
[facility] PROGRESS NOTE  Monique Kane XLK:[id number] DOB: [birth year] DOA: 07/09/2014 PCP: Pcp Not In System  Assessment/Plan: 63 year old with PMH significant for COPD on 4 L oxygen at home, diastolic dysfunction morbid obese who presents on 11-12 with worsening dyspnea, wheezing lung exam.   Acute exacerbation of CHF (congestive heart failure) - given history most likely cause of worsening shortness of breath. - ECHO diastolic dysfunction grade 1.  -she was started Lasix 40 mg IV twice a day-- change to 40 mg IV daily 11-14 due to increase in cr.  - troponins every 6 hours 3 negative.  -negative 1.1L -Weight: 132----131 kg.   Acute COPD (chronic obstructive pulmonary disease) exacerbation - continue home maintenance regimen. - Albuterol every 4 hours scheduled  -Will change Solumedrol 60 mg to BID.  -Started on Levaquin 11-13. Day 2.    Hypertension - We'll continue beta blocker  -Hold lisinopril in setting of increase Cr.   Acute-on-chronic respiratory failure - most likely secondary to acute systolic congestive heart failure and COPD   Tachycardia; resolved.  - Most likely side effect of continuous albuterol treatment administered in the ED. - We'll monitor on telemetry  Code Status: Full Code Family Communication: Care discussed with patient,.  Disposition Plan: remain inpatient   Consultants:  none  Procedures:  none  Antibiotics:  Levaquin 11-13  HPI/Subjective: She is feeling better, breathing better.    Objective: Filed Vitals:   07/11/14 0548  BP: 130/79  Pulse: 64  Temp: 97.6 F (36.4 C)  Resp: 20    Intake/Output Summary (Last 24 hours) at 07/11/14 1331 Last data filed at 07/11/14 1036  Gross per 24 hour  Intake    963 ml  Output   2200 ml  Net  -1237 ml   Filed Weights   07/09/14 1714 07/10/14 0515 07/11/14 0548  Weight: 131.6 kg (290 lb 2 oz) 132.677 kg (292 lb 8 oz) 131.7 kg (290 lb 5.5 oz)     Exam:   General:  No distress.   Cardiovascular: S 1, S 2 RRR  Respiratory: Bilateral  expiratory wheezing. Less than yesterday.   Abdomen:BS present, soft  Musculoskeletal: trace edema.    Data Reviewed: Basic Metabolic Panel:  Recent Labs Lab 07/09/14 1232 07/09/14 1755 07/10/14 0506 07/11/14 0458  NA 142  --  141 139  K 3.9  --  4.7 4.9  CL 104  --  105 101  CO2 24  --  23 25  GLUCOSE 138*  --  164* 175*  BUN 11  --  23 37*  CREATININE 0.94  --  1.20* 1.38*  CALCIUM 9.4  --  9.1 9.4  MG  --  1.5  --   --    Liver Function Tests: No results for input(s): AST, ALT, ALKPHOS, BILITOT, PROT, ALBUMIN in the last 168 hours. No results for input(s): LIPASE, AMYLASE in the last 168 hours. No results for input(s): AMMONIA in the last 168 hours. CBC:  Recent Labs Lab 07/09/14 1232 07/11/14 0458  WBC 10.7* 14.5*  HGB 12.4 12.1  HCT 39.0 37.8  MCV 80.1 79.1  PLT 233 271   Cardiac Enzymes:  Recent Labs Lab 07/09/14 1755 07/09/14 2300 07/10/14 0506  TROPONINI <0.30 <0.30 <0.30   BNP (last 3 results)  Recent Labs  07/09/14 1232  PROBNP 889.0*   CBG: No results for input(s): GLUCAP in the last 168 hours.  No results found for this or any previous visit (from the past  240 hour(s)).   Studies: Dg Chest 2 View  07/09/2014   CLINICAL DATA:  Dizziness and shortness of breath  EXAM: CHEST  2 VIEW  COMPARISON:  06/11/2013  FINDINGS: A defibrillator is again seen. The cardiac shadow is enlarged but stable. The lungs are well aerated bilaterally without focal infiltrate or sizable effusion. No bony abnormality is seen.  IMPRESSION: No active cardiopulmonary disease.   Electronically Signed   By: Inez Catalina M.D.   On: 07/09/2014 14:09    Scheduled Meds: . allopurinol  100 mg Oral Daily  . antiseptic oral rinse  7 mL Mouth Rinse q12n4p  . aspirin EC  81 mg Oral Daily  . chlorhexidine  15 mL Mouth Rinse BID  . fluticasone  1 spray Each Nare Daily  .  furosemide  40 mg Intravenous Daily  . guaiFENesin  600 mg Oral BID  . heparin  5,000 Units Subcutaneous 3 times per day  . ipratropium-albuterol  3 mL Nebulization Q4H  . levofloxacin  500 mg Oral Q24H  . methylPREDNISolone (SOLU-MEDROL) injection  60 mg Intravenous 4 times per day  . metoprolol succinate  100 mg Oral Daily  . pantoprazole  40 mg Oral Daily  . pravastatin  20 mg Oral Daily  . sodium chloride  3 mL Intravenous Q12H  . Umeclidinium-Vilanterol  1 puff Inhalation Daily   Continuous Infusions:   Active Problems:   COPD (chronic obstructive pulmonary disease)   Hypertension   Acute-on-chronic respiratory failure   Tachycardia   Acute exacerbation of CHF (congestive heart failure)   Dyspnea    Time spent: 35 minutes.     Niel Hummer A  [facility] Pager [phone]. If 7PM-7AM, please contact night-coverage at [email or web address], password [facility] 07/11/2014, 1:31 PM  LOS: 2 days

## 2014-07-12 DIAGNOSIS — J438 Other emphysema: Secondary | ICD-10-CM

## 2014-07-12 LAB — BASIC METABOLIC PANEL
Anion gap: 13 (ref 5–15)
BUN: 40 mg/dL — ABNORMAL HIGH (ref 6–23)
CHLORIDE: 102 meq/L (ref 96–112)
CO2: 27 meq/L (ref 19–32)
Calcium: 9.3 mg/dL (ref 8.4–10.5)
Creatinine, Ser: 1.29 mg/dL — ABNORMAL HIGH (ref 0.50–1.10)
GFR calc non Af Amer: 43 mL/min — ABNORMAL LOW (ref >90)
GFR, EST AFRICAN AMERICAN: 50 mL/min — AB (ref >90)
Glucose, Bld: 154 mg/dL — ABNORMAL HIGH (ref 70–99)
Potassium: 4.6 mEq/L (ref 3.7–5.3)
Sodium: 142 mEq/L (ref 137–147)

## 2014-07-12 LAB — CBC
HEMATOCRIT: 39.6 % (ref 36.0–46.0)
Hemoglobin: 12.8 g/dL (ref 12.0–15.0)
MCH: 25.5 pg — ABNORMAL LOW (ref 26.0–34.0)
MCHC: 32.3 g/dL (ref 30.0–36.0)
MCV: 78.9 fL (ref 78.0–100.0)
Platelets: 294 10*3/uL (ref 150–400)
RBC: 5.02 MIL/uL (ref 3.87–5.11)
RDW: 15.9 % — ABNORMAL HIGH (ref 11.5–15.5)
WBC: 12.7 10*3/uL — AB (ref 4.0–10.5)

## 2014-07-12 MED ORDER — LEVOFLOXACIN 500 MG PO TABS: 500.0000 mg | ORAL_TABLET | ORAL | Status: DC

## 2014-07-12 MED ORDER — PREDNISONE (PAK) 10 MG PO TABS: ORAL_TABLET | Freq: Every day | ORAL | Status: DC

## 2014-07-12 MED ORDER — FUROSEMIDE 40 MG PO TABS: 40.0000 mg | ORAL_TABLET | Freq: Every day | ORAL | Status: DC

## 2014-07-12 NOTE — Discharge Summary (Signed)
Physician Discharge Summary  Monique Kane [id number]:[id number] DOB: [birth year] DOA: 07/09/2014  PCP: Pcp Not In System  Admit date: 07/09/2014 Discharge date: 07/12/2014  Time spent: 45 minutes  Recommendations for Outpatient Follow-up:  -will be discharged home today. -Advised to follow-up with her primary care provider in no more than 2 weeks.   Discharge Diagnoses:  Active Problems:   COPD (chronic obstructive pulmonary disease)   Hypertension   Acute-on-chronic respiratory failure   Tachycardia   Acute exacerbation of CHF (congestive heart failure)   Dyspnea   Discharge Condition: Stable and improved  Filed Weights   07/10/14 0515 07/11/14 0548 07/12/14 0652  Weight: 132.677 kg (292 lb 8 oz) 131.7 kg (290 lb 5.5 oz) 131.6 kg (290 lb 2 oz)    History of present illness:  63 y.o. female  With history of systolic heart failure, COPDon 4 L of oxygen at home, morbid obesity, hypertension. Who presents to the ED complaining of increased shortness of breath and dyspnea on exertion. She reports that the problem has started 2 days ago. Was initially improving with albuterol but he states he eventually had minimal relief with her rescue inhaler. Given persistence and worsening of problem patient presented to the ED for further evaluation recommendations. She also reports she has been drinking more fluids than usual. Her torsemide is prescribed as a when necessary order although she does report taking torsemide within the last couple days.  While in the ED patient had elevated BNP, chest x-ray reported no active cardiopulmonary disease, patient was given 80 mg of Lasix and albuterol inhaler and we were subsequently consulted for further evaluation recommendations.  Hospital Course:  Acute on chronic diastolic CHF -Echo with grade 1 diastolic dysfunction, normal cavity size, normal wall thickness, no quantification of ejection fraction. -is 2.5 L negative since admission, no  longer complaining of shortness of breath and states that her lower extremity edema is at her baseline. -We'll discontinue when necessary torsemide and start Lasix 40 mg daily at home.  COPD with acute exacerbation -Prednisone taper  -Levaquin for 5 more days on discharge. -When necessary albuterol and other nebs as prescribed by primary care provider.  Acute on chronic respiratory failure -Likely secondary to a combination of COPD and CHF. -Please see above for details.  Procedures:  None   Consultations:  None  Discharge Instructions  Discharge Instructions    Diet - low sodium heart healthy    Complete by:  As directed      Increase activity slowly    Complete by:  As directed             Medication List    STOP taking these medications        torsemide 20 MG tablet  Commonly known as:  DEMADEX      TAKE these medications        albuterol 108 (90 BASE) MCG/ACT inhaler  Commonly known as:  PROVENTIL HFA;VENTOLIN HFA  Inhale 2 puffs into the lungs every 6 (six) hours as needed for wheezing or shortness of breath.     allopurinol 100 MG tablet  Commonly known as:  ZYLOPRIM  Take 100 mg by mouth every morning.     ANORO ELLIPTA 62.5-25 MCG/INH Aepb  Generic drug:  Umeclidinium-Vilanterol  Inhale 1 puff into the lungs daily.     aspirin EC 81 MG tablet  Take 81 mg by mouth every morning.     fluticasone 50 MCG/ACT nasal spray  Commonly known  as:  FLONASE  Place 1 spray into both nostrils daily.     furosemide 40 MG tablet  Commonly known as:  LASIX  Take 1 tablet (40 mg total) by mouth daily.     levofloxacin 500 MG tablet  Commonly known as:  LEVAQUIN  Take 1 tablet (500 mg total) by mouth daily.     metoprolol succinate 100 MG 24 hr tablet  Commonly known as:  TOPROL-XL  Take 100 mg by mouth daily. Take with or immediately following a meal.     omeprazole 20 MG capsule  Commonly known as:  PRILOSEC  Take 20 mg by mouth daily.     pravastatin  20 MG tablet  Commonly known as:  PRAVACHOL  Take 20 mg by mouth daily.     predniSONE 10 MG tablet  Commonly known as:  STERAPRED UNI-PAK  Take by mouth daily. Take 6 tablets today and then decrease by 1 daily until none are left     TYLENOL EX ST ARTHRITIS PAIN 500 MG tablet  Generic drug:  acetaminophen  Take 1,000-1,500 mg by mouth every 6 (six) hours as needed for mild pain or headache.       No Known Allergies     Follow-up Information    Follow up with Pcp Not In System In 2 weeks.   Why:  with your regular provider       The results of significant diagnostics from this hospitalization (including imaging, microbiology, ancillary and laboratory) are listed below for reference.    Significant Diagnostic Studies: Dg Chest 2 View  07/09/2014   CLINICAL DATA:  Dizziness and shortness of breath  EXAM: CHEST  2 VIEW  COMPARISON:  06/11/2013  FINDINGS: A defibrillator is again seen. The cardiac shadow is enlarged but stable. The lungs are well aerated bilaterally without focal infiltrate or sizable effusion. No bony abnormality is seen.  IMPRESSION: No active cardiopulmonary disease.   Electronically Signed   By: Inez Catalina M.D.   On: 07/09/2014 14:09    Microbiology: No results found for this or any previous visit (from the past 240 hour(s)).   Labs: Basic Metabolic Panel:  Recent Labs Lab 07/09/14 1232 07/09/14 1755 07/10/14 0506 07/11/14 0458 07/12/14 0537  NA 142  --  141 139 142  K 3.9  --  4.7 4.9 4.6  CL 104  --  105 101 102  CO2 24  --  23 25 27   GLUCOSE 138*  --  164* 175* 154*  BUN 11  --  23 37* 40*  CREATININE 0.94  --  1.20* 1.38* 1.29*  CALCIUM 9.4  --  9.1 9.4 9.3  MG  --  1.5  --   --   --    Liver Function Tests: No results for input(s): AST, ALT, ALKPHOS, BILITOT, PROT, ALBUMIN in the last 168 hours. No results for input(s): LIPASE, AMYLASE in the last 168 hours. No results for input(s): AMMONIA in the last 168 hours. CBC:  Recent  Labs Lab 07/09/14 1232 07/11/14 0458 07/12/14 0537  WBC 10.7* 14.5* 12.7*  HGB 12.4 12.1 12.8  HCT 39.0 37.8 39.6  MCV 80.1 79.1 78.9  PLT 233 271 294   Cardiac Enzymes:  Recent Labs Lab 07/09/14 1755 07/09/14 2300 07/10/14 0506  TROPONINI <0.30 <0.30 <0.30   BNP: BNP (last 3 results)  Recent Labs  07/09/14 1232  PROBNP 889.0*   CBG: No results for input(s): GLUCAP in the last 168 hours.  SignedLelon Frohlich  [facility] Pager: [phone] 07/12/2014, 5:39 PM

## 2014-07-12 NOTE — Plan of Care (Signed)
Problem: Consults Goal: Skin Care Protocol Initiated - if Braden Score 18 or less If consults are not indicated, leave blank or document N/A  Outcome: Completed/Met Date Met:  07/12/14 Goal: Nutrition Consult-if indicated Outcome: Not Applicable Date Met:  89/21/19 Goal: Diabetes Guidelines if Diabetic/Glucose > 140 If diabetic or lab glucose is > 140 mg/dl - Initiate Diabetes/Hyperglycemia Guidelines & Document Interventions  Outcome: Not Applicable Date Met:  41/74/08  Problem: Phase I Progression Outcomes Goal: Initial discharge plan identified Outcome: Completed/Met Date Met:  07/12/14 Goal: Hemodynamically stable Outcome: Completed/Met Date Met:  07/12/14 Goal: Other Phase I Outcomes/Goals Outcome: Completed/Met Date Met:  07/12/14  Problem: Phase II Progression Outcomes Goal: Pain controlled Outcome: Completed/Met Date Met:  07/12/14 Goal: Dyspnea controlled with activity Outcome: Completed/Met Date Met:  07/12/14 Goal: Walk in hall or up in chair TID Outcome: Completed/Met Date Met:  07/12/14 Goal: Tolerating diet Outcome: Completed/Met Date Met:  07/12/14

## 2014-10-08 ENCOUNTER — Encounter (HOSPITAL_COMMUNITY): Payer: Self-pay

## 2014-10-08 ENCOUNTER — Inpatient Hospital Stay (HOSPITAL_COMMUNITY)
Admission: EM | Admit: 2014-10-08 | Discharge: 2014-10-10 | DRG: 194 | Disposition: A | Discharge status: Home / Self Care | Payer: Medicaid Other | Attending: Internal Medicine | Admitting: Internal Medicine

## 2014-10-08 DIAGNOSIS — J449 Chronic obstructive pulmonary disease, unspecified: Secondary | ICD-10-CM | POA: Diagnosis present

## 2014-10-08 DIAGNOSIS — K219 Gastro-esophageal reflux disease without esophagitis: Secondary | ICD-10-CM | POA: Diagnosis present

## 2014-10-08 DIAGNOSIS — I5042 Chronic combined systolic (congestive) and diastolic (congestive) heart failure: Secondary | ICD-10-CM | POA: Diagnosis present

## 2014-10-08 DIAGNOSIS — R5381 Other malaise: Secondary | ICD-10-CM

## 2014-10-08 DIAGNOSIS — I420 Dilated cardiomyopathy: Secondary | ICD-10-CM | POA: Diagnosis present

## 2014-10-08 DIAGNOSIS — M1990 Unspecified osteoarthritis, unspecified site: Secondary | ICD-10-CM | POA: Diagnosis present

## 2014-10-08 DIAGNOSIS — Z6841 Body Mass Index (BMI) 40.0 and over, adult: Secondary | ICD-10-CM

## 2014-10-08 DIAGNOSIS — I10 Essential (primary) hypertension: Secondary | ICD-10-CM | POA: Diagnosis present

## 2014-10-08 DIAGNOSIS — Z79899 Other long term (current) drug therapy: Secondary | ICD-10-CM

## 2014-10-08 DIAGNOSIS — Z95810 Presence of automatic (implantable) cardiac defibrillator: Secondary | ICD-10-CM | POA: Diagnosis present

## 2014-10-08 DIAGNOSIS — J9610 Chronic respiratory failure, unspecified whether with hypoxia or hypercapnia: Secondary | ICD-10-CM | POA: Diagnosis present

## 2014-10-08 DIAGNOSIS — Z7982 Long term (current) use of aspirin: Secondary | ICD-10-CM

## 2014-10-08 DIAGNOSIS — Z96652 Presence of left artificial knee joint: Secondary | ICD-10-CM | POA: Diagnosis present

## 2014-10-08 DIAGNOSIS — M109 Gout, unspecified: Secondary | ICD-10-CM | POA: Diagnosis present

## 2014-10-08 DIAGNOSIS — Y95 Nosocomial condition: Secondary | ICD-10-CM | POA: Diagnosis present

## 2014-10-08 DIAGNOSIS — E785 Hyperlipidemia, unspecified: Secondary | ICD-10-CM | POA: Diagnosis present

## 2014-10-08 DIAGNOSIS — Z9981 Dependence on supplemental oxygen: Secondary | ICD-10-CM

## 2014-10-08 DIAGNOSIS — Z87891 Personal history of nicotine dependence: Secondary | ICD-10-CM

## 2014-10-08 DIAGNOSIS — J189 Pneumonia, unspecified organism: Principal | ICD-10-CM | POA: Diagnosis present

## 2014-10-08 DIAGNOSIS — E662 Morbid (severe) obesity with alveolar hypoventilation: Secondary | ICD-10-CM | POA: Diagnosis present

## 2014-10-08 LAB — CBC WITH DIFFERENTIAL/PLATELET
Basophils Absolute: 0 10*3/uL (ref 0.0–0.1)
Basophils Relative: 0 % (ref 0–1)
EOS PCT: 0 % (ref 0–5)
Eosinophils Absolute: 0 10*3/uL (ref 0.0–0.7)
HEMATOCRIT: 40.2 % (ref 36.0–46.0)
Hemoglobin: 13.6 g/dL (ref 12.0–15.0)
LYMPHS ABS: 1.8 10*3/uL (ref 0.7–4.0)
Lymphocytes Relative: 8 % — ABNORMAL LOW (ref 12–46)
MCH: 25.7 pg — ABNORMAL LOW (ref 26.0–34.0)
MCHC: 33.8 g/dL (ref 30.0–36.0)
MCV: 75.8 fL — AB (ref 78.0–100.0)
MONO ABS: 1.9 10*3/uL — AB (ref 0.1–1.0)
Monocytes Relative: 8 % (ref 3–12)
Neutro Abs: 18.5 10*3/uL — ABNORMAL HIGH (ref 1.7–7.7)
Neutrophils Relative %: 84 % — ABNORMAL HIGH (ref 43–77)
Platelets: 234 10*3/uL (ref 150–400)
RBC: 5.3 MIL/uL — AB (ref 3.87–5.11)
RDW: 15.6 % — AB (ref 11.5–15.5)
WBC: 22.3 10*3/uL — AB (ref 4.0–10.5)

## 2014-10-08 LAB — I-STAT CHEM 8, ED
BUN: 24 mg/dL — ABNORMAL HIGH (ref 6–23)
Calcium, Ion: 1.14 mmol/L (ref 1.13–1.30)
Chloride: 99 mmol/L (ref 96–112)
Creatinine, Ser: 1.2 mg/dL — ABNORMAL HIGH (ref 0.50–1.10)
GLUCOSE: 142 mg/dL — AB (ref 70–99)
HEMATOCRIT: 43 % (ref 36.0–46.0)
Hemoglobin: 14.6 g/dL (ref 12.0–15.0)
POTASSIUM: 3.7 mmol/L (ref 3.5–5.1)
Sodium: 143 mmol/L (ref 135–145)
TCO2: 25 mmol/L (ref 0–100)

## 2014-10-08 MED ORDER — ACETAMINOPHEN 500 MG PO TABS
1000.0000 mg | ORAL_TABLET | Freq: Once | ORAL | Status: AC
  Administered 2014-10-08: 1000 mg via ORAL
  Filled 2014-10-08: qty 2

## 2014-10-08 NOTE — ED Provider Notes (Signed)
CSN: [id number]     Arrival date & time 10/08/14  2239 History  This chart was scribed for Monique Kane K [name], MD by Hilda Lias, ED Scribe. This patient was seen in room B16C/B16C and the patient's care was started at 11:29 PM.    Chief Complaint  Patient presents with  . Chills  . Nausea     Patient is a 64 y.o. female presenting with musculoskeletal pain. The history is provided by the patient. No language interpreter was used.  Muscle Pain This is a new problem. The current episode started 2 days ago. The problem occurs constantly. The problem has not changed since onset.Associated symptoms include shortness of breath. Pertinent negatives include no chest pain and no headaches. Nothing aggravates the symptoms. Nothing relieves the symptoms. She has tried nothing for the symptoms. The treatment provided no relief.     HPI Comments: Monique Kane is a 64 y.o. female with chronic bronchitis who presents to the Emergency Department complaining of chills and nausea with generalized body aches, some nasal congestion and an intermittent subjective fever, and constant sore throat that began two days ago. Pt states that she vomited this morning as well, and states that she has not eaten today at all due to nausea. Pt states that consuming lots of water makes it better, and took a Tylenol this morning with no relief. Pt uses 3L of Oxygen at home.  No urinary symptoms.  Some shortness of breath    Past Medical History  Diagnosis Date  . CHF (congestive heart failure)   . Hypertension   . COPD (chronic obstructive pulmonary disease)   . Angina   . Dysrhythmia 09/12/11    ventricular paced  . Shortness of breath on exertion   . Chronic lower back pain   . Headache(784.0)   . Dizziness   . Gout   . Hx: UTI (urinary tract infection)   . Cardiac defibrillator in place   . Pacemaker   . Bronchitis, chronic   . Refusal of blood transfusions as patient is Jehovah's Witness   .  Arthritis     hx of gout  . Unspecified constipation/ chronic 06/06/2013   Past Surgical History  Procedure Laterality Date  . 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 Joint replacement    . Tubal ligation    . Insert / replace / remove pacemaker  05/2007    w/defibrillator   History reviewed. No pertinent family history. History  Substance Use Topics  . Smoking status: Former Smoker -- 0.50 packs/day for 15 years    Types: Cigarettes    Quit date: 07/09/2004  . Smokeless tobacco: Never Used     Comment: quit smoking cigarettes ~ 2005  . Alcohol Use: No   OB History    No data available     Review of Systems  Constitutional: Positive for fever and chills. Negative for diaphoresis.  HENT: Positive for congestion and sore throat.   Respiratory: Positive for shortness of breath. Negative for cough and wheezing.   Cardiovascular: Negative for chest pain, palpitations and leg swelling.  Gastrointestinal: Positive for nausea and vomiting.  Musculoskeletal: Positive for myalgias.  Neurological: Negative for headaches.  All other systems reviewed and are negative.     Allergies  Review of patient's allergies indicates no known allergies.  Home Medications   Prior to Admission medications   Medication Sig Start Date End Date Taking? Authorizing Provider  acetaminophen (TYLENOL EX ST ARTHRITIS PAIN)  500 MG tablet Take 1,000-1,500 mg by mouth every 6 (six) hours as needed for mild pain or headache.    Historical Provider, MD  albuterol (PROVENTIL HFA;VENTOLIN HFA) 108 (90 BASE) MCG/ACT inhaler Inhale 2 puffs into the lungs every 6 (six) hours as needed for wheezing or shortness of breath.    Historical Provider, MD  allopurinol (ZYLOPRIM) 100 MG tablet Take 100 mg by mouth every morning.     Historical Provider, MD  aspirin EC 81 MG tablet Take 81 mg by mouth every morning.     Historical Provider, MD  fluticasone (FLONASE) 50 MCG/ACT nasal spray Place 1 spray into both  nostrils daily.    Historical Provider, MD  furosemide (LASIX) 40 MG tablet Take 1 tablet (40 mg total) by mouth daily. 07/12/14   Erline Hau, MD  levofloxacin (LEVAQUIN) 500 MG tablet Take 1 tablet (500 mg total) by mouth daily. 07/12/14   Erline Hau, MD  metoprolol succinate (TOPROL-XL) 100 MG 24 hr tablet Take 100 mg by mouth daily. Take with or immediately following a meal.    Historical Provider, MD  omeprazole (PRILOSEC) 20 MG capsule Take 20 mg by mouth daily.     Historical Provider, MD  pravastatin (PRAVACHOL) 20 MG tablet Take 20 mg by mouth daily.    Historical Provider, MD  predniSONE (STERAPRED UNI-PAK) 10 MG tablet Take by mouth daily. Take 6 tablets today and then decrease by 1 daily until none are left 07/12/14   Erline Hau, MD  Umeclidinium-Vilanterol [facility] ELLIPTA) 62.5-25 MCG/INH AEPB Inhale 1 puff into the lungs daily.    Historical Provider, MD   BP 104/68 mmHg  Pulse 99  Temp(Src) 98.7 F (37.1 C) (Oral)  Resp 21  Ht 5\' 3"  (1.6 m)  Wt 283 lb (128.368 kg)  BMI 50.14 kg/m2  SpO2 100% Physical Exam  Constitutional: She is oriented to person, place, and time. She appears well-developed and well-nourished. No distress.  HENT:  Head: Normocephalic and atraumatic.  Mouth/Throat: Oropharynx is clear and moist. No oropharyngeal exudate.  Uvula midline   Eyes: Conjunctivae are normal. Pupils are equal, round, and reactive to light.  Neck: Normal range of motion. Neck supple. No tracheal deviation present.  Cardiovascular: Normal rate, regular rhythm and intact distal pulses.   Pulmonary/Chest: Effort normal and breath sounds normal. No stridor. No respiratory distress. She has no wheezes. She has no rales.  No stridor  Trachea is midline  Abdominal: Soft. Bowel sounds are normal. She exhibits no distension and no mass. There is no tenderness. There is no rebound and no guarding.  Musculoskeletal: Normal range of motion.   Neurological: She is alert and oriented to person, place, and time. She has normal reflexes.  Skin: Skin is warm and dry.  Psychiatric: She has a normal mood and affect.  Nursing note and vitals reviewed.   ED Course  Procedures (including critical care time)  DIAGNOSTIC STUDIES: Oxygen Saturation is 100% on [place], normal by my interpretation.    COORDINATION OF CARE: 11:33 PM Discussed treatment plan with pt at bedside and pt agreed to plan.   Labs Review Labs Reviewed - No data to display  Imaging Review No results found.   EKG Interpretation None      MDM   Final diagnoses:  None    Admit   I personally performed the services described in this documentation, which was scribed in my presence. The recorded information has been reviewed and  is accurate.     Carlisle Beers, MD 10/09/14 [phone]

## 2014-10-08 NOTE — ED Notes (Signed)
MD at bedside. 

## 2014-10-08 NOTE — ED Notes (Signed)
Per EMS, pt having chills, body aches, nausea. Pt vomited this morning and has not eaten today due to nausea. Pt took tylenol this am without relief. Pt stable and and no acute distress upon arrival. Pt wears 3L [place] at home all the time.

## 2014-10-09 ENCOUNTER — Emergency Department (HOSPITAL_COMMUNITY): Payer: Medicaid Other

## 2014-10-09 ENCOUNTER — Encounter (HOSPITAL_COMMUNITY): Payer: Self-pay | Admitting: Emergency Medicine

## 2014-10-09 DIAGNOSIS — Y95 Nosocomial condition: Secondary | ICD-10-CM | POA: Diagnosis present

## 2014-10-09 DIAGNOSIS — Z9981 Dependence on supplemental oxygen: Secondary | ICD-10-CM | POA: Diagnosis not present

## 2014-10-09 DIAGNOSIS — Z95810 Presence of automatic (implantable) cardiac defibrillator: Secondary | ICD-10-CM

## 2014-10-09 DIAGNOSIS — R5381 Other malaise: Secondary | ICD-10-CM | POA: Diagnosis present

## 2014-10-09 DIAGNOSIS — J449 Chronic obstructive pulmonary disease, unspecified: Secondary | ICD-10-CM | POA: Diagnosis present

## 2014-10-09 DIAGNOSIS — I420 Dilated cardiomyopathy: Secondary | ICD-10-CM | POA: Diagnosis present

## 2014-10-09 DIAGNOSIS — Z96652 Presence of left artificial knee joint: Secondary | ICD-10-CM | POA: Diagnosis present

## 2014-10-09 DIAGNOSIS — K219 Gastro-esophageal reflux disease without esophagitis: Secondary | ICD-10-CM | POA: Diagnosis present

## 2014-10-09 DIAGNOSIS — Z6841 Body Mass Index (BMI) 40.0 and over, adult: Secondary | ICD-10-CM | POA: Diagnosis not present

## 2014-10-09 DIAGNOSIS — Z7982 Long term (current) use of aspirin: Secondary | ICD-10-CM | POA: Diagnosis not present

## 2014-10-09 DIAGNOSIS — I10 Essential (primary) hypertension: Secondary | ICD-10-CM

## 2014-10-09 DIAGNOSIS — Z87891 Personal history of nicotine dependence: Secondary | ICD-10-CM | POA: Diagnosis not present

## 2014-10-09 DIAGNOSIS — Z79899 Other long term (current) drug therapy: Secondary | ICD-10-CM | POA: Diagnosis not present

## 2014-10-09 DIAGNOSIS — J9610 Chronic respiratory failure, unspecified whether with hypoxia or hypercapnia: Secondary | ICD-10-CM | POA: Diagnosis present

## 2014-10-09 DIAGNOSIS — M109 Gout, unspecified: Secondary | ICD-10-CM | POA: Diagnosis present

## 2014-10-09 DIAGNOSIS — J189 Pneumonia, unspecified organism: Secondary | ICD-10-CM | POA: Diagnosis not present

## 2014-10-09 DIAGNOSIS — I5042 Chronic combined systolic (congestive) and diastolic (congestive) heart failure: Secondary | ICD-10-CM | POA: Diagnosis present

## 2014-10-09 DIAGNOSIS — J438 Other emphysema: Secondary | ICD-10-CM

## 2014-10-09 DIAGNOSIS — E785 Hyperlipidemia, unspecified: Secondary | ICD-10-CM | POA: Diagnosis present

## 2014-10-09 DIAGNOSIS — M1990 Unspecified osteoarthritis, unspecified site: Secondary | ICD-10-CM | POA: Diagnosis present

## 2014-10-09 DIAGNOSIS — E662 Morbid (severe) obesity with alveolar hypoventilation: Secondary | ICD-10-CM | POA: Diagnosis present

## 2014-10-09 LAB — URINALYSIS, ROUTINE W REFLEX MICROSCOPIC
BILIRUBIN URINE: NEGATIVE
GLUCOSE, UA: NEGATIVE mg/dL
Hgb urine dipstick: NEGATIVE
Ketones, ur: NEGATIVE mg/dL
Leukocytes, UA: NEGATIVE
Nitrite: NEGATIVE
PH: 6 (ref 5.0–8.0)
Protein, ur: NEGATIVE mg/dL
SPECIFIC GRAVITY, URINE: 1.015 (ref 1.005–1.030)
Urobilinogen, UA: 0.2 mg/dL (ref 0.0–1.0)

## 2014-10-09 LAB — STREP PNEUMONIAE URINARY ANTIGEN: Strep Pneumo Urinary Antigen: NEGATIVE

## 2014-10-09 LAB — INFLUENZA PANEL BY PCR (TYPE A & B)
H1N1 flu by pcr: NOT DETECTED
INFLAPCR: NEGATIVE
Influenza B By PCR: NEGATIVE

## 2014-10-09 LAB — LACTIC ACID, PLASMA: LACTIC ACID, VENOUS: 1.4 mmol/L (ref 0.5–2.0)

## 2014-10-09 MED ORDER — FLUTICASONE PROPIONATE 50 MCG/ACT NA SUSP
1.0000 | Freq: Every day | NASAL | Status: DC
  Filled 2014-10-09: qty 16

## 2014-10-09 MED ORDER — DEXTROSE 5 % IV SOLN
1.0000 g | INTRAVENOUS | Status: DC
  Administered 2014-10-09 – 2014-10-10 (×2): 1 g via INTRAVENOUS
  Filled 2014-10-09 (×3): qty 10

## 2014-10-09 MED ORDER — ALBUTEROL SULFATE (2.5 MG/3ML) 0.083% IN NEBU: 3.0000 mL | INHALATION_SOLUTION | Freq: Four times a day (QID) | RESPIRATORY_TRACT | Status: DC | PRN

## 2014-10-09 MED ORDER — DEXTROSE 5 % IV SOLN
500.0000 mg | INTRAVENOUS | Status: DC
  Administered 2014-10-09 – 2014-10-10 (×2): 500 mg via INTRAVENOUS
  Filled 2014-10-09 (×3): qty 500

## 2014-10-09 MED ORDER — PANTOPRAZOLE SODIUM 40 MG PO TBEC
40.0000 mg | DELAYED_RELEASE_TABLET | Freq: Every day | ORAL | Status: DC
  Administered 2014-10-09 – 2014-10-10 (×2): 40 mg via ORAL
  Filled 2014-10-09 (×2): qty 1

## 2014-10-09 MED ORDER — UMECLIDINIUM-VILANTEROL 62.5-25 MCG/INH IN AEPB
1.0000 | INHALATION_SPRAY | Freq: Every day | RESPIRATORY_TRACT | Status: DC
  Administered 2014-10-09: 1 via RESPIRATORY_TRACT

## 2014-10-09 MED ORDER — ALLOPURINOL 100 MG PO TABS
100.0000 mg | ORAL_TABLET | ORAL | Status: DC
  Administered 2014-10-09 – 2014-10-10 (×2): 100 mg via ORAL
  Filled 2014-10-09 (×3): qty 1

## 2014-10-09 MED ORDER — ASPIRIN-ACETAMINOPHEN-CAFFEINE 250-250-65 MG PO TABS
1.0000 | ORAL_TABLET | Freq: Four times a day (QID) | ORAL | Status: DC | PRN
  Filled 2014-10-09: qty 1

## 2014-10-09 MED ORDER — ACETAMINOPHEN 325 MG PO TABS
650.0000 mg | ORAL_TABLET | Freq: Four times a day (QID) | ORAL | Status: DC | PRN
  Administered 2014-10-09 – 2014-10-10 (×5): 650 mg via ORAL
  Filled 2014-10-09 (×5): qty 2

## 2014-10-09 MED ORDER — ALBUTEROL SULFATE HFA 108 (90 BASE) MCG/ACT IN AERS: 2.0000 | INHALATION_SPRAY | Freq: Four times a day (QID) | RESPIRATORY_TRACT | Status: DC | PRN

## 2014-10-09 MED ORDER — FUROSEMIDE 40 MG PO TABS
40.0000 mg | ORAL_TABLET | Freq: Every day | ORAL | Status: DC
  Administered 2014-10-09 – 2014-10-10 (×2): 40 mg via ORAL
  Filled 2014-10-09 (×2): qty 1

## 2014-10-09 MED ORDER — PRAVASTATIN SODIUM 20 MG PO TABS
20.0000 mg | ORAL_TABLET | Freq: Every day | ORAL | Status: DC
  Administered 2014-10-09: 20 mg via ORAL
  Filled 2014-10-09 (×2): qty 1

## 2014-10-09 MED ORDER — PIPERACILLIN-TAZOBACTAM 3.375 G IVPB 30 MIN
3.3750 g | Freq: Once | INTRAVENOUS | Status: AC
  Administered 2014-10-09: 3.375 g via INTRAVENOUS
  Filled 2014-10-09: qty 50

## 2014-10-09 MED ORDER — VANCOMYCIN HCL IN DEXTROSE 1-5 GM/200ML-% IV SOLN
1000.0000 mg | Freq: Once | INTRAVENOUS | Status: AC
  Administered 2014-10-09: 1000 mg via INTRAVENOUS
  Filled 2014-10-09: qty 200

## 2014-10-09 MED ORDER — HEPARIN SODIUM (PORCINE) 5000 UNIT/ML IJ SOLN
5000.0000 [IU] | Freq: Three times a day (TID) | INTRAMUSCULAR | Status: DC
  Administered 2014-10-09 – 2014-10-10 (×3): 5000 [IU] via SUBCUTANEOUS
  Filled 2014-10-09 (×4): qty 1

## 2014-10-09 MED ORDER — VANCOMYCIN HCL IN DEXTROSE 1-5 GM/200ML-% IV SOLN
1000.0000 mg | Freq: Two times a day (BID) | INTRAVENOUS | Status: DC
Start: 2014-10-09 — End: 2014-10-10
  Administered 2014-10-09 – 2014-10-10 (×3): 1000 mg via INTRAVENOUS
  Filled 2014-10-09 (×5): qty 200

## 2014-10-09 MED ORDER — SODIUM CHLORIDE 0.9 % IV SOLN
1500.0000 mg | Freq: Once | INTRAVENOUS | Status: AC
  Administered 2014-10-09: 1500 mg via INTRAVENOUS
  Filled 2014-10-09: qty 1500

## 2014-10-09 MED ORDER — ASPIRIN EC 81 MG PO TBEC
81.0000 mg | DELAYED_RELEASE_TABLET | ORAL | Status: DC
  Administered 2014-10-09 – 2014-10-10 (×2): 81 mg via ORAL
  Filled 2014-10-09 (×3): qty 1

## 2014-10-09 MED ORDER — BUDESONIDE 0.25 MG/2ML IN SUSP
0.2500 mg | Freq: Two times a day (BID) | RESPIRATORY_TRACT | Status: DC
  Administered 2014-10-10: 0.25 mg via RESPIRATORY_TRACT
  Filled 2014-10-09 (×4): qty 2

## 2014-10-09 MED ORDER — METOPROLOL SUCCINATE ER 100 MG PO TB24
100.0000 mg | ORAL_TABLET | Freq: Every day | ORAL | Status: DC
  Administered 2014-10-09 – 2014-10-10 (×2): 100 mg via ORAL
  Filled 2014-10-09 (×2): qty 1

## 2014-10-09 NOTE — Progress Notes (Signed)
Patient seena nd examined. Admitted after midnight due to SOB and cough. Found to have PNA; was recently in the hospital and has been admitted for treatment of HCAP. No CP, no fever this morning. Feeling slightly better. Please referred to H&P by Dr. Posey Pronto for further info/details on admission.  Plan: -Will add pulmicort to her regimen (slightly wheezing on exam -start flutter valve -d/c telemetry   Barton Dubois [phone]

## 2014-10-09 NOTE — Progress Notes (Signed)
Utilization review completed.  

## 2014-10-09 NOTE — Progress Notes (Signed)
ANTIBIOTIC CONSULT NOTE - INITIAL  Pharmacy Consult for vancomycin Indication: rule out pneumonia  No Known Allergies  Patient Measurements: Height: 5\' 3"  (160 cm) Weight: 283 lb (128.368 kg) IBW/kg (Calculated) : 52.4  Vital Signs: Temp: 98.5 F (36.9 C) (02/11 2330) Temp Source: Oral (02/11 2330) BP: 112/63 mmHg (02/12 0333) Pulse Rate: 84 (02/12 0333)  Labs:  Recent Labs  10/08/14 2344 10/08/14 2354  WBC 22.3*  --   HGB 13.6 14.6  PLT 234  --   CREATININE  --  1.20*   Estimated Creatinine Clearance: 63.5 mL/min (by C-G formula based on Cr of 1.2).   Medical History: Past Medical History  Diagnosis Date  . CHF (congestive heart failure)   . Hypertension   . COPD (chronic obstructive pulmonary disease)   . Angina   . Dysrhythmia 09/12/11    ventricular paced  . Shortness of breath on exertion   . Chronic lower back pain   . Headache(784.0)   . Dizziness   . Gout   . Hx: UTI (urinary tract infection)   . Cardiac defibrillator in place   . Pacemaker   . Bronchitis, chronic   . Refusal of blood transfusions as patient is Jehovah's Witness   . Arthritis     hx of gout  . Unspecified constipation/ chronic 06/06/2013    Medications:   (Not in a hospital admission) Scheduled:  . allopurinol  100 mg Oral BH-q7a  . aspirin EC  81 mg Oral BH-q7a  . fluticasone  1 spray Each Nare Daily  . furosemide  40 mg Oral Daily  . heparin  5,000 Units Subcutaneous 3 times per day  . metoprolol succinate  100 mg Oral Daily  . pantoprazole  40 mg Oral Daily  . pravastatin  20 mg Oral Daily  . Umeclidinium-Vilanterol  1 puff Inhalation Daily   Infusions:  . azithromycin    . cefTRIAXone (ROCEPHIN)  IV      Assessment: 64yo female c/o chills, SOB, body aches, sore throat, and N/V, CXR could reflect PNA, to begin IV ABX.  Goal of Therapy:  Vancomycin trough level 15-20 mcg/ml  Plan:  Rec'd vanc 1g in ED; will give additional vancomycin 1500mg  IV for total load  of 2.5g then begin 1000mg  IV Q12H and monitor CBC, Cx, levels prn.  Wynona Neat, PharmD, BCPS  10/09/2014,4:43 AM

## 2014-10-09 NOTE — H&P (Signed)
[facility] History and Physical  Patient: Monique Kane  MRN: [id number]  DOB: 01-23-1951  DOS: the patient was seen and examined on 10/09/2014 PCP: Pcp Not In System  Chief Complaint: Cough and shortness of breath  HPI: Monique Kane is a 64 y.o. female with Past medical history of COPD, chronic combined systolic and diastolic heart failure, ICD implant, chronic respiratory failure on 4 L of oxygen, hypertension. The patient presented with complaints of cough and shortness of breath along with subjective fever and chills ongoing for last 2 days he did she mentions she initially started with a runny nose and later on had a cough which is nonproductive. She denies any pain in the chest. She felt nauseous and had an episode of vomiting earlier in the morning. She denies any diarrhea or constipation or abdominal pain. She denies any burning urination. Denies any orthopnea PND or leg swelling. Denies any recent change in her medication. Denies any active smoking has in smoking in the past. One of his granddaughter was also having upper respiratory symptoms.  The patient is coming from home. And at her baseline independent for most of her ADL.  Review of Systems: as mentioned in the history of present illness.  A Comprehensive review of the other systems is negative.  Past Medical History  Diagnosis Date  . CHF (congestive heart failure)   . Hypertension   . COPD (chronic obstructive pulmonary disease)   . Angina   . Dysrhythmia 09/12/11    ventricular paced  . Shortness of breath on exertion   . Chronic lower back pain   . Headache(784.0)   . Dizziness   . Gout   . Hx: UTI (urinary tract infection)   . Cardiac defibrillator in place   . Pacemaker   . Bronchitis, chronic   . Refusal of blood transfusions as patient is Jehovah's Witness   . Arthritis     hx of gout  . Unspecified constipation/ chronic 06/06/2013   Past Surgical History  Procedure Laterality Date  .  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 Joint replacement    . Tubal ligation    . Insert / replace / remove pacemaker  05/2007    w/defibrillator   Social History:  reports that she quit smoking about 10 years ago. Her smoking use included Cigarettes. She has a 7.5 pack-year smoking history. She has never used smokeless tobacco. She reports that she does not drink alcohol or use illicit drugs.  No Known Allergies  History reviewed. No pertinent family history.  Prior to Admission medications   Medication Sig Start Date End Date Taking? Authorizing Provider  acetaminophen (TYLENOL EX ST ARTHRITIS PAIN) 500 MG tablet Take 1,000-1,500 mg by mouth every 6 (six) hours as needed for mild pain or headache.   Yes Historical Provider, MD  albuterol (PROVENTIL HFA;VENTOLIN HFA) 108 (90 BASE) MCG/ACT inhaler Inhale 2 puffs into the lungs every 6 (six) hours as needed for wheezing or shortness of breath.   Yes Historical Provider, MD  allopurinol (ZYLOPRIM) 100 MG tablet Take 100 mg by mouth every morning.    Yes Historical Provider, MD  aspirin EC 81 MG tablet Take 81 mg by mouth every morning.    Yes Historical Provider, MD  fluticasone (FLONASE) 50 MCG/ACT nasal spray Place 1 spray into both nostrils daily.   Yes Historical Provider, MD  furosemide (LASIX) 40 MG tablet Take 1 tablet (40 mg total) by mouth daily. 07/12/14  Yes  Erline Hau, MD  metoprolol succinate (TOPROL-XL) 100 MG 24 hr tablet Take 100 mg by mouth daily. Take with or immediately following a meal.   Yes Historical Provider, MD  omeprazole (PRILOSEC) 20 MG capsule Take 20 mg by mouth daily.    Yes Historical Provider, MD  pravastatin (PRAVACHOL) 20 MG tablet Take 20 mg by mouth daily.   Yes Historical Provider, MD  Umeclidinium-Vilanterol (ANORO ELLIPTA) 62.5-25 MCG/INH AEPB Inhale 1 puff into the lungs daily.   Yes Historical Provider, MD  levofloxacin (LEVAQUIN) 500 MG tablet Take 1 tablet (500 mg total) by mouth  daily. Patient not taking: Reported on 10/09/2014 07/12/14   Erline Hau, MD  predniSONE (STERAPRED UNI-PAK) 10 MG tablet Take by mouth daily. Take 6 tablets today and then decrease by 1 daily until none are left Patient not taking: Reported on 10/09/2014 07/12/14   Erline Hau, MD    Physical Exam: Filed Vitals:   10/09/14 0400 10/09/14 0445 10/09/14 0500 10/09/14 0545  BP: 105/67 111/82 106/64 128/78  Pulse: 84 83 82 94  Temp:    98.6 F (37 C)  TempSrc:    Oral  Resp: 20 18 19 23   Height:      Weight:      SpO2: 98% 95% 99% 100%    General: Alert, Awake and Oriented to Time, Place and Person. Appear in mild distress Eyes: PERRL ENT: Oral Mucosa clear moist. Neck: no JVD Cardiovascular: S1 and S2 Present, no Murmur, Peripheral Pulses Present Respiratory: Bilateral Air entry equal and Decreased, faint Crackles, no wheezes Abdomen: Bowel Sound present, Soft and non tender Skin: no Rash Extremities: Trace Pedal edema, no calf tenderness Neurologic: Grossly no focal neuro deficit.  Labs on Admission:  CBC:  Recent Labs Lab 10/08/14 2344 10/08/14 2354  WBC 22.3*  --   NEUTROABS 18.5*  --   HGB 13.6 14.6  HCT 40.2 43.0  MCV 75.8*  --   PLT 234  --     CMP     Component Value Date/Time   NA 143 10/08/2014 2354   K 3.7 10/08/2014 2354   CL 99 10/08/2014 2354   CO2 27 07/12/2014 0537   GLUCOSE 142* 10/08/2014 2354   BUN 24* 10/08/2014 2354   CREATININE 1.20* 10/08/2014 2354   CALCIUM 9.3 07/12/2014 0537   PROT 8.6* 12/22/2013 1344   ALBUMIN 3.6 12/22/2013 1344   AST 26 12/22/2013 1344   ALT 29 12/22/2013 1344   ALKPHOS 116 12/22/2013 1344   BILITOT 0.3 12/22/2013 1344   GFRNONAA 43* 07/12/2014 0537   GFRAA 50* 07/12/2014 0537    No results for input(s): LIPASE, AMYLASE in the last 168 hours.  No results for input(s): CKTOTAL, CKMB, CKMBINDEX, TROPONINI in the last 168 hours. BNP (last 3 results) No results for input(s): BNP in  the last 8760 hours.  ProBNP (last 3 results)  Recent Labs  07/09/14 1232  PROBNP 889.0*     Radiological Exams on Admission: Dg Chest 2 View  10/09/2014   CLINICAL DATA:  Tiredness and weakness. Dry cough and shortness of breath. Initial encounter.  EXAM: CHEST  2 VIEW  COMPARISON:  Chest radiograph performed 07/09/2014  FINDINGS: The lungs are well-aerated. Mild vascular congestion is noted. Mild left basilar airspace opacity could reflect pneumonia, given the patient's symptoms. No pleural effusion or pneumothorax is seen.  The heart is mildly enlarged. A pacemaker/AICD is noted at the left chest wall, with leads ending  at the right atrium, right ventricle and possibly the coronary sinus. No acute osseous abnormalities are seen.  IMPRESSION: Mild vascular congestion and mild cardiomegaly noted. Mild left basilar airspace opacity could reflect pneumonia, given the patient's symptoms.   Electronically Signed   By: Garald Balding M.D.   On: 10/09/2014 01:15    Assessment/Plan Principal Problem:   CAP (community acquired pneumonia) Active Problems:   Dilated cardiomyopathy   COPD (chronic obstructive pulmonary disease)   Hypertension   Cardiac defibrillator in place   Morbid obesity   1. CAP (community acquired pneumonia) The patient is presenting with complaints of cough. Symptoms onset has been ongoing for 2 days. Chest x-ray shows pneumonia and she has leukocytosis. With this the patient will be admitted in the hospital on telemetry. I would continue her with ceftriaxone azithromycin and would also add vancomycin due to her being 90 days within recent hospitalization. Follow cultures.  2. Dilated cardiomyopathy, hypertension. Continue home medications at present. Patient does not appear volume overloaded.  3. COPD. Continue home medication does not appear to be in any acute exacerbation.  4. GERD. Continuing PPI at home doses.  Advance goals of care discussion: Full code    DVT Prophylaxis: subcutaneous Heparin Nutrition: Cardiac diet  Disposition: Admitted to inpatient in telemetry unit.  Author: Berle Mull, MD [facility] Pager: [phone] 10/09/2014, 5:53 AM    If 7PM-7AM, please contact night-coverage [email or web address] Password [email or web address]

## 2014-10-09 NOTE — Progress Notes (Signed)
Home meds at bedside taken by PT

## 2014-10-10 LAB — CBC
HCT: 35.2 % — ABNORMAL LOW (ref 36.0–46.0)
Hemoglobin: 11.7 g/dL — ABNORMAL LOW (ref 12.0–15.0)
MCH: 25.4 pg — ABNORMAL LOW (ref 26.0–34.0)
MCHC: 33.2 g/dL (ref 30.0–36.0)
MCV: 76.5 fL — ABNORMAL LOW (ref 78.0–100.0)
Platelets: 231 10*3/uL (ref 150–400)
RBC: 4.6 MIL/uL (ref 3.87–5.11)
RDW: 15.5 % (ref 11.5–15.5)
WBC: 21.2 10*3/uL — ABNORMAL HIGH (ref 4.0–10.5)

## 2014-10-10 LAB — BASIC METABOLIC PANEL
Anion gap: 11 (ref 5–15)
BUN: 13 mg/dL (ref 6–23)
CO2: 26 mmol/L (ref 19–32)
Calcium: 8.5 mg/dL (ref 8.4–10.5)
Chloride: 100 mmol/L (ref 96–112)
Creatinine, Ser: 1.18 mg/dL — ABNORMAL HIGH (ref 0.50–1.10)
GFR calc non Af Amer: 48 mL/min — ABNORMAL LOW (ref >90)
GFR, EST AFRICAN AMERICAN: 56 mL/min — AB (ref >90)
Glucose, Bld: 149 mg/dL — ABNORMAL HIGH (ref 70–99)
POTASSIUM: 3.9 mmol/L (ref 3.5–5.1)
Sodium: 137 mmol/L (ref 135–145)

## 2014-10-10 MED ORDER — AMOXICILLIN-POT CLAVULANATE 875-125 MG PO TABS: 1.0000 | ORAL_TABLET | Freq: Two times a day (BID) | ORAL | Status: DC

## 2014-10-10 MED ORDER — INFLUENZA VAC SPLIT QUAD 0.5 ML IM SUSY
0.5000 mL | PREFILLED_SYRINGE | INTRAMUSCULAR | Status: AC
  Administered 2014-10-10: 0.5 mL via INTRAMUSCULAR
  Filled 2014-10-10 (×2): qty 0.5

## 2014-10-10 NOTE — Progress Notes (Addendum)
Before hanging the Vancomycin patient started complaining of pain with her IV. I did not start the antibiotic and asked the charge nurse Randol Kern, RN) to attempt a restart for the Vancomycin. When I came back to the room Jeneen Rinks had started the medication at a rate of 110 ml/hr and patient stated that it was not hurting. Patient called up to the front desk and stated that her hand and arm was swelling. I discontinued the medication and took the IV out. Informed patient that the medication was not infusing into the vein it was going into the skin. Made the patient aware of what signs and symptoms to be on the look out for. Patient stated that it was not hurting. I will notify the MD that the patient did not receive all of the medication. Glade Nurse, RN

## 2014-10-10 NOTE — Discharge Summary (Signed)
Physician Discharge Summary  Monique Kane QIO:[id number] DOB: 01/28/1951 DOA: 10/08/2014  PCP: Pcp Not In System  Admit date: 10/08/2014 Discharge date: 10/10/2014  Time spent: >30 minutes  Recommendations for Outpatient Follow-up:  Close follow up of BMET to check electrolytes and renal function Repeat CXR in 3 weeks to ensure resolution of infiltrate Reassess BP and adjust medications as needed  Discharge Diagnoses:  Principal Problem:   CAP (community acquired pneumonia) Active Problems:   Dilated cardiomyopathy   COPD (chronic obstructive pulmonary disease)   Hypertension   Cardiac defibrillator in place   Morbid obesity   Discharge Condition: stable and improved. Discharge home with instructions to follow with PCP in 10 days  Diet recommendation: heart healthy diet, low calorie  Filed Weights   10/08/14 2248 10/10/14 0623  Weight: 128.368 kg (283 lb) 129.6 kg (285 lb 11.5 oz)    History of present illness:  64 y.o. female with Past medical history of COPD, chronic combined systolic and diastolic heart failure, ICD implant, chronic respiratory failure on 4 L of oxygen, hypertension. Who presented with complaints of cough and shortness of breath along with subjective fever and chills ongoing for 2 days PTA. Patient mentioned she initially started with a runny nose and later on had a non-productive cough and intermittent chills. She denies any pain in her chest. She felt nauseous and had one episode of vomiting earlier in the morning of admisison. Denies diarrhea or constipation, melena, hematochezia, dysuria, abdominal pain or any other complaints. One of her granddaughter was also having upper respiratory symptoms.  Hospital Course:  1-HCAP: patient with subjective fever, leukocytosis, left basilar airsapce opacity and symptoms suggested PNA. -WBC's started to trend down, no fever and reports cough/breathing are pretty much resolved -will discharge on augmenting to  complete antibiotic therapy -continue home inhaler treatment and oxygen supplementation  2-chronic resp failure due to COPD: OHS aslo contributing -continue oxygen supplementation -continue home inhaler -no wheezing at discharge  3-combined systolic/diastolic dilated cardiomyopathy: advise to follow low sodium diet -daily weights and continue home medications regimen -ICD in place -last EF 30-35  4-essential HTN: continue home antihypertensive regimen and low sodium diet  5-morbid obesity: Body mass index is 50.63 kg/(m^2). Healthy lifestyle changes and low calorie diet discussed with patient  6-gout: w/o acute flare. Will continue allopurinol  7-HLD: continue statins  Procedures:  See below for x-ray reports   Consultations:  None   Discharge Exam: Filed Vitals:   10/10/14 0324  BP: 118/48  Pulse: 81  Temp: 98.5 F (36.9 C)  Resp: 18    General: AAOX3; denies CP and reports breathing is back to baseline. No fever Cardiovascular: S1 and S2, no rubs or gallops Respiratory: scattered rhonchi, no wheezing or crackles on exam LKG:MWNUU, no guarding; positive BS  Discharge Instructions   Discharge Instructions    Diet - low sodium heart healthy    Complete by:  As directed      Discharge instructions    Complete by:  As directed   Follow low calorie and heart healthy diet Take medications as prescribed Arrange hospital follow up visit with PCP in 10 days Please use flutter valve every 4 hours (10 repetitions each time)          Current Discharge Medication List    START taking these medications   Details  amoxicillin-clavulanate (AUGMENTIN) 875-125 MG per tablet Take 1 tablet by mouth 2 (two) times daily. Qty: 14 tablet, Refills: 0  CONTINUE these medications which have NOT CHANGED   Details  acetaminophen (TYLENOL EX ST ARTHRITIS PAIN) 500 MG tablet Take 1,000-1,500 mg by mouth every 6 (six) hours as needed for mild pain or headache.     albuterol (PROVENTIL HFA;VENTOLIN HFA) 108 (90 BASE) MCG/ACT inhaler Inhale 2 puffs into the lungs every 6 (six) hours as needed for wheezing or shortness of breath.    allopurinol (ZYLOPRIM) 100 MG tablet Take 100 mg by mouth every morning.     aspirin EC 81 MG tablet Take 81 mg by mouth every morning.     fluticasone (FLONASE) 50 MCG/ACT nasal spray Place 1 spray into both nostrils daily.    furosemide (LASIX) 40 MG tablet Take 1 tablet (40 mg total) by mouth daily. Qty: 30 tablet, Refills: 2    metoprolol succinate (TOPROL-XL) 100 MG 24 hr tablet Take 100 mg by mouth daily. Take with or immediately following a meal.    omeprazole (PRILOSEC) 20 MG capsule Take 20 mg by mouth daily.     pravastatin (PRAVACHOL) 20 MG tablet Take 20 mg by mouth daily.    Umeclidinium-Vilanterol (ANORO ELLIPTA) 62.5-25 MCG/INH AEPB Inhale 1 puff into the lungs daily.      STOP taking these medications     levofloxacin (LEVAQUIN) 500 MG tablet      predniSONE (STERAPRED UNI-PAK) 10 MG tablet        No Known Allergies   The results of significant diagnostics from this hospitalization (including imaging, microbiology, ancillary and laboratory) are listed below for reference.    Significant Diagnostic Studies: Dg Chest 2 View  10/09/2014   CLINICAL DATA:  Tiredness and weakness. Dry cough and shortness of breath. Initial encounter.  EXAM: CHEST  2 VIEW  COMPARISON:  Chest radiograph performed 07/09/2014  FINDINGS: The lungs are well-aerated. Mild vascular congestion is noted. Mild left basilar airspace opacity could reflect pneumonia, given the patient's symptoms. No pleural effusion or pneumothorax is seen.  The heart is mildly enlarged. A pacemaker/AICD is noted at the left chest wall, with leads ending at the right atrium, right ventricle and possibly the coronary sinus. No acute osseous abnormalities are seen.  IMPRESSION: Mild vascular congestion and mild cardiomegaly noted. Mild left basilar  airspace opacity could reflect pneumonia, given the patient's symptoms.   Electronically Signed   By: Garald Balding M.D.   On: 10/09/2014 01:15   Labs: Basic Metabolic Panel:  Recent Labs Lab 10/08/14 2354 10/10/14 0610  NA 143 137  K 3.7 3.9  CL 99 100  CO2  --  26  GLUCOSE 142* 149*  BUN 24* 13  CREATININE 1.20* 1.18*  CALCIUM  --  8.5   CBC:  Recent Labs Lab 10/08/14 2344 10/08/14 2354 10/10/14 0610  WBC 22.3*  --  21.2*  NEUTROABS 18.5*  --   --   HGB 13.6 14.6 11.7*  HCT 40.2 43.0 35.2*  MCV 75.8*  --  76.5*  PLT 234  --  231   ProBNP (last 3 results)  Recent Labs  07/09/14 1232  PROBNP 889.0*    Signed:  Barton Dubois  [facility] 10/10/2014, 11:07 AM

## 2014-10-12 LAB — LEGIONELLA ANTIGEN, URINE

## 2014-10-15 LAB — CULTURE, BLOOD (ROUTINE X 2)
Culture: NO GROWTH
Culture: NO GROWTH

## 2015-09-23 ENCOUNTER — Other Ambulatory Visit (HOSPITAL_COMMUNITY): Payer: Self-pay | Admitting: Nurse Practitioner

## 2015-09-23 DIAGNOSIS — M79602 Pain in left arm: Secondary | ICD-10-CM

## 2015-10-04 ENCOUNTER — Ambulatory Visit (HOSPITAL_COMMUNITY): Payer: Medicaid Other

## 2015-10-10 ENCOUNTER — Encounter (HOSPITAL_COMMUNITY): Payer: Self-pay

## 2015-10-10 ENCOUNTER — Emergency Department (HOSPITAL_COMMUNITY): Payer: Medicaid Other

## 2015-10-10 ENCOUNTER — Emergency Department (HOSPITAL_COMMUNITY)
Admission: EM | Admit: 2015-10-10 | Discharge: 2015-10-10 | Disposition: A | Discharge status: Home / Self Care | Payer: Medicaid Other | Attending: Emergency Medicine | Admitting: Emergency Medicine

## 2015-10-10 ENCOUNTER — Emergency Department (HOSPITAL_BASED_OUTPATIENT_CLINIC_OR_DEPARTMENT_OTHER)
Admit: 2015-10-10 | Discharge: 2015-10-10 | Disposition: A | Discharge status: Home / Self Care | Payer: Medicaid Other | Attending: Emergency Medicine | Admitting: Emergency Medicine

## 2015-10-10 DIAGNOSIS — Z87440 Personal history of urinary (tract) infections: Secondary | ICD-10-CM | POA: Diagnosis not present

## 2015-10-10 DIAGNOSIS — M109 Gout, unspecified: Secondary | ICD-10-CM | POA: Diagnosis not present

## 2015-10-10 DIAGNOSIS — I10 Essential (primary) hypertension: Secondary | ICD-10-CM | POA: Insufficient documentation

## 2015-10-10 DIAGNOSIS — I509 Heart failure, unspecified: Secondary | ICD-10-CM | POA: Insufficient documentation

## 2015-10-10 DIAGNOSIS — M7989 Other specified soft tissue disorders: Secondary | ICD-10-CM | POA: Diagnosis not present

## 2015-10-10 DIAGNOSIS — J449 Chronic obstructive pulmonary disease, unspecified: Secondary | ICD-10-CM

## 2015-10-10 DIAGNOSIS — R2232 Localized swelling, mass and lump, left upper limb: Secondary | ICD-10-CM | POA: Insufficient documentation

## 2015-10-10 DIAGNOSIS — Z79899 Other long term (current) drug therapy: Secondary | ICD-10-CM | POA: Diagnosis not present

## 2015-10-10 DIAGNOSIS — G8929 Other chronic pain: Secondary | ICD-10-CM | POA: Diagnosis not present

## 2015-10-10 DIAGNOSIS — Z950 Presence of cardiac pacemaker: Secondary | ICD-10-CM | POA: Insufficient documentation

## 2015-10-10 DIAGNOSIS — I209 Angina pectoris, unspecified: Secondary | ICD-10-CM | POA: Diagnosis not present

## 2015-10-10 DIAGNOSIS — Z7982 Long term (current) use of aspirin: Secondary | ICD-10-CM | POA: Diagnosis not present

## 2015-10-10 DIAGNOSIS — Z87891 Personal history of nicotine dependence: Secondary | ICD-10-CM | POA: Diagnosis not present

## 2015-10-10 DIAGNOSIS — R05 Cough: Secondary | ICD-10-CM | POA: Diagnosis present

## 2015-10-10 DIAGNOSIS — J441 Chronic obstructive pulmonary disease with (acute) exacerbation: Secondary | ICD-10-CM | POA: Insufficient documentation

## 2015-10-10 DIAGNOSIS — R0602 Shortness of breath: Secondary | ICD-10-CM

## 2015-10-10 LAB — CBC WITH DIFFERENTIAL/PLATELET
Basophils Absolute: 0 K/uL (ref 0.0–0.1)
Basophils Relative: 0 %
Eosinophils Absolute: 0.5 K/uL (ref 0.0–0.7)
Eosinophils Relative: 5 %
HCT: 43.6 % (ref 36.0–46.0)
Hemoglobin: 14.3 g/dL (ref 12.0–15.0)
Lymphocytes Relative: 30 %
Lymphs Abs: 3.1 K/uL (ref 0.7–4.0)
MCH: 26.3 pg (ref 26.0–34.0)
MCHC: 32.8 g/dL (ref 30.0–36.0)
MCV: 80.3 fL (ref 78.0–100.0)
Monocytes Absolute: 0.7 K/uL (ref 0.1–1.0)
Monocytes Relative: 7 %
Neutro Abs: 6 K/uL (ref 1.7–7.7)
Neutrophils Relative %: 58 %
Platelets: 259 K/uL (ref 150–400)
RBC: 5.43 MIL/uL — ABNORMAL HIGH (ref 3.87–5.11)
RDW: 15.9 % — ABNORMAL HIGH (ref 11.5–15.5)
WBC: 10.3 K/uL (ref 4.0–10.5)

## 2015-10-10 LAB — BASIC METABOLIC PANEL WITH GFR
Anion gap: 9 (ref 5–15)
BUN: 20 mg/dL (ref 6–20)
CO2: 27 mmol/L (ref 22–32)
Calcium: 9.2 mg/dL (ref 8.9–10.3)
Chloride: 104 mmol/L (ref 101–111)
Creatinine, Ser: 1.18 mg/dL — ABNORMAL HIGH (ref 0.44–1.00)
GFR calc Af Amer: 56 mL/min — ABNORMAL LOW
GFR calc non Af Amer: 48 mL/min — ABNORMAL LOW
Glucose, Bld: 116 mg/dL — ABNORMAL HIGH (ref 65–99)
Potassium: 5.1 mmol/L (ref 3.5–5.1)
Sodium: 140 mmol/L (ref 135–145)

## 2015-10-10 LAB — BRAIN NATRIURETIC PEPTIDE: B Natriuretic Peptide: 46.1 pg/mL (ref 0.0–100.0)

## 2015-10-10 LAB — I-STAT TROPONIN, ED: Troponin i, poc: 0 ng/mL (ref 0.00–0.08)

## 2015-10-10 MED ORDER — ALBUTEROL (5 MG/ML) CONTINUOUS INHALATION SOLN
10.0000 mg/h | INHALATION_SOLUTION | Freq: Once | RESPIRATORY_TRACT | Status: AC
  Administered 2015-10-10: 10 mg/h via RESPIRATORY_TRACT
  Filled 2015-10-10: qty 20

## 2015-10-10 MED ORDER — METHYLPREDNISOLONE SODIUM SUCC 125 MG IJ SOLR
125.0000 mg | Freq: Once | INTRAMUSCULAR | Status: AC
  Administered 2015-10-10: 125 mg via INTRAVENOUS
  Filled 2015-10-10: qty 2

## 2015-10-10 MED ORDER — IPRATROPIUM-ALBUTEROL 0.5-2.5 (3) MG/3ML IN SOLN
3.0000 mL | Freq: Once | RESPIRATORY_TRACT | Status: AC
  Administered 2015-10-10: 3 mL via RESPIRATORY_TRACT
  Filled 2015-10-10: qty 3

## 2015-10-10 MED ORDER — BENZONATATE 100 MG PO CAPS: 100.0000 mg | ORAL_CAPSULE | Freq: Three times a day (TID) | ORAL | Status: DC

## 2015-10-10 MED ORDER — IOHEXOL 300 MG/ML  SOLN: 100.0000 mL | Freq: Once | INTRAMUSCULAR | Status: DC | PRN

## 2015-10-10 MED ORDER — IOHEXOL 350 MG/ML SOLN
100.0000 mL | Freq: Once | INTRAVENOUS | Status: AC | PRN
  Administered 2015-10-10: 100 mL via INTRAVENOUS

## 2015-10-10 MED ORDER — PREDNISONE 10 MG PO TABS: 50.0000 mg | ORAL_TABLET | Freq: Every day | ORAL | Status: DC

## 2015-10-10 NOTE — Progress Notes (Signed)
VASCULAR LAB PRELIMINARY  PRELIMINARY  PRELIMINARY  PRELIMINARY  Left upper extremity venous duplex completed.    Preliminary report:  There is no DVT or SVT noted in the left upper extremity.    Monique Kane, RVT 10/10/2015, 2:35 PM

## 2015-10-10 NOTE — ED Notes (Signed)
Patient transported to X-ray 

## 2015-10-10 NOTE — Discharge Instructions (Signed)
Chronic Obstructive Pulmonary Disease Exacerbation Chronic obstructive pulmonary disease (COPD) is a common lung problem. In COPD, the flow of air from the lungs is limited. COPD exacerbations are times that breathing gets worse and you need extra treatment. Without treatment they can be life threatening. If they happen often, your lungs can become more damaged. If your COPD gets worse, your doctor may treat you with:  Medicines.  Oxygen.  Different ways to clear your airway, such as using a mask. HOME CARE  Do not smoke.  Avoid tobacco smoke and other things that bother your lungs.  If given, take your antibiotic medicine as told. Finish the medicine even if you start to feel better.  Only take medicines as told by your doctor.  Drink enough fluids to keep your pee (urine) clear or pale yellow (unless your doctor has told you not to).  Use a cool mist machine (vaporizer).  If you use oxygen or a machine that turns liquid medicine into a mist (nebulizer), continue to use them as told.  Keep up with shots (vaccinations) as told by your doctor.  Exercise regularly.  Eat healthy foods.  Keep all doctor visits as told. GET HELP RIGHT AWAY IF:  You are very short of breath and it gets worse.  You have trouble talking.  You have bad chest pain.  You have blood in your spit (sputum).  You have a fever.  You keep throwing up (vomiting).  You feel weak, or you pass out (faint).  You feel confused.  You keep getting worse. MAKE SURE YOU:  Understand these instructions.  Will watch your condition.  Will get help right away if you are not doing well or get worse.   This information is not intended to replace advice given to you by your health care provider. Make sure you discuss any questions you have with your health care provider.   Follow up with PCP for re-evaluation. Take steroids as prescribed. Use home albuterol inhaler and oxygen. Return to the ED if you  experience severe worsening of your symptoms, difficulty breathing, chest pain, loss of consciousness, dizziness, fever, chills.

## 2015-10-10 NOTE — ED Notes (Signed)
Pt stayed at or above 93% during stroll

## 2015-10-10 NOTE — ED Provider Notes (Signed)
CSN: [id number]     Arrival date & time 10/10/15  1137 History   First MD Initiated Contact with Patient 10/10/15 1257     Chief Complaint  Patient presents with  . Cough  . Shortness of Breath     (Consider location/radiation/quality/duration/timing/severity/associated sxs/prior Treatment) HPI   Monique Kane is a 65 y.o F with a pmhx of COPD on home O2, CHF, HTN, dysrhythmia with pacemaker implantation who presents to the ED today c/o SOB. Pt states that she has become increasingly short of breath over the last 2 days with associated upper back pain and non-productive cough. Dyspnea is increased with exertion. Pt states that she tried her home inhaler and nebulizers with minimal relief. Pt states that she was seen by her PCP last week for LUE swelling onset 1 year ago that was worsening. She was was scheduled to have an outpatient DVT study performed last week but did not due to lack of insurance coverage. Denies chest pain, LE swelling, fevers, chills.  Past Medical History  Diagnosis Date  . CHF (congestive heart failure) ([place])   . Hypertension   . COPD (chronic obstructive pulmonary disease) ([place])   . Angina   . Dysrhythmia 09/12/11    ventricular paced  . Shortness of breath on exertion   . Chronic lower back pain   . Headache(784.0)   . Dizziness   . Gout   . Hx: UTI (urinary tract infection)   . Cardiac defibrillator in place   . Pacemaker   . Bronchitis, chronic ([place])   . Refusal of blood transfusions as patient is Jehovah's Witness   . Arthritis     hx of gout  . Unspecified constipation/ chronic 06/06/2013   Past Surgical History  Procedure Laterality Date  . 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 Joint replacement    . Tubal ligation    . Insert / replace / remove pacemaker  05/2007    w/defibrillator   No family history on file. Social History  Substance Use Topics  . Smoking status: Former Smoker -- 0.50 packs/day for 15 years    Types: Cigarettes     Quit date: 07/09/2004  . Smokeless tobacco: Never Used     Comment: quit smoking cigarettes ~ 2005  . Alcohol Use: No   OB History    No data available     Review of Systems  All other systems reviewed and are negative.     Allergies  Review of patient's allergies indicates no known allergies.  Home Medications   Prior to Admission medications   Medication Sig Start Date End Date Taking? Authorizing Provider  acetaminophen (TYLENOL EX ST ARTHRITIS PAIN) 500 MG tablet Take 1,000-1,500 mg by mouth every 6 (six) hours as needed for mild pain or headache.   Yes Historical Provider, MD  albuterol (PROVENTIL HFA;VENTOLIN HFA) 108 (90 BASE) MCG/ACT inhaler Inhale 2 puffs into the lungs every 6 (six) hours as needed for wheezing or shortness of breath.   Yes Historical Provider, MD  allopurinol (ZYLOPRIM) 100 MG tablet Take 100 mg by mouth every morning.    Yes Historical Provider, MD  aspirin EC 81 MG tablet Take 81 mg by mouth every morning.    Yes Historical Provider, MD  fluticasone (FLONASE) 50 MCG/ACT nasal spray Place 1 spray into both nostrils daily as needed for rhinitis.    Yes Historical Provider, MD  metoprolol (TOPROL-XL) 200 MG 24 hr tablet Take 200 mg by  mouth daily. 08/27/15  Yes Historical Provider, MD  omeprazole (PRILOSEC) 20 MG capsule Take 20 mg by mouth daily.    Yes Historical Provider, MD  pravastatin (PRAVACHOL) 20 MG tablet Take 20 mg by mouth every morning.    Yes Historical Provider, MD  RESTASIS 0.05 % ophthalmic emulsion place drop in both eyes twice daily 10/01/15  Yes Historical Provider, MD  SYMBICORT 160-4.5 MCG/ACT inhaler INHALE 2 PUFFS TWICE DAILY. RINSE AFTER EACH USE 09/30/15  Yes Historical Provider, MD  torsemide (DEMADEX) 20 MG tablet Take 20 mg by mouth daily. MAY TAKE 1 TABLET IN THE EVENING IF FLUID GAIN OF 2LBS OR MORE 09/21/15  Yes Historical Provider, MD  amoxicillin-clavulanate (AUGMENTIN) 875-125 MG per tablet Take 1 tablet by mouth 2 (two)  times daily. Patient not taking: Reported on 10/10/2015 10/10/14   Barton Dubois, MD  furosemide (LASIX) 40 MG tablet Take 1 tablet (40 mg total) by mouth daily. Patient not taking: Reported on 10/10/2015 07/12/14   Erline Hau, MD   BP 109/99 mmHg  Pulse 103  Temp(Src) 98.7 F (37.1 C) (Oral)  Resp 19  SpO2 96% Physical Exam  Constitutional: She is oriented to person, place, and time. She appears well-developed and well-nourished. No distress.  HENT:  Head: Normocephalic and atraumatic.  Mouth/Throat: No oropharyngeal exudate.  Eyes: Conjunctivae and EOM are normal. Pupils are equal, round, and reactive to light. Right eye exhibits no discharge. Left eye exhibits no discharge. No scleral icterus.  Cardiovascular: Normal rate, regular rhythm, normal heart sounds and intact distal pulses.  Exam reveals no gallop and no friction rub.   No murmur heard. Pulmonary/Chest: Effort normal. No respiratory distress. She has decreased breath sounds in the right upper field, the right lower field, the left upper field and the left lower field. She has no wheezes. She has no rhonchi. She has no rales. She exhibits no tenderness.  Abdominal: Soft. She exhibits no distension. There is no tenderness. There is no guarding.  Musculoskeletal: Normal range of motion. She exhibits no edema.  No appreciable LUE swelling  Neurological: She is alert and oriented to person, place, and time.  Skin: Skin is warm and dry. No rash noted. She is not diaphoretic. No erythema. No pallor.  Psychiatric: She has a normal mood and affect. Her behavior is normal.  Nursing note and vitals reviewed.   ED Course  Procedures (including critical care time) Labs Review Labs Reviewed  BASIC METABOLIC PANEL - Abnormal; Notable for the following:    Glucose, Bld 116 (*)    Creatinine, Ser 1.18 (*)    GFR calc non Af Amer 48 (*)    GFR calc Af Amer 56 (*)    All other components within normal limits  CBC WITH  DIFFERENTIAL/PLATELET - Abnormal; Notable for the following:    RBC 5.43 (*)    RDW 15.9 (*)    All other components within normal limits    Imaging Review Dg Chest 2 View  10/10/2015  CLINICAL DATA:  Acute shortness of breath today. EXAM: CHEST  2 VIEW COMPARISON:  10/09/2014 and prior chest radiographs FINDINGS: Cardiomegaly and mild vascular congestion again noted. A left ICD/pacemaker again identified. There is no evidence of focal airspace disease, pulmonary edema, suspicious pulmonary nodule/mass, pleural effusion, or pneumothorax. No acute bony abnormalities are identified. IMPRESSION: Cardiomegaly with mild pulmonary vascular congestion. Electronically Signed   By: Margarette Canada M.D.   On: 10/10/2015 12:51   I have personally reviewed  and evaluated these images and lab results as part of my medical decision-making.   EKG Interpretation   Date/Time:  Sunday October 10 2015 12:01:10 EST Ventricular Rate:  100 PR Interval:  48 QRS Duration: 152 QT Interval:  404 QTC Calculation: 521 R Axis:   -94 Text Interpretation:  Atrial-sensed ventricular-paced rhythm No further  analysis attempted due to paced rhythm No significant change since last  tracing Confirmed by YAO  MD, DAVID ([id number]) on 10/10/2015 1:29:51 PM      MDM   Final diagnoses:  Left upper extremity swelling  Shortness of breath  Chronic obstructive pulmonary disease, unspecified COPD type ([place])   65 year old female with history of COPD on home O2, CHF presents the emergency department with increase in shortness of breath over the last 2 days. Dyspnea is greater with exertion. Patient is tachycardic on presentation to ED. No respiratory distress noted. Patient with diminished breath sounds in all lung fields. No chest pain. Patient was scheduled to have outpatient left upper extremity DVT study last week but has yet to have this performed. Concern for PE. We'll obtain CT PE study as well as DVT ultrasound study of left  upper extremity. Patient is not hypoxic on 2 L of O2 which is her baseline, O2 saturations are 97%. Patient given Solu-Medrol and DuoNeb.  DVT ultrasound negative. CT PE skin negative for PE or significant heart failure. EKG unchanged from previous.   Upon repeat examination. Patient saw reports shortness of breath. Breath stones are still diminished. We will administer continuous nebulizer. Suspect symptoms are due to COPD exacerbation. Patient will be signed out to [facility] at shift change. Troponin and BNP pending. Plan to ambulate patient at end of nebulizer treatment. If no hypoxia noted will DC home with steroids. Patient has home albuterol inhalers. If patient unable to maintain oxygen saturation will admit to medicine for COPD exacerbation.    Dondra Spry [place], PA-C 10/10/15 Weott Yao, MD 10/11/15 [phone]

## 2015-10-10 NOTE — ED Notes (Addendum)
Pt presents with c/o cough, shortness of breath, and some chest pressure from the coughing for approx 2 days. Pt reports she wears 2L of O2 at home, hx of COPD. Pt was supposed to have an ultrasound done on her arm last week to rule out DVT but could not get the expenses covered by Medicaid and so the procedure was not done.

## 2015-10-10 NOTE — ED Provider Notes (Signed)
Patient signed out to me at shift change by Donnald Garre, PA-C, who discussed the patient's care with Dr. Darl Householder.  65 year old female presents with cough and shortness of breath x 2 days.  Patient is afebrile. Tachycardic to 105. On 2 L O2 (baseline) with stable O2 sat.  EKG AV paced rhythm, heart rate 100. Troponin negative. BNP within normal limits. CBC negative for leukocytosis or anemia. BMP remarkable for creatinine 1.18. Chest x-ray remarkable for cardiomegaly with mild pulmonary vascular congestion. CT PE negative for PE, no evidence of acute pulmonary disease.  Plan to administer continuous albuterol nebulizer then ambulate. If maintains O2 sat will discharge with short steroid course and close PCP follow-up. Patient maintained sat >93% while ambulating. Patient is non-toxic and well-appearing, feel she is stable for discharge at this time. Symptoms likely due to COPD exacerbation. Patient to follow-up with PCP. Return precautions discussed. Patient verbalizes her understanding and is in agreement with plan.  BP 110/64 mmHg  Pulse 95  Temp(Src) 98 F (36.7 C) (Oral)  Resp 20  SpO2 94%   Marella Chimes, PA-C 10/10/15 [place] Yao, MD 10/11/15 [phone]

## 2016-02-01 ENCOUNTER — Other Ambulatory Visit: Payer: Self-pay | Admitting: Orthopedic Surgery

## 2016-02-01 DIAGNOSIS — R2232 Localized swelling, mass and lump, left upper limb: Secondary | ICD-10-CM

## 2016-02-07 ENCOUNTER — Ambulatory Visit
Admission: RE | Admit: 2016-02-07 | Discharge: 2016-02-07 | Disposition: A | Discharge status: Home / Self Care | Payer: Medicaid Other | Source: Ambulatory Visit | Attending: Orthopedic Surgery | Admitting: Orthopedic Surgery

## 2016-02-07 DIAGNOSIS — R2232 Localized swelling, mass and lump, left upper limb: Secondary | ICD-10-CM

## 2016-02-07 MED ORDER — IOPAMIDOL (ISOVUE-300) INJECTION 61%
100.0000 mL | Freq: Once | INTRAVENOUS | Status: AC | PRN
  Administered 2016-02-07: 100 mL via INTRAVENOUS

## 2016-02-09 ENCOUNTER — Telehealth: Payer: Self-pay | Admitting: Hematology and Oncology

## 2016-02-09 NOTE — Telephone Encounter (Signed)
Appointment scheduled 6/21 at 10:45. Patient agreed and was told to arrive 30 minutes early. Demographics and insurance verified. Letter to referring.

## 2016-02-10 ENCOUNTER — Encounter: Payer: Self-pay | Admitting: Hematology and Oncology

## 2016-02-16 ENCOUNTER — Other Ambulatory Visit: Payer: Self-pay | Admitting: Hematology and Oncology

## 2016-02-16 ENCOUNTER — Encounter: Payer: Self-pay | Admitting: Hematology and Oncology

## 2016-02-16 ENCOUNTER — Other Ambulatory Visit (HOSPITAL_BASED_OUTPATIENT_CLINIC_OR_DEPARTMENT_OTHER): Payer: Medicaid Other

## 2016-02-16 ENCOUNTER — Telehealth: Payer: Self-pay | Admitting: Hematology and Oncology

## 2016-02-16 ENCOUNTER — Ambulatory Visit (HOSPITAL_BASED_OUTPATIENT_CLINIC_OR_DEPARTMENT_OTHER): Payer: Medicaid Other | Admitting: Hematology and Oncology

## 2016-02-16 VITALS — BP 140/91 | HR 82 | Temp 97.7°F | Resp 18 | Ht 63.0 in | Wt 269.4 lb

## 2016-02-16 DIAGNOSIS — J449 Chronic obstructive pulmonary disease, unspecified: Secondary | ICD-10-CM

## 2016-02-16 DIAGNOSIS — R918 Other nonspecific abnormal finding of lung field: Secondary | ICD-10-CM | POA: Diagnosis present

## 2016-02-16 DIAGNOSIS — N183 Chronic kidney disease, stage 3 unspecified: Secondary | ICD-10-CM | POA: Insufficient documentation

## 2016-02-16 DIAGNOSIS — K769 Liver disease, unspecified: Secondary | ICD-10-CM | POA: Diagnosis not present

## 2016-02-16 DIAGNOSIS — M79602 Pain in left arm: Secondary | ICD-10-CM | POA: Diagnosis not present

## 2016-02-16 DIAGNOSIS — Z950 Presence of cardiac pacemaker: Secondary | ICD-10-CM | POA: Insufficient documentation

## 2016-02-16 DIAGNOSIS — I420 Dilated cardiomyopathy: Secondary | ICD-10-CM | POA: Diagnosis not present

## 2016-02-16 DIAGNOSIS — C3492 Malignant neoplasm of unspecified part of left bronchus or lung: Secondary | ICD-10-CM

## 2016-02-16 DIAGNOSIS — J438 Other emphysema: Secondary | ICD-10-CM

## 2016-02-16 DIAGNOSIS — G893 Neoplasm related pain (acute) (chronic): Secondary | ICD-10-CM | POA: Insufficient documentation

## 2016-02-16 LAB — CBC WITH DIFFERENTIAL/PLATELET
BASO%: 0.8 % (ref 0.0–2.0)
Basophils Absolute: 0.1 10*3/uL (ref 0.0–0.1)
EOS%: 2.6 % (ref 0.0–7.0)
Eosinophils Absolute: 0.3 10*3/uL (ref 0.0–0.5)
HCT: 45.3 % (ref 34.8–46.6)
HGB: 14.9 g/dL (ref 11.6–15.9)
LYMPH%: 22 % (ref 14.0–49.7)
MCH: 26 pg (ref 25.1–34.0)
MCHC: 32.8 g/dL (ref 31.5–36.0)
MCV: 79.4 fL — ABNORMAL LOW (ref 79.5–101.0)
MONO#: 1.2 10*3/uL — AB (ref 0.1–0.9)
MONO%: 11.2 % (ref 0.0–14.0)
NEUT%: 63.4 % (ref 38.4–76.8)
NEUTROS ABS: 6.6 10*3/uL — AB (ref 1.5–6.5)
Platelets: 272 10*3/uL (ref 145–400)
RBC: 5.71 10*6/uL — AB (ref 3.70–5.45)
RDW: 16.6 % — ABNORMAL HIGH (ref 11.2–14.5)
WBC: 10.5 10*3/uL — AB (ref 3.9–10.3)
lymph#: 2.3 10*3/uL (ref 0.9–3.3)

## 2016-02-16 LAB — COMPREHENSIVE METABOLIC PANEL
ALT: 32 U/L (ref 0–55)
AST: 73 U/L — ABNORMAL HIGH (ref 5–34)
Albumin: 3.2 g/dL — ABNORMAL LOW (ref 3.5–5.0)
Alkaline Phosphatase: 140 U/L (ref 40–150)
Anion Gap: 15 mEq/L — ABNORMAL HIGH (ref 3–11)
BUN: 19.7 mg/dL (ref 7.0–26.0)
CO2: 26 mEq/L (ref 22–29)
Calcium: 9.7 mg/dL (ref 8.4–10.4)
Chloride: 101 mEq/L (ref 98–109)
Creatinine: 1.4 mg/dL — ABNORMAL HIGH (ref 0.6–1.1)
EGFR: 47 mL/min/{1.73_m2} — ABNORMAL LOW (ref >90)
Glucose: 119 mg/dl (ref 70–140)
Potassium: 3.9 mEq/L (ref 3.5–5.1)
Sodium: 141 mEq/L (ref 136–145)
Total Bilirubin: 0.62 mg/dL (ref 0.20–1.20)
Total Protein: 8.4 g/dL — ABNORMAL HIGH (ref 6.4–8.3)

## 2016-02-16 LAB — PROTIME-INR
INR: 1.1 — ABNORMAL LOW (ref 2.00–3.50)
Protime: 13.2 Seconds (ref 10.6–13.4)

## 2016-02-16 MED ORDER — HYDROMORPHONE HCL 2 MG PO TABS: 2.0000 mg | ORAL_TABLET | ORAL | Status: DC | PRN

## 2016-02-16 NOTE — Assessment & Plan Note (Signed)
She has dilated cardiomyopathy on medical management and long-term oxygen therapy. Her last echocardiogram show significant depressed ejection fraction. She will continue medical management for now

## 2016-02-16 NOTE — Progress Notes (Signed)
[facility] CONSULT NOTE  Patient Care Team: Pcp Not In System as PCP - General Monique Chimera, PA-C as Referring Physician (Pulmonary Disease)  CHIEF COMPLAINTS/PURPOSE OF CONSULTATION:  Lung mass, widespread liver lesions, suspicious for metastatic lung cancer  HISTORY OF PRESENTING ILLNESS:  Monique Kane 64 y.o. female is here because of recent diagnosis of cancer. This patient has significant comorbidities including cardiomyopathy with pacemaker implantation, chronic COPD dependent on oxygen for the past 4-5 years, morbid obesity and chronic immobility. She is present today with her caregiver. The patient has recurrent presentation to the emergency room for other complaints. Most recently, in February 2017, she had CT angiogram which did not detect any significant anomalies or pulmonary emboli. She started to have left arm pain recently and was referred to orthopedic service for further evaluation. Subsequent recent CT scan of the left humerus was unremarkable but it revealed new lung mass, hilar lymphadenopathy and widespread metastatic cancer in the liver. The patient has history of significant current smoking but quit many years ago. I review her records and summarize her oncologic history as follows:   Carcinoma of left lung ([place])   10/10/2015 Imaging CT angiogram showed no evidence of PE or lung mass   02/07/2016 Imaging Mediastinal and left hilar adenopathy with a small left upper lobe lung mass suggesting metastatic carcinoma of the lung. Multiple metastatic lesions in the liver. CT scan of the left humerus: Normal left humerus    Currently, she has chronic nonproductive cough with occasional slight mucus production. She denies hemoptysis. She had 40 pound weight loss due to anorexia. She has persistent arm pain. She rated her pain 8 out of 10. Tramadol did not help with her pain. At baseline, she has 4-5 pillow orthopnea. She is dependent on high flow oxygen  therapy at baseline on a continuous basis for the past 5 years. She had history of recurrent pneumonia in the past  MEDICAL HISTORY:  Past Medical History  Diagnosis Date  . CHF (congestive heart failure) ([place])   . Hypertension   . COPD (chronic obstructive pulmonary disease) ([place])   . Angina   . Dysrhythmia 09/12/11    ventricular paced  . Shortness of breath on exertion   . Chronic lower back pain   . Headache(784.0)   . Dizziness   . Gout   . Hx: UTI (urinary tract infection)   . Cardiac defibrillator in place   . Pacemaker   . Bronchitis, chronic ([place])   . Refusal of blood transfusions as patient is Jehovah's Witness   . Arthritis     hx of gout  . Unspecified constipation/ chronic 06/06/2013    SURGICAL HISTORY: Past Surgical History  Procedure Laterality Date  . 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 Joint replacement    . Tubal ligation    . Insert / replace / remove pacemaker  05/2007    w/defibrillator    SOCIAL HISTORY: Social History   Social History  . Marital Status: Single    Spouse Name: N/A  . Number of Children: N/A  . Years of Education: N/A   Occupational History  . Not on file.   Social History Main Topics  . Smoking status: Former Smoker -- 0.50 packs/day for 40 years    Types: Cigarettes    Quit date: 07/09/2009  . Smokeless tobacco: Never Used     Comment: quit smoking cigarettes ~ 2005  . Alcohol Use: No  . Drug  Use: No  . 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 Daughter Monique Kane daugher   Other Topics Concern  . Not on file   Social History Narrative    FAMILY HISTORY: Family History  Problem Relation Age of Onset  . COPD Mother   . COPD Father     ALLERGIES:  has No Known Allergies.  MEDICATIONS:  Current Outpatient Prescriptions  Medication Sig Dispense Refill  . acetaminophen (TYLENOL EX ST ARTHRITIS PAIN) 500 MG tablet Take 1,000-1,500 mg by mouth every 6 (six) hours as  needed for mild pain or headache.    . albuterol (PROVENTIL HFA;VENTOLIN HFA) 108 (90 BASE) MCG/ACT inhaler Inhale 2 puffs into the lungs every 6 (six) hours as needed for wheezing or shortness of breath.    Marland Kitchen albuterol (PROVENTIL) (2.5 MG/3ML) 0.083% nebulizer solution Take 2.5 mg by nebulization every 6 (six) hours as needed for wheezing or shortness of breath.    . allopurinol (ZYLOPRIM) 100 MG tablet Take 100 mg by mouth every morning.     Marland Kitchen aspirin EC 81 MG tablet Take 81 mg by mouth every morning.     . fluticasone (FLONASE) 50 MCG/ACT nasal spray Place 1 spray into both nostrils daily as needed for rhinitis.     . metoprolol (TOPROL-XL) 200 MG 24 hr tablet Take 200 mg by mouth daily.  6  . omeprazole (PRILOSEC) 20 MG capsule Take 20 mg by mouth daily.     . pravastatin (PRAVACHOL) 20 MG tablet Take 20 mg by mouth every morning.     . RESTASIS 0.05 % ophthalmic emulsion place drop in both eyes twice daily  1  . SYMBICORT 160-4.5 MCG/ACT inhaler INHALE 2 PUFFS TWICE DAILY. RINSE AFTER EACH USE  5  . torsemide (DEMADEX) 20 MG tablet Take 20 mg by mouth daily. MAY TAKE 1 TABLET IN THE EVENING IF FLUID GAIN OF 2LBS OR MORE    . umeclidinium bromide (INCRUSE ELLIPTA) 62.5 MCG/INH AEPB Inhale 1 puff into the lungs daily.    Marland Kitchen HYDROmorphone (DILAUDID) 2 MG tablet Take 1 tablet (2 mg total) by mouth every 4 (four) hours as needed for severe pain. 30 tablet 0   No current facility-administered medications for this visit.    REVIEW OF SYSTEMS:   Constitutional: Denies fevers, chills or abnormal night sweats Eyes: Denies blurriness of vision, double vision or watery eyes Ears, nose, mouth, throat, and face: Denies mucositis or sore throat Cardiovascular: Denies palpitation, chest discomfort or lower extremity swelling Gastrointestinal:  Denies nausea, heartburn or change in bowel habits Skin: Denies abnormal skin rashes Lymphatics: Denies new lymphadenopathy or easy bruising Neurological:Denies  numbness, tingling or new weaknesses Behavioral/Psych: Mood is stable, no new changes  All other systems were reviewed with the patient and are negative.  PHYSICAL EXAMINATION: ECOG PERFORMANCE STATUS: 2 - Symptomatic, <50% confined to bed  Filed Vitals:   02/16/16 1106  BP: 140/91  Pulse: 82  Temp: 97.7 F (36.5 C)  Resp: 18   Filed Weights   02/16/16 1106  Weight: 269 lb 6.4 oz (122.199 kg)    GENERAL:alert, no distress and comfortable. She is morbidly obese, with high flow oxygen through nasal cannula and she sits on the wheelchair. I examined her while she is sitting on her wheelchair SKIN: skin color, texture, turgor are normal, no rashes or significant lesions EYES: normal, conjunctiva are pink and non-injected, sclera clear OROPHARYNX:no exudate, no erythema and lips, buccal mucosa, and  tongue normal  NECK: supple, thyroid normal size, non-tender, without nodularity LYMPH:  no palpable lymphadenopathy in the cervical, axillary or inguinal LUNGS: clear to auscultation and percussion with normal breathing effort HEART: regular rate & rhythm and no murmurs and no lower extremity edema ABDOMEN:abdomen soft, non-tender and normal bowel sounds Musculoskeletal:no cyanosis of digits and no clubbing  PSYCH: alert & oriented x 3 with fluent speech NEURO: no focal motor/sensory deficits  LABORATORY DATA:  I have reviewed the data as listed Lab Results  Component Value Date   WBC 10.5* 02/16/2016   HGB 14.9 02/16/2016   HCT 45.3 02/16/2016   MCV 79.4* 02/16/2016   PLT 272 02/16/2016    Recent Labs  10/10/15 1413 02/16/16 1202  NA 140 141  K 5.1 3.9  CL 104  --   CO2 27 26  GLUCOSE 116* 119  BUN 20 19.7  CREATININE 1.18* 1.4*  CALCIUM 9.2 9.7  GFRNONAA 48*  --   GFRAA 56*  --   PROT  --  8.4*  ALBUMIN  --  3.2*  AST  --  73*  ALT  --  32  ALKPHOS  --  140  BILITOT  --  0.62    RADIOGRAPHIC STUDIES:I reviewed his CT imaging with the patient I have  personally reviewed the radiological images as listed and agreed with the findings in the report. Ct Humerus Left W Contrast  02/07/2016  CLINICAL DATA:  Left arm mass. Soft tissue swelling from the proximal shoulder to the lower humerus. Progressive pain for 6 months. EXAM: CT OF THE LEFT HUMERUS WITH CONTRAST; CT OF THE LEFT SHOULDER WITH CONTRAST TECHNIQUE: Multidetector CT imaging was performed following the standard protocol during bolus administration of intravenous contrast. CONTRAST:  140m ISOVUE-300 IOPAMIDOL (ISOVUE-300) INJECTION 61% COMPARISON:  Radiographs dated 01/20/2016 FINDINGS: CT scan of the left shoulder: The bones and soft tissues of the left shoulder appear normal. Pacemaker just above the left breast. The visualized bones of the thorax appear normal except for minimal degenerative changes in the thoracic spine. However, the patient has mediastinal and left hilar adenopathy with a small irregular mass in the superior segment of left lower lobe measuring 14 x 9 mm. Left hilar nodal mass measures 28 mm on image 114 of series 7. Subcarinal lymph node mass as a minimal dimension of 2.1 cm. Scans through the upper abdomen demonstrate multiple metastatic lesions in the right and left lobes of the liver. The liver is incompletely visualized. Heart size is normal. Coronary artery calcification. Pacemaker in place. CT scan of the left humerus: Left humerus appears normal. No soft tissue mass. The patient is morbidly obese and has prominent subcutaneous fat around the left shoulder and in the left upper arm but there is no worrisome soft tissue mass in the left arm. IMPRESSION: CT scan of the shoulder: Negative appearance of the left shoulder. Mediastinal and left hilar adenopathy with a small left upper lobe lung mass suggesting metastatic carcinoma of the lung. Multiple metastatic lesions in the liver. CT scan of the left humerus: Normal left humerus and soft tissues of the left upper arm. Prominent  subcutaneous fat without a discrete mass in the arm. Electronically Signed   By: JLorriane ShireM.D.   On: 02/07/2016 16:06   Ct Shoulder Left W Contrast  02/07/2016  CLINICAL DATA:  Left arm mass. Soft tissue swelling from the proximal shoulder to the lower humerus. Progressive pain for 6 months. EXAM: CT OF THE LEFT HUMERUS  WITH CONTRAST; CT OF THE LEFT SHOULDER WITH CONTRAST TECHNIQUE: Multidetector CT imaging was performed following the standard protocol during bolus administration of intravenous contrast. CONTRAST:  111m ISOVUE-300 IOPAMIDOL (ISOVUE-300) INJECTION 61% COMPARISON:  Radiographs dated 01/20/2016 FINDINGS: CT scan of the left shoulder: The bones and soft tissues of the left shoulder appear normal. Pacemaker just above the left breast. The visualized bones of the thorax appear normal except for minimal degenerative changes in the thoracic spine. However, the patient has mediastinal and left hilar adenopathy with a small irregular mass in the superior segment of left lower lobe measuring 14 x 9 mm. Left hilar nodal mass measures 28 mm on image 114 of series 7. Subcarinal lymph node mass as a minimal dimension of 2.1 cm. Scans through the upper abdomen demonstrate multiple metastatic lesions in the right and left lobes of the liver. The liver is incompletely visualized. Heart size is normal. Coronary artery calcification. Pacemaker in place. CT scan of the left humerus: Left humerus appears normal. No soft tissue mass. The patient is morbidly obese and has prominent subcutaneous fat around the left shoulder and in the left upper arm but there is no worrisome soft tissue mass in the left arm. IMPRESSION: CT scan of the shoulder: Negative appearance of the left shoulder. Mediastinal and left hilar adenopathy with a small left upper lobe lung mass suggesting metastatic carcinoma of the lung. Multiple metastatic lesions in the liver. CT scan of the left humerus: Normal left humerus and soft tissues of  the left upper arm. Prominent subcutaneous fat without a discrete mass in the arm. Electronically Signed   By: JLorriane ShireM.D.   On: 02/07/2016 16:06    ASSESSMENT & PLAN:  [place] Given her risk factor of former smoking history and rapid progression of disease (she had negative CT scan in February 2017), I suspect she may have small cell lung cancer. Given her significant cardiovascular and pulmonary problems, she is not a candidate for bronchoscopy or lung biopsy. I recommend CT-guided biopsy of the liver masses. I recommend CT scan of the abdomen and pelvis with contrast to complete staging. Even though I have no proof, I suspect she may have widespread lung cancer. With her significant other comorbidities, she may not be a candidate for aggressive chemotherapy. The patient would still like to pursue further testing. With presence of pacemaker, I will order a CT scan of the head with contrast rather than MRI brain. I will see her back in 2 weeks to review test results  COPD (chronic obstructive pulmonary disease) ([place] The patient has chronic respiratory failure and is oxygen dependent. She has 4-5 pillow orthopnea. She is not a surgical candidate and I do not think she can lay still for radiation treatment. Given widespread systemic disease, I would think she may benefit the most with systemic chemotherapy. I will bring her back to review test results once we have results from surgical biopsy.  Dilated cardiomyopathy ([place] She has dilated cardiomyopathy on medical management and long-term oxygen therapy. Her last echocardiogram show significant depressed ejection fraction. She will continue medical management for now  Cancer associated pain Interestingly, she has referred pain over the left arm without local pathology to explaine her left arm pain from CT. I recommend low-dose hydromorphone to take as needed for pain. I warned her about risk of sedation and  constipation with pain medicine   Orders Placed This Encounter  Procedures  . CT ABDOMEN PELVIS W CONTRAST  Standing Status: Future     Number of Occurrences:      Standing Expiration Date: 05/18/2017    Order Specific Question:  Reason for Exam (SYMPTOM  OR DIAGNOSIS REQUIRED)    Answer:  lung cancer staging    Order Specific Question:  Preferred imaging location?    Answer:  [facility]  . CT BIOPSY    Standing Status: Future     Number of Occurrences:      Standing Expiration Date: 03/22/2017    Order Specific Question:  Reason for Exam (SYMPTOM  OR DIAGNOSIS REQUIRED)    Answer:  ct biopsy liver mass    Order Specific Question:  Preferred imaging location?    Answer:  [facility]  . CT Head W Wo Contrast    Standing Status: Future     Number of Occurrences:      Standing Expiration Date: 02/15/2017    Order Specific Question:  If indicated for the ordered procedure, I authorize the administration of contrast media per Radiology protocol    Answer:  Yes    Order Specific Question:  Reason for Exam (SYMPTOM  OR DIAGNOSIS REQUIRED)    Answer:  staging lung ca, exclude brain mets    Order Specific Question:  Preferred imaging location?    Answer:  [facility]  . CBC with Differential/Platelet    Standing Status: Future     Number of Occurrences: 1     Standing Expiration Date: 03/22/2017  . Comprehensive metabolic panel    Standing Status: Future     Number of Occurrences: 1     Standing Expiration Date: 03/22/2017  . Protime-INR    Standing Status: Future     Number of Occurrences: 1     Standing Expiration Date: 03/22/2017     All questions were answered. The patient knows to call the clinic with any problems, questions or concerns. I spent 55 minutes counseling the patient face to face. The total time spent in the appointment was 60 minutes and more than 50% was on counseling.     [facility] [place], [place], MD 02/16/2016 2:14 PM

## 2016-02-16 NOTE — Assessment & Plan Note (Signed)
Given her risk factor of former smoking history and rapid progression of disease (she had negative CT scan in February 2017), I suspect she may have small cell lung cancer. Given her significant cardiovascular and pulmonary problems, she is not a candidate for bronchoscopy or lung biopsy. I recommend CT-guided biopsy of the liver masses. I recommend CT scan of the abdomen and pelvis with contrast to complete staging. Even though I have no proof, I suspect she may have widespread lung cancer. With her significant other comorbidities, she may not be a candidate for aggressive chemotherapy. The patient would still like to pursue further testing. With presence of pacemaker, I will order a CT scan of the head with contrast rather than MRI brain. I will see her back in 2 weeks to review test results

## 2016-02-16 NOTE — Telephone Encounter (Signed)
Gave and printed appt sched and avs for pt for July  °

## 2016-02-16 NOTE — Assessment & Plan Note (Signed)
The patient has chronic respiratory failure and is oxygen dependent. She has 4-5 pillow orthopnea. She is not a surgical candidate and I do not think she can lay still for radiation treatment. Given widespread systemic disease, I would think she may benefit the most with systemic chemotherapy. I will bring her back to review test results once we have results from surgical biopsy.

## 2016-02-16 NOTE — Assessment & Plan Note (Signed)
Interestingly, she has referred pain over the left arm without local pathology to explaine her left arm pain from CT. I recommend low-dose hydromorphone to take as needed for pain. I warned her about risk of sedation and constipation with pain medicine

## 2016-02-21 ENCOUNTER — Telehealth: Payer: Self-pay | Admitting: *Deleted

## 2016-02-21 NOTE — Telephone Encounter (Signed)
"  I was treated last week by Dr. Alvy Bimler.  I am running out of pain medicine.  Call [phone]."

## 2016-02-22 ENCOUNTER — Other Ambulatory Visit: Payer: Self-pay | Admitting: Hematology and Oncology

## 2016-02-22 ENCOUNTER — Telehealth: Payer: Self-pay | Admitting: *Deleted

## 2016-02-22 DIAGNOSIS — G893 Neoplasm related pain (acute) (chronic): Secondary | ICD-10-CM

## 2016-02-22 MED ORDER — HYDROMORPHONE HCL 2 MG PO TABS: 2.0000 mg | ORAL_TABLET | ORAL | Status: DC | PRN

## 2016-02-22 NOTE — Telephone Encounter (Signed)
Informed pt Rx ready to pick up at our office.  Also informed pt of CT Abd/Pelvis scheduled for tomorrow at 2 pm at [facility] Radiology Dept..  NPO after 10 am.  Drink one bottle of contrast at 12 noon and one at 1 pm.   Written instructions left w/  Rx for pt to pick up today.  Pt already has contrast bottles at home.  Informed pt CT Head not approved by insurance at this time.  We will proceed with CT Abd/Pel w/o the head CT so we can get her Biopsy done.  She verbalized understanding.

## 2016-02-23 ENCOUNTER — Ambulatory Visit (HOSPITAL_COMMUNITY)
Admission: RE | Admit: 2016-02-23 | Discharge: 2016-02-23 | Disposition: A | Discharge status: Home / Self Care | Payer: Medicaid Other | Source: Ambulatory Visit | Attending: Hematology and Oncology | Admitting: Hematology and Oncology

## 2016-02-23 ENCOUNTER — Encounter (HOSPITAL_COMMUNITY): Payer: Self-pay

## 2016-02-23 DIAGNOSIS — C787 Secondary malignant neoplasm of liver and intrahepatic bile duct: Secondary | ICD-10-CM | POA: Diagnosis not present

## 2016-02-23 DIAGNOSIS — C3492 Malignant neoplasm of unspecified part of left bronchus or lung: Secondary | ICD-10-CM | POA: Diagnosis present

## 2016-02-23 MED ORDER — IOPAMIDOL (ISOVUE-300) INJECTION 61%
100.0000 mL | Freq: Once | INTRAVENOUS | Status: AC | PRN
  Administered 2016-02-23: 100 mL via INTRAVENOUS

## 2016-02-28 ENCOUNTER — Other Ambulatory Visit: Payer: Self-pay | Admitting: Radiology

## 2016-03-01 ENCOUNTER — Ambulatory Visit (HOSPITAL_COMMUNITY)
Admission: RE | Admit: 2016-03-01 | Discharge: 2016-03-01 | Disposition: A | Discharge status: Home / Self Care | Payer: Medicaid Other | Source: Ambulatory Visit | Attending: Hematology and Oncology | Admitting: Hematology and Oncology

## 2016-03-01 ENCOUNTER — Encounter (HOSPITAL_COMMUNITY): Payer: Self-pay

## 2016-03-01 DIAGNOSIS — I110 Hypertensive heart disease with heart failure: Secondary | ICD-10-CM | POA: Diagnosis not present

## 2016-03-01 DIAGNOSIS — Z7982 Long term (current) use of aspirin: Secondary | ICD-10-CM | POA: Insufficient documentation

## 2016-03-01 DIAGNOSIS — J449 Chronic obstructive pulmonary disease, unspecified: Secondary | ICD-10-CM | POA: Insufficient documentation

## 2016-03-01 DIAGNOSIS — C3492 Malignant neoplasm of unspecified part of left bronchus or lung: Secondary | ICD-10-CM

## 2016-03-01 DIAGNOSIS — R918 Other nonspecific abnormal finding of lung field: Secondary | ICD-10-CM | POA: Diagnosis not present

## 2016-03-01 DIAGNOSIS — C7A8 Other malignant neuroendocrine tumors: Secondary | ICD-10-CM | POA: Diagnosis not present

## 2016-03-01 DIAGNOSIS — K769 Liver disease, unspecified: Secondary | ICD-10-CM | POA: Diagnosis present

## 2016-03-01 DIAGNOSIS — C7B8 Other secondary neuroendocrine tumors: Secondary | ICD-10-CM | POA: Diagnosis not present

## 2016-03-01 DIAGNOSIS — I509 Heart failure, unspecified: Secondary | ICD-10-CM | POA: Diagnosis not present

## 2016-03-01 DIAGNOSIS — Z79899 Other long term (current) drug therapy: Secondary | ICD-10-CM | POA: Insufficient documentation

## 2016-03-01 DIAGNOSIS — Z87891 Personal history of nicotine dependence: Secondary | ICD-10-CM | POA: Diagnosis not present

## 2016-03-01 DIAGNOSIS — Z825 Family history of asthma and other chronic lower respiratory diseases: Secondary | ICD-10-CM | POA: Diagnosis not present

## 2016-03-01 DIAGNOSIS — Z7951 Long term (current) use of inhaled steroids: Secondary | ICD-10-CM | POA: Diagnosis not present

## 2016-03-01 DIAGNOSIS — Z96652 Presence of left artificial knee joint: Secondary | ICD-10-CM | POA: Insufficient documentation

## 2016-03-01 DIAGNOSIS — Z950 Presence of cardiac pacemaker: Secondary | ICD-10-CM | POA: Insufficient documentation

## 2016-03-01 LAB — CBC WITH DIFFERENTIAL/PLATELET
Basophils Absolute: 0 10*3/uL (ref 0.0–0.1)
Basophils Relative: 0 %
Eosinophils Absolute: 0.2 10*3/uL (ref 0.0–0.7)
Eosinophils Relative: 3 %
HCT: 42.7 % (ref 36.0–46.0)
Hemoglobin: 14.2 g/dL (ref 12.0–15.0)
Lymphocytes Relative: 21 %
Lymphs Abs: 1.6 10*3/uL (ref 0.7–4.0)
MCH: 26.1 pg (ref 26.0–34.0)
MCHC: 33.3 g/dL (ref 30.0–36.0)
MCV: 78.5 fL (ref 78.0–100.0)
Monocytes Absolute: 1 10*3/uL (ref 0.1–1.0)
Monocytes Relative: 13 %
Neutro Abs: 4.8 10*3/uL (ref 1.7–7.7)
Neutrophils Relative %: 63 %
Platelets: 214 10*3/uL (ref 150–400)
RBC: 5.44 MIL/uL — ABNORMAL HIGH (ref 3.87–5.11)
RDW: 16 % — ABNORMAL HIGH (ref 11.5–15.5)
WBC: 7.7 10*3/uL (ref 4.0–10.5)

## 2016-03-01 LAB — COMPREHENSIVE METABOLIC PANEL
ALT: 69 U/L — ABNORMAL HIGH (ref 14–54)
AST: 269 U/L — ABNORMAL HIGH (ref 15–41)
Albumin: 3.2 g/dL — ABNORMAL LOW (ref 3.5–5.0)
Alkaline Phosphatase: 265 U/L — ABNORMAL HIGH (ref 38–126)
Anion gap: 13 (ref 5–15)
BUN: 36 mg/dL — ABNORMAL HIGH (ref 6–20)
CO2: 29 mmol/L (ref 22–32)
Calcium: 9.1 mg/dL (ref 8.9–10.3)
Chloride: 95 mmol/L — ABNORMAL LOW (ref 101–111)
Creatinine, Ser: 1.7 mg/dL — ABNORMAL HIGH (ref 0.44–1.00)
GFR calc Af Amer: 36 mL/min — ABNORMAL LOW (ref >60)
GFR calc non Af Amer: 31 mL/min — ABNORMAL LOW (ref >60)
Glucose, Bld: 104 mg/dL — ABNORMAL HIGH (ref 65–99)
Potassium: 4.5 mmol/L (ref 3.5–5.1)
Sodium: 137 mmol/L (ref 135–145)
Total Bilirubin: 1.1 mg/dL (ref 0.3–1.2)
Total Protein: 7.9 g/dL (ref 6.5–8.1)

## 2016-03-01 LAB — PROTIME-INR
INR: 0.96 (ref 0.00–1.49)
PROTHROMBIN TIME: 13 s (ref 11.6–15.2)

## 2016-03-01 MED ORDER — FLUMAZENIL 0.5 MG/5ML IV SOLN
INTRAVENOUS | Status: AC
  Filled 2016-03-01: qty 5

## 2016-03-01 MED ORDER — MIDAZOLAM HCL 2 MG/2ML IJ SOLN
INTRAMUSCULAR | Status: AC | PRN
  Administered 2016-03-01 (×2): 1 mg via INTRAVENOUS

## 2016-03-01 MED ORDER — HYDROCODONE-ACETAMINOPHEN 5-325 MG PO TABS
1.0000 | ORAL_TABLET | ORAL | Status: DC | PRN
  Filled 2016-03-01: qty 2

## 2016-03-01 MED ORDER — SODIUM CHLORIDE 0.9 % IV SOLN
INTRAVENOUS | Status: DC
  Administered 2016-03-01: 12:00:00 via INTRAVENOUS

## 2016-03-01 MED ORDER — MIDAZOLAM HCL 2 MG/2ML IJ SOLN
INTRAMUSCULAR | Status: AC
  Filled 2016-03-01: qty 6

## 2016-03-01 MED ORDER — NALOXONE HCL 0.4 MG/ML IJ SOLN
INTRAMUSCULAR | Status: AC
  Filled 2016-03-01: qty 1

## 2016-03-01 MED ORDER — FENTANYL CITRATE (PF) 100 MCG/2ML IJ SOLN
INTRAMUSCULAR | Status: AC
  Filled 2016-03-01: qty 4

## 2016-03-01 MED ORDER — FENTANYL CITRATE (PF) 100 MCG/2ML IJ SOLN
INTRAMUSCULAR | Status: AC | PRN
  Administered 2016-03-01: 25 ug via INTRAVENOUS
  Administered 2016-03-01: 50 ug via INTRAVENOUS

## 2016-03-01 NOTE — Discharge Instructions (Signed)
Liver Biopsy The liver is a large organ in the upper right-hand side of your abdomen. A liver biopsy is a procedure in which a tissue sample is taken from the liver and examined under a microscope. The procedure is done to confirm a suspected problem. There are three types of liver biopsies:  Percutaneous. In this type, an incision is made in your abdomen. The sample is removed through the incision with a needle.  Laparoscopic. In this type, several incisions are made in the abdomen. A tiny camera is passed through one of the incisions to help guide the health care provider. The sample is removed through the other incision or incisions.  Transjugular. In this type, an incision is made in the neck. A tube is passed through the incision to the liver. The sample is removed through the tube with a needle. LET [facility] CARE PROVIDER KNOW ABOUT:  Any allergies you have.  All medicines you are taking, including vitamins, herbs, eye drops, creams, and over-the-counter medicines.  Previous problems you or members of your family have had with the use of anesthetics.  Any blood disorders you have.  Previous surgeries you have had.  Medical conditions you have.  Possibility of pregnancy, if this applies. RISKS AND COMPLICATIONS Generally, this is a safe procedure. However, problems can occur and include:  Bleeding.  Infection.  Bruising.  Collapsed lung.  Leak of digestive juices (bile) from the liver or gallbladder.  Problems with heart rhythm.  Pain at the biopsy site or in the right shoulder.  Low blood pressure (hypotension).  Injury to nearby organs or tissues. BEFORE THE PROCEDURE  Your health care provider may do some blood or urine tests. These will help your health care provider learn how well your kidneys and liver are working and how well your blood clots.  Ask your health care provider if you will be able to go home the day of the procedure. Arrange for someone to  take you home and stay with you for at least 24 hours.  Do not eat or drink anything after midnight on the night before the procedure or as directed by your health care provider.  Ask your health care provider about:  Changing or stopping your regular medicines. This is especially important if you are taking diabetes medicines or blood thinners.  Taking medicines such as aspirin and ibuprofen. These medicines can thin your blood. Do not take these medicines before your procedure if your health care provider asks you not to. PROCEDURE Regardless of the type of biopsy that will be done, you will have an IV line placed. Through this line, you will receive fluids and medicine to relax you. If you will be having a laparoscopic biopsy, you may also receive medicine through this line to make you sleep during the procedure (general anesthetic). Percutaneous Liver Biopsy  You will positioned on your back, with your right hand over your head.  A health care provider will locate your liver by tapping and pressing on the right side of your abdomen or with the help of an ultrasound machine or CT scan.  An area at the bottom of your last right rib will be numbed.  An incision will be made in the numbed area.  The biopsy needle will be inserted into the incision.  Several samples of liver tissue will be taken with the biopsy needle. You will be asked to hold your breath as each sample is taken. Laparoscopic Liver Biopsy  You will be  positioned on your back. °· Several small incisions will be made in your abdomen. °· Your doctor will pass a tiny camera through one incision. The camera will allow the liver to be viewed on a TV monitor in the operating room. °· Tools will be passed through the other incision or incisions. These tools will be used to remove samples of liver tissue. °Transjugular Liver Biopsy °· You will be positioned on your back on an X-ray table, with your head turned to your left. °· An  area on your neck just over your jugular vein will be numbed. °· An incision will be made in the numbed area. °· A tiny tube will be inserted through the incision. It will be pushed through the jugular vein to a blood vessel in the liver called the hepatic vein. °· Dye will be inserted through the tube, and X-rays will be taken. The dye will make the blood vessels in the liver light up on the X-rays. °· The biopsy needle will be pushed through the tube until it reaches the liver. °· Samples of liver tissue will be taken with the biopsy needle. °· The needle and the tube will be removed. °After the samples are obtained, the incision or incisions will be closed. °AFTER THE PROCEDURE °· You will be taken to a recovery area. °· You may have to lie on your right side for 1-2 hours. This will prevent bleeding from the biopsy site. °· Your progress will be watched. Your blood pressure, pulse, and the biopsy site will be checked often. °· You may have some pain or feel sick. If this happens, tell your health care provider. °· As you begin to feel better, you will be offered ice and beverages. °· You may be allowed to go home when the medicines have worn off and you can walk, drink, eat, and use the bathroom. °  °This information is not intended to replace advice given to you by your health care provider. Make sure you discuss any questions you have with your health care provider. °  °Document Released: 11/04/2003 Document Revised: 09/04/2014 Document Reviewed: 10/10/2013 °Elsevier Interactive Patient Education ©2016 [employer] °Liver Biopsy, Care After °Refer to this sheet in the next few weeks. These instructions provide you with information on caring for yourself after your procedure. Your health care provider may also give you more specific instructions. Your treatment has been planned according to current medical practices, but problems sometimes occur. Call your health care provider if you have any problems or  questions after your procedure. °WHAT TO EXPECT AFTER THE PROCEDURE °After your procedure, it is typical to have the following: °· A small amount of discomfort in the area where the biopsy was done and in the right shoulder or shoulder blade. °· A small amount of bruising around the area where the biopsy was done and on the skin over the liver. °· Sleepiness and fatigue for the rest of the day. °HOME CARE INSTRUCTIONS  °· Rest at home for 1-2 days or as directed by your health care provider. °· Have a friend or family member stay with you for at least 24 hours. °· Because of the medicines used during the procedure, you should not do the following things in the first 24 hours: °· Drive. °· Use machinery. °· Be responsible for the care of other people. °· Sign legal documents. °· Take a bath or shower. °· There are many different ways to close and cover an incision, including stitches,   skin glue, and adhesive strips. Follow your health care provider's instructions on:  Incision care.  Bandage (dressing) changes and removal.  Incision closure removal.  Do not drink alcohol in the first week.  Do not lift more than 5 pounds or play contact sports for 2 weeks after this test.  Take medicines only as directed by your health care provider. Do not take medicine containing aspirin or non-steroidal anti-inflammatory medicines such as ibuprofen for 1 week after this test.  It is your responsibility to get your test results. SEEK MEDICAL CARE IF:   You have increased bleeding from an incision that results in more than a small spot of blood.  You have redness, swelling, or increasing pain in any incisions.  You notice a discharge or a bad smell coming from any of your incisions.  You have a fever or chills. SEEK IMMEDIATE MEDICAL CARE IF:   You develop swelling, bloating, or pain in your abdomen.  You become dizzy or faint.  You develop a rash.  You are nauseous or vomit.  You have difficulty  breathing, feel short of breath, or feel faint.  You develop chest pain.  You have problems with your speech or vision.  You have trouble balancing or moving your arms or legs.   Moderate Conscious Sedation, Adult, Care After Refer to this sheet in the next few weeks. These instructions provide you with information on caring for yourself after your procedure. Your health care provider may also give you more specific instructions. Your treatment has been planned according to current medical practices, but problems sometimes occur. Call your health care provider if you have any problems or questions after your procedure. WHAT TO EXPECT AFTER THE PROCEDURE  After your procedure: You may feel sleepy, clumsy, and have poor balance for several hours. Vomiting may occur if you eat too soon after the procedure. HOME CARE INSTRUCTIONS Do not participate in any activities where you could become injured for at least 24 hours. Do not: Drive. Swim. Ride a bicycle. Operate heavy machinery. Cook. Use power tools. Climb ladders. Work from a high place. Do not make important decisions or sign legal documents until you are improved. If you vomit, drink water, juice, or soup when you can drink without vomiting. Make sure you have little or no nausea before eating solid foods. Only take over-the-counter or prescription medicines for pain, discomfort, or fever as directed by your health care provider. Make sure you and your family fully understand everything about the medicines given to you, including what side effects may occur. You should not drink alcohol, take sleeping pills, or take medicines that cause drowsiness for at least 24 hours. If you smoke, do not smoke without supervision. If you are feeling better, you may resume normal activities 24 hours after you were sedated. Keep all appointments with your health care provider. SEEK MEDICAL CARE IF: Your skin is pale or bluish in color. You  continue to feel nauseous or vomit. Your pain is getting worse and is not helped by medicine. You have bleeding or swelling. You are still sleepy or feeling clumsy after 24 hours. SEEK IMMEDIATE MEDICAL CARE IF: You develop a rash. You have difficulty breathing. You develop any type of allergic problem. You have a fever. MAKE SURE YOU: Understand these instructions. Will watch your condition. Will get help right away if you are not doing well or get worse.   This information is not intended to replace advice given to you by your  health care provider. Make sure you discuss any questions you have with your health care provider.   Document Released: 06/04/2013 Document Revised: 09/04/2014 Document Reviewed: 06/04/2013 Elsevier Interactive Patient Education 2016 [place] may have blood tests done. These tests can help show how well your kidneys and liver are working. They can also show how well your blood clots.  A physical exam will be done.  Only take medicines as directed by your health care provider. You may need to stop taking medicines (such as blood thinners, aspirin, or nonsteroidal anti-inflammatory drugs) before the procedure.   Do not eat or drink at least 6 hours before the procedure or as directed by your health care provider.  Arrange for a responsible adult, family member, or friend to take you home after the procedure. He or she should stay with you for at least 24 hours after the procedure, until the medicine has worn off. PROCEDURE   An intravenous (IV) catheter will be inserted into one of your veins. Medicine will be able to flow directly into your body through this catheter. You may be given medicine through this tube to help prevent pain and help you relax.  The medical or dental procedure will be done. AFTER THE PROCEDURE  You will stay in a recovery area until the medicine has worn off. Your blood pressure and pulse will be checked.    Depending on the procedure you had, you may be allowed to go home when you can tolerate liquids and your pain is under control.   This information is not intended to replace advice given to you by your health care provider. Make sure you discuss any questions you have with your health care provider.   Document Released: 05/09/2001 Document Revised: 09/04/2014 Document Reviewed: 04/21/2013 Elsevier Interactive Patient Education [employer].

## 2016-03-01 NOTE — Procedures (Signed)
US liver core bx 68E x3 No complication No blood loss. See complete dictation in [facility].

## 2016-03-01 NOTE — Consult Note (Signed)
Chief Complaint: Patient was seen in consultation today for image guided liver lesion biopsy  Referring Physician(s): [place]  Supervising Physician: Arne Cleveland  Patient Status: Outpatient  History of Present Illness: Monique Kane is a 65 y.o. female , former smoker, with sig PMH as listed below and recent imaging revealing mediastinal/left hilar adenopathy with a small left upper lobe lung mass as well as multiple liver lesions. She presents today for image guided liver lesion biopsy for further evaluation.   Past Medical History  Diagnosis Date  . CHF (congestive heart failure) ([place])   . Hypertension   . COPD (chronic obstructive pulmonary disease) ([place])   . Angina   . Dysrhythmia 09/12/11    ventricular paced  . Shortness of breath on exertion   . Chronic lower back pain   . Headache(784.0)   . Dizziness   . Gout   . Hx: UTI (urinary tract infection)   . Cardiac defibrillator in place   . Pacemaker   . Bronchitis, chronic ([place])   . Refusal of blood transfusions as patient is Jehovah's Witness   . Arthritis     hx of gout  . Unspecified constipation/ chronic 06/06/2013    Past Surgical History  Procedure Laterality Date  . 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 Joint replacement    . Tubal ligation    . Insert / replace / remove pacemaker  05/2007    w/defibrillator    Allergies: Review of patient's allergies indicates no known allergies.  Medications: Prior to Admission medications   Medication Sig Start Date End Date Taking? Authorizing Provider  acetaminophen (TYLENOL EX ST ARTHRITIS PAIN) 500 MG tablet Take 1,000-1,500 mg by mouth every 6 (six) hours as needed for mild pain or headache.   Yes Historical Provider, MD  albuterol (PROVENTIL HFA;VENTOLIN HFA) 108 (90 BASE) MCG/ACT inhaler Inhale 2 puffs into the lungs every 6 (six) hours as needed for wheezing or shortness of breath.   Yes Historical Provider, MD  albuterol (PROVENTIL)  (2.5 MG/3ML) 0.083% nebulizer solution Take 2.5 mg by nebulization every 6 (six) hours as needed for wheezing or shortness of breath.   Yes Historical Provider, MD  allopurinol (ZYLOPRIM) 100 MG tablet Take 100 mg by mouth every morning.    Yes Historical Provider, MD  aspirin EC 81 MG tablet Take 81 mg by mouth every morning.    Yes Historical Provider, MD  fluticasone (FLONASE) 50 MCG/ACT nasal spray Place 1 spray into both nostrils daily as needed for rhinitis.    Yes Historical Provider, MD  HYDROmorphone (DILAUDID) 2 MG tablet Take 1 tablet (2 mg total) by mouth every 4 (four) hours as needed for severe pain. 02/22/16  Yes Heath Lark, MD  metoprolol (TOPROL-XL) 200 MG 24 hr tablet Take 200 mg by mouth daily. 08/27/15  Yes Historical Provider, MD  omeprazole (PRILOSEC) 20 MG capsule Take 20 mg by mouth daily.    Yes Historical Provider, MD  pravastatin (PRAVACHOL) 20 MG tablet Take 20 mg by mouth every morning.    Yes Historical Provider, MD  RESTASIS 0.05 % ophthalmic emulsion place drop in both eyes twice daily 10/01/15  Yes Historical Provider, MD  SYMBICORT 160-4.5 MCG/ACT inhaler INHALE 2 PUFFS TWICE DAILY. RINSE AFTER EACH USE 09/30/15  Yes Historical Provider, MD  torsemide (DEMADEX) 20 MG tablet Take 20 mg by mouth daily. MAY TAKE 1 TABLET IN THE EVENING IF FLUID GAIN OF 2LBS OR MORE 09/21/15  Yes Historical Provider, MD  umeclidinium bromide (INCRUSE ELLIPTA) 62.5 MCG/INH AEPB Inhale 1 puff into the lungs daily.   Yes Historical Provider, MD     Family History  Problem Relation Age of Onset  . COPD Mother   . COPD Father     Social History   Social History  . Marital Status: Single    Spouse Name: N/A  . Number of Children: N/A  . Years of Education: N/A   Social History Main Topics  . Smoking status: Former Smoker -- 0.50 packs/day for 40 years    Types: Cigarettes    Quit date: 07/09/2009  . Smokeless tobacco: Never Used     Comment: quit smoking cigarettes ~ 2005  .  Alcohol Use: No  . Drug Use: No  . 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 Daughter Kenney Houseman daugher   Other Topics Concern  . None   Social History Narrative      Review of Systems  Constitutional: Positive for fatigue. Negative for fever and chills.  Respiratory: Positive for cough and shortness of breath.   Cardiovascular: Negative for chest pain.  Gastrointestinal: Positive for abdominal pain. Negative for nausea, vomiting and blood in stool.  Genitourinary: Negative for dysuria and hematuria.  Musculoskeletal: Positive for back pain.    Vital Signs: Blood pressure 146/84, temperature 98.4, heart rate 86, respirations 16, O2 sat 97% room air   Physical Exam  Constitutional: She is oriented to person, place, and time. She appears well-developed and well-nourished.  Cardiovascular: Normal rate and regular rhythm.   Left chest wall AICD/pacer  Pulmonary/Chest:  Distant but clear breath sounds bilaterally  Abdominal: Soft. Bowel sounds are normal. There is tenderness.  obese  Musculoskeletal:  Left shoulder/arm discomfort; reports left arm edema  Neurological: She is alert and oriented to person, place, and time.    Mallampati Score:     Imaging: Ct Humerus Left W Contrast  02/07/2016  CLINICAL DATA:  Left arm mass. Soft tissue swelling from the proximal shoulder to the lower humerus. Progressive pain for 6 months. EXAM: CT OF THE LEFT HUMERUS WITH CONTRAST; CT OF THE LEFT SHOULDER WITH CONTRAST TECHNIQUE: Multidetector CT imaging was performed following the standard protocol during bolus administration of intravenous contrast. CONTRAST:  137m ISOVUE-300 IOPAMIDOL (ISOVUE-300) INJECTION 61% COMPARISON:  Radiographs dated 01/20/2016 FINDINGS: CT scan of the left shoulder: The bones and soft tissues of the left shoulder appear normal. Pacemaker just above the left breast. The visualized bones of the thorax appear normal except for  minimal degenerative changes in the thoracic spine. However, the patient has mediastinal and left hilar adenopathy with a small irregular mass in the superior segment of left lower lobe measuring 14 x 9 mm. Left hilar nodal mass measures 28 mm on image 114 of series 7. Subcarinal lymph node mass as a minimal dimension of 2.1 cm. Scans through the upper abdomen demonstrate multiple metastatic lesions in the right and left lobes of the liver. The liver is incompletely visualized. Heart size is normal. Coronary artery calcification. Pacemaker in place. CT scan of the left humerus: Left humerus appears normal. No soft tissue mass. The patient is morbidly obese and has prominent subcutaneous fat around the left shoulder and in the left upper arm but there is no worrisome soft tissue mass in the left arm. IMPRESSION: CT scan of the shoulder: Negative appearance of the left shoulder. Mediastinal and left hilar adenopathy with a small left  upper lobe lung mass suggesting metastatic carcinoma of the lung. Multiple metastatic lesions in the liver. CT scan of the left humerus: Normal left humerus and soft tissues of the left upper arm. Prominent subcutaneous fat without a discrete mass in the arm. Electronically Signed   By: Lorriane Shire M.D.   On: 02/07/2016 16:06   Ct Abdomen Pelvis W Contrast  02/23/2016  CLINICAL DATA:  Lung cancer with liver metastasis. Diffuse abdominal pain. EXAM: CT ABDOMEN AND PELVIS WITH CONTRAST TECHNIQUE: Multidetector CT imaging of the abdomen and pelvis was performed using the standard protocol following bolus administration of intravenous contrast. CONTRAST:  137m ISOVUE-300 IOPAMIDOL (ISOVUE-300) INJECTION 61% COMPARISON:  Shoulder CT 02/07/2016, CT chest 12/05/2011 FINDINGS: Lower chest: Lung bases are clear. Hepatobiliary: Multiple low-density lesions in the RIGHT hepatic lobe consistent with hepatic metastasis. Example lesion in the anterior central LEFT hepatic lobe measures 2.7 cm  image 15, series 3. Lesion in the inferior RIGHT hepatic lobe measures 3.4 cm (image 35 series 3). Approximately 15 lesion upper lobe. Liver has a nodular contour likely representing underlying metastatic lesions. No biliary duct dilatation.  Gallbladder normal Pancreas: Pancreas is normal. No ductal dilatation. No pancreatic inflammation. Spleen: Normal spleen Adrenals/urinary tract: Small nodule of the LEFT adrenal gland measures 8 mm. Nodule appears unchanged from CT 2013. RIGHT adrenal glands normal. Kidneys normal. Ureters and bladder normal. Stomach/Bowel: Stomach, small bowel, appendix, and cecum are normal. The colon and rectosigmoid colon are normal. Vascular/Lymphatic: Abdominal aorta is normal caliber with atherosclerotic calcification. There is no retroperitoneal or periportal lymphadenopathy. No pelvic lymphadenopathy. Reproductive: Uterus and ovaries are normal. Other: No free fluid the pelvis.  No peritoneal nodularity. Musculoskeletal: No aggressive osseous lesion. IMPRESSION: 1. Widespread hepatic metastasis. 2. No primary carcinoma identified in the abdomen or pelvis. Electronically Signed   By: SSuzy BouchardM.D.   On: 02/23/2016 15:48   Ct Shoulder Left W Contrast  02/07/2016  CLINICAL DATA:  Left arm mass. Soft tissue swelling from the proximal shoulder to the lower humerus. Progressive pain for 6 months. EXAM: CT OF THE LEFT HUMERUS WITH CONTRAST; CT OF THE LEFT SHOULDER WITH CONTRAST TECHNIQUE: Multidetector CT imaging was performed following the standard protocol during bolus administration of intravenous contrast. CONTRAST:  1027mISOVUE-300 IOPAMIDOL (ISOVUE-300) INJECTION 61% COMPARISON:  Radiographs dated 01/20/2016 FINDINGS: CT scan of the left shoulder: The bones and soft tissues of the left shoulder appear normal. Pacemaker just above the left breast. The visualized bones of the thorax appear normal except for minimal degenerative changes in the thoracic spine. However, the  patient has mediastinal and left hilar adenopathy with a small irregular mass in the superior segment of left lower lobe measuring 14 x 9 mm. Left hilar nodal mass measures 28 mm on image 114 of series 7. Subcarinal lymph node mass as a minimal dimension of 2.1 cm. Scans through the upper abdomen demonstrate multiple metastatic lesions in the right and left lobes of the liver. The liver is incompletely visualized. Heart size is normal. Coronary artery calcification. Pacemaker in place. CT scan of the left humerus: Left humerus appears normal. No soft tissue mass. The patient is morbidly obese and has prominent subcutaneous fat around the left shoulder and in the left upper arm but there is no worrisome soft tissue mass in the left arm. IMPRESSION: CT scan of the shoulder: Negative appearance of the left shoulder. Mediastinal and left hilar adenopathy with a small left upper lobe lung mass suggesting metastatic carcinoma of the lung.  Multiple metastatic lesions in the liver. CT scan of the left humerus: Normal left humerus and soft tissues of the left upper arm. Prominent subcutaneous fat without a discrete mass in the arm. Electronically Signed   By: Lorriane Shire M.D.   On: 02/07/2016 16:06    Labs:  CBC:  Recent Labs  10/10/15 1413 02/16/16 1202  WBC 10.3 10.5*  HGB 14.3 14.9  HCT 43.6 45.3  PLT 259 272    COAGS:  Recent Labs  02/16/16 1202  INR 1.10*    BMP:  Recent Labs  10/10/15 1413 02/16/16 1202  NA 140 141  K 5.1 3.9  CL 104  --   CO2 27 26  GLUCOSE 116* 119  BUN 20 19.7  CALCIUM 9.2 9.7  CREATININE 1.18* 1.4*  GFRNONAA 48*  --   GFRAA 56*  --     LIVER FUNCTION TESTS:  Recent Labs  02/16/16 1202  BILITOT 0.62  AST 73*  ALT 32  ALKPHOS 140  PROT 8.4*  ALBUMIN 3.2*    TUMOR MARKERS: No results for input(s): AFPTM, CEA, CA199, CHROMGRNA in the last 8760 hours.  Assessment and Plan: 65 y.o. female , former smoker, with sig PMH including CHF,  hypertension, COPD, pacer/defibrillator and recent imaging revealing mediastinal/left hilar adenopathy with a small left upper lobe lung mass as well as multiple liver lesions. She presents today for image guided liver lesion biopsy for further evaluation. Risks and benefits discussed with the patient including, but not limited to bleeding, infection, damage to adjacent structures or low yield requiring additional tests.All of the patient's questions were answered, patient is agreeable to proceed.Consent signed and in chart.      Thank you for this interesting consult.  I greatly enjoyed meeting Monique Kane and look forward to participating in their care.  A copy of this report was sent to the requesting provider on this date.  Electronically Signed: D. Rowe Robert 03/01/2016, 12:01 PM   I spent a total of 25 minutes in face to face in clinical consultation, greater than 50% of which was counseling/coordinating care for image guided liver lesion biopsy

## 2016-03-03 ENCOUNTER — Ambulatory Visit: Payer: Medicaid Other | Admitting: Hematology and Oncology

## 2016-03-03 ENCOUNTER — Telehealth: Payer: Self-pay | Admitting: Hematology and Oncology

## 2016-03-03 NOTE — Telephone Encounter (Signed)
S.w. Pt and moved appt to 7.11 per mD request...the patient ok and aware of d.t

## 2016-03-06 ENCOUNTER — Telehealth: Payer: Self-pay | Admitting: *Deleted

## 2016-03-06 MED ORDER — HYDROMORPHONE HCL 4 MG PO TABS: 4.0000 mg | ORAL_TABLET | ORAL | Status: AC | PRN

## 2016-03-06 NOTE — Telephone Encounter (Signed)
Pt's grand-daughter picked up Rx.  Instructed her to inform pt of dose increase from 2 mg to 4 mg.  She verbalized understanding.

## 2016-03-06 NOTE — Telephone Encounter (Signed)
Pt states needs refill on her pain medication for abd pain. She is taking about 5 tablets of Dilaudid 2 mg per day.  She is aware she has appt to see Dr. Alvy Bimler tomorrow but she is going to see if she can get someone to come up to our office today to pick up Rx as she is almost out.

## 2016-03-07 ENCOUNTER — Encounter: Payer: Self-pay | Admitting: Hematology and Oncology

## 2016-03-07 ENCOUNTER — Telehealth: Payer: Self-pay | Admitting: *Deleted

## 2016-03-07 ENCOUNTER — Ambulatory Visit (HOSPITAL_BASED_OUTPATIENT_CLINIC_OR_DEPARTMENT_OTHER): Payer: Medicaid Other | Admitting: Hematology and Oncology

## 2016-03-07 VITALS — BP 104/55 | HR 79 | Temp 98.1°F | Resp 20 | Wt 264.0 lb

## 2016-03-07 DIAGNOSIS — G893 Neoplasm related pain (acute) (chronic): Secondary | ICD-10-CM

## 2016-03-07 DIAGNOSIS — C3492 Malignant neoplasm of unspecified part of left bronchus or lung: Secondary | ICD-10-CM | POA: Diagnosis present

## 2016-03-07 DIAGNOSIS — C787 Secondary malignant neoplasm of liver and intrahepatic bile duct: Secondary | ICD-10-CM | POA: Diagnosis not present

## 2016-03-07 DIAGNOSIS — Z315 Encounter for genetic counseling: Secondary | ICD-10-CM

## 2016-03-07 DIAGNOSIS — Z515 Encounter for palliative care: Secondary | ICD-10-CM | POA: Insufficient documentation

## 2016-03-07 NOTE — Progress Notes (Signed)
[place] OFFICE PROGRESS NOTE  Patient Care Team: Pcp Not In System as PCP - General [name], PA-C as Referring Physician (Pulmonary Disease) Meredith Pel, MD as Consulting Physician (Orthopedic Surgery)  SUMMARY OF ONCOLOGIC HISTORY:   Carcinoma of left lung ([place])   10/10/2015 Imaging CT angiogram showed no evidence of PE or lung mass   02/07/2016 Imaging Mediastinal and left hilar adenopathy with a small left upper lobe lung mass suggesting metastatic carcinoma of the lung. Multiple metastatic lesions in the liver. CT scan of the left humerus: Normal left humerus    02/23/2016 Imaging CT imaging confirmed Widespread hepatic metastasis. 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 She returns with her daughter, Sharyn Lull to review test results She continues to have severe left arm pain and RUQ pain from recent liver biopsy She has poor appetite and had lost 5 pounds since I saw her  REVIEW OF SYSTEMS:   Constitutional: Denies fevers, chills  Eyes: Denies blurriness of vision Ears, nose, mouth, throat, and face: Denies mucositis or sore throat Respiratory: Denies cough, dyspnea or wheezes. She uses oxygen Cardiovascular: Denies palpitation, chest discomfort or lower extremity swelling Gastrointestinal:  Denies nausea, heartburn or change in bowel habits Skin: Denies abnormal skin rashes Lymphatics: Denies new lymphadenopathy or easy bruising Neurological:Denies numbness, tingling or new weaknesses Behavioral/Psych: Mood is stable, no new changes  All other systems were reviewed with the patient and are negative.  I have reviewed the past medical history, past surgical history, social history and family history with the  patient and they are unchanged from previous note.  ALLERGIES:  has No Known Allergies.  MEDICATIONS:  Current Outpatient Prescriptions  Medication Sig Dispense Refill  . acetaminophen (TYLENOL EX ST ARTHRITIS PAIN) 500 MG tablet Take 1,000-1,500 mg by mouth every 6 (six) hours as needed for mild pain or headache.    . albuterol (PROVENTIL HFA;VENTOLIN HFA) 108 (90 BASE) MCG/ACT inhaler Inhale 2 puffs into the lungs every 6 (six) hours as needed for wheezing or shortness of breath.    Marland Kitchen albuterol (PROVENTIL) (2.5 MG/3ML) 0.083% nebulizer solution Take 2.5 mg by nebulization every 6 (six) hours as needed for wheezing or shortness of breath.    . allopurinol (ZYLOPRIM) 100 MG tablet Take 100 mg by mouth every morning.     Marland Kitchen aspirin EC 81 MG tablet Take 81 mg by mouth every morning.     . fluticasone (FLONASE) 50 MCG/ACT nasal spray Place 1 spray into both nostrils daily as needed for rhinitis.     Marland Kitchen HYDROmorphone (DILAUDID) 4 MG tablet Take 1 tablet (4 mg total) by mouth every 4 (four) hours as needed for severe pain. 60 tablet 0  . metoprolol (TOPROL-XL) 200 MG 24 hr tablet Take 200 mg by mouth daily.  6  . omeprazole (PRILOSEC) 20 MG capsule Take 20 mg by mouth daily.     . pravastatin (PRAVACHOL) 20 MG tablet Take 20 mg by mouth every morning.     . RESTASIS 0.05 % ophthalmic emulsion place drop in both eyes twice daily  1  . SYMBICORT 160-4.5 MCG/ACT inhaler INHALE 2 PUFFS TWICE DAILY. RINSE AFTER EACH USE  5  . torsemide (DEMADEX) 20 MG tablet Take 20 mg by mouth daily. MAY TAKE 1  TABLET IN THE EVENING IF FLUID GAIN OF 2LBS OR MORE    . umeclidinium bromide (INCRUSE ELLIPTA) 62.5 MCG/INH AEPB Inhale 1 puff into the lungs daily.     No current facility-administered medications for this visit.    PHYSICAL EXAMINATION: ECOG PERFORMANCE STATUS: 2   Filed Vitals:   03/07/16 1331  BP: 104/55  Pulse: 79  Temp: 98.1 F (36.7 C)  Resp: 20   Filed Weights   03/07/16 1331  Weight: 264  lb (119.75 kg)    GENERAL:alert, no distress and comfortable. She is morbidly obese. SKIN: skin color, texture, turgor are normal, no rashes or significant lesions EYES: normal, Conjunctiva are pink and non-injected, sclera clear Musculoskeletal:no cyanosis of digits and no clubbing  NEURO: alert & oriented x 3 with fluent speech, no focal motor/sensory deficits  LABORATORY DATA:  I have reviewed the data as listed    Component Value Date/Time   NA 137 03/01/2016 1140   NA 141 02/16/2016 1202   K 4.5 03/01/2016 1140   K 3.9 02/16/2016 1202   CL 95* 03/01/2016 1140   CO2 29 03/01/2016 1140   CO2 26 02/16/2016 1202   GLUCOSE 104* 03/01/2016 1140   GLUCOSE 119 02/16/2016 1202   BUN 36* 03/01/2016 1140   BUN 19.7 02/16/2016 1202   CREATININE 1.70* 03/01/2016 1140   CREATININE 1.4* 02/16/2016 1202   CALCIUM 9.1 03/01/2016 1140   CALCIUM 9.7 02/16/2016 1202   PROT 7.9 03/01/2016 1140   PROT 8.4* 02/16/2016 1202   ALBUMIN 3.2* 03/01/2016 1140   ALBUMIN 3.2* 02/16/2016 1202   AST 269* 03/01/2016 1140   AST 73* 02/16/2016 1202   ALT 69* 03/01/2016 1140   ALT 32 02/16/2016 1202   ALKPHOS 265* 03/01/2016 1140   ALKPHOS 140 02/16/2016 1202   BILITOT 1.1 03/01/2016 1140   BILITOT 0.62 02/16/2016 1202   GFRNONAA 31* 03/01/2016 1140   GFRAA 36* 03/01/2016 1140    No results found for: SPEP, UPEP  Lab Results  Component Value Date   WBC 7.7 03/01/2016   NEUTROABS 4.8 03/01/2016   HGB 14.2 03/01/2016   HCT 42.7 03/01/2016   MCV 78.5 03/01/2016   PLT 214 03/01/2016      Chemistry      Component Value Date/Time   NA 137 03/01/2016 1140   NA 141 02/16/2016 1202   K 4.5 03/01/2016 1140   K 3.9 02/16/2016 1202   CL 95* 03/01/2016 1140   CO2 29 03/01/2016 1140   CO2 26 02/16/2016 1202   BUN 36* 03/01/2016 1140   BUN 19.7 02/16/2016 1202   CREATININE 1.70* 03/01/2016 1140   CREATININE 1.4* 02/16/2016 1202      Component Value Date/Time   CALCIUM 9.1 03/01/2016 1140    CALCIUM 9.7 02/16/2016 1202   ALKPHOS 265* 03/01/2016 1140   ALKPHOS 140 02/16/2016 1202   AST 269* 03/01/2016 1140   AST 73* 02/16/2016 1202   ALT 69* 03/01/2016 1140   ALT 32 02/16/2016 1202   BILITOT 1.1 03/01/2016 1140   BILITOT 0.62 02/16/2016 1202     I review biopsy result  RADIOGRAPHIC STUDIES:I reviewed CT imaging result with her daughter I have personally reviewed the radiological images as listed and agreed with the findings in the report.    ASSESSMENT & PLAN:  Carcinoma of left lung ([place]) I have a long discussion with the patient in the presence of her daughter, Sharyn Lull. She has stage IV metastatic lung cancer. This cancer was not  present on her February 2017 CT scan. This suggests that she has a very aggressive cancer due to significant growth within a span of 4 months. The patient has significant major comorbidities including morbid obesity, cardiomyopathy, severe COPD on oxygen therapy and poor baseline performance status. I doubt she can tolerate systemic chemotherapy. The patient is in agreement not to pursue systemic treatment. She agreed to palliative care and hospice referral.  Metastasis to liver [facility]) I suspect this has caused referred shoulder pain As above, she will be referred for hospice  Cancer associated pain Interestingly, she has referred pain over the left arm without local pathology to explain her left arm pain from CT. This is related to cancerin her liver causing referred pain I refilled her pain medications yesterday I recommend low-dose hydromorphone to take as needed for pain. I warned her about risk of sedation and constipation with pain medicine I will consult palliative care and hospice for symptom management  Quality of life palliative care encounter She has poor quality of life The patient is aware she has stage IV disease and treatment is strictly palliative. We discussed importance of Advanced Directives and Living will. We  discussed CODE STATUS; I recommend DNR Patient became very tearful. We discussed prognosis She does not want to hear about prognosis Her daughter, Sharyn Lull is aware of poor prognosis, likely less than 6 months She agreed for home base hospice care   Orders Placed This Encounter  Procedures  . Ambulatory referral to Hospice    Referral Priority:  Routine    Referral Type:  Consultation    Referral Reason:  Specialty Services Required    Requested Specialty:  Hospice Services    Number of Visits Requested:  1   All questions were answered. The patient knows to call the clinic with any problems, questions or concerns. No barriers to learning was detected. I spent 25 minutes counseling the patient face to face. The total time spent in the appointment was 30 minutes and more than 50% was on counseling and review of test results     [facility], [place], MD 03/07/2016 5:25 PM

## 2016-03-07 NOTE — Telephone Encounter (Signed)
Called [facility] and s/w Amy.  Hospice referral made,  Dr. Alvy Bimler will be the Attending MD and requests to have Hospice orders enacted.  Requested urgent admission tomorrow due to pain management needs if possible.

## 2016-03-07 NOTE — Telephone Encounter (Signed)
-----   Message from Heath Lark, MD sent at 03/07/2016  1:48 PM EDT ----- Regarding: hospice I placed consult for hospice Please call and ask them for symptom management as well. She has severe cancer pain

## 2016-03-07 NOTE — Assessment & Plan Note (Signed)
She has poor quality of life The patient is aware she has stage IV disease and treatment is strictly palliative. We discussed importance of Advanced Directives and Living will. We discussed CODE STATUS; I recommend DNR Patient became very tearful. We discussed prognosis She does not want to hear about prognosis Her daughter, Monique Kane is aware of poor prognosis, likely less than 6 months She agreed for home base hospice care

## 2016-03-07 NOTE — Assessment & Plan Note (Signed)
Interestingly, she has referred pain over the left arm without local pathology to explain her left arm pain from CT. This is related to cancerin her liver causing referred pain I refilled her pain medications yesterday I recommend low-dose hydromorphone to take as needed for pain. I warned her about risk of sedation and constipation with pain medicine I will consult palliative care and hospice for symptom management

## 2016-03-07 NOTE — Assessment & Plan Note (Signed)
I suspect this has caused referred shoulder pain As above, she will be referred for hospice

## 2016-03-07 NOTE — Assessment & Plan Note (Signed)
I have a long discussion with the patient in the presence of her daughter, Sharyn Lull. She has stage IV metastatic lung cancer. This cancer was not present on her February 2017 CT scan. This suggests that she has a very aggressive cancer due to significant growth within a span of 4 months. The patient has significant major comorbidities including morbid obesity, cardiomyopathy, severe COPD on oxygen therapy and poor baseline performance status. I doubt she can tolerate systemic chemotherapy. The patient is in agreement not to pursue systemic treatment. She agreed to palliative care and hospice referral.

## 2016-03-10 ENCOUNTER — Telehealth: Payer: Self-pay | Admitting: *Deleted

## 2016-03-10 NOTE — Telephone Encounter (Signed)
Call from Joline Maxcy, RN at [facility].  States pt transferring to [facility] and Market researcher at [facility] will be Attending.   Dr. Alvy Bimler aware ok w/ Transfer.

## 2016-03-28 DEATH — deceased
